# Patient Record
Sex: Female | Born: 1960 | Race: Black or African American | Hispanic: No | Smoking: Former smoker
Health system: Southern US, Community
[De-identification: ages and names within clinical notes are randomized; demographics above are authoritative.]

## PROBLEM LIST (undated history)

## (undated) DIAGNOSIS — M109 Gout, unspecified: Secondary | ICD-10-CM

## (undated) DIAGNOSIS — G4730 Sleep apnea, unspecified: Secondary | ICD-10-CM

## (undated) DIAGNOSIS — K469 Unspecified abdominal hernia without obstruction or gangrene: Secondary | ICD-10-CM

## (undated) DIAGNOSIS — N189 Chronic kidney disease, unspecified: Secondary | ICD-10-CM

## (undated) DIAGNOSIS — E039 Hypothyroidism, unspecified: Secondary | ICD-10-CM

## (undated) DIAGNOSIS — I708 Atherosclerosis of other arteries: Secondary | ICD-10-CM

## (undated) DIAGNOSIS — Z227 Latent tuberculosis: Secondary | ICD-10-CM

## (undated) DIAGNOSIS — I2510 Atherosclerotic heart disease of native coronary artery without angina pectoris: Secondary | ICD-10-CM

## (undated) DIAGNOSIS — R011 Cardiac murmur, unspecified: Secondary | ICD-10-CM

## (undated) DIAGNOSIS — M5136 Other intervertebral disc degeneration, lumbar region: Secondary | ICD-10-CM

## (undated) DIAGNOSIS — E785 Hyperlipidemia, unspecified: Secondary | ICD-10-CM

## (undated) DIAGNOSIS — M1990 Unspecified osteoarthritis, unspecified site: Secondary | ICD-10-CM

## (undated) DIAGNOSIS — I700 Atherosclerosis of aorta: Secondary | ICD-10-CM

## (undated) DIAGNOSIS — I272 Pulmonary hypertension, unspecified: Secondary | ICD-10-CM

## (undated) DIAGNOSIS — I10 Essential (primary) hypertension: Secondary | ICD-10-CM

## (undated) DIAGNOSIS — G459 Transient cerebral ischemic attack, unspecified: Secondary | ICD-10-CM

## (undated) DIAGNOSIS — I639 Cerebral infarction, unspecified: Secondary | ICD-10-CM

## (undated) DIAGNOSIS — K429 Umbilical hernia without obstruction or gangrene: Secondary | ICD-10-CM

## (undated) DIAGNOSIS — E079 Disorder of thyroid, unspecified: Secondary | ICD-10-CM

## (undated) DIAGNOSIS — N186 End stage renal disease: Secondary | ICD-10-CM

## (undated) DIAGNOSIS — D631 Anemia in chronic kidney disease: Secondary | ICD-10-CM

## (undated) DIAGNOSIS — M51369 Other intervertebral disc degeneration, lumbar region without mention of lumbar back pain or lower extremity pain: Secondary | ICD-10-CM

## (undated) DIAGNOSIS — K219 Gastro-esophageal reflux disease without esophagitis: Secondary | ICD-10-CM

## (undated) DIAGNOSIS — Z992 Dependence on renal dialysis: Secondary | ICD-10-CM

## (undated) DIAGNOSIS — R0600 Dyspnea, unspecified: Secondary | ICD-10-CM

## (undated) DIAGNOSIS — D649 Anemia, unspecified: Secondary | ICD-10-CM

## (undated) HISTORY — DX: Hyperlipidemia, unspecified: E78.5

## (undated) HISTORY — DX: Chronic kidney disease, unspecified: N18.9

## (undated) HISTORY — PX: ABDOMINAL HYSTERECTOMY: SHX81

## (undated) HISTORY — DX: Essential (primary) hypertension: I10

## (undated) HISTORY — DX: Disorder of thyroid, unspecified: E07.9

## (undated) HISTORY — PX: HERNIA REPAIR: SHX51

## (undated) HISTORY — DX: Cerebral infarction, unspecified: I63.9

## (undated) HISTORY — DX: End stage renal disease: N18.6

## (undated) HISTORY — DX: Unspecified abdominal hernia without obstruction or gangrene: K46.9

## (undated) HISTORY — PX: PARTIAL HYSTERECTOMY: SHX80

---

## 1985-05-30 DIAGNOSIS — N051 Unspecified nephritic syndrome with focal and segmental glomerular lesions: Secondary | ICD-10-CM

## 1985-05-30 HISTORY — DX: Unspecified nephritic syndrome with focal and segmental glomerular lesions: N05.1

## 2004-09-29 ENCOUNTER — Ambulatory Visit: Payer: Self-pay

## 2004-11-18 ENCOUNTER — Ambulatory Visit: Payer: Self-pay | Admitting: Surgery

## 2007-01-02 ENCOUNTER — Emergency Department: Payer: Self-pay | Admitting: Emergency Medicine

## 2010-10-13 ENCOUNTER — Ambulatory Visit: Payer: Self-pay | Admitting: Internal Medicine

## 2010-11-11 ENCOUNTER — Ambulatory Visit: Payer: Self-pay | Admitting: Internal Medicine

## 2011-09-29 ENCOUNTER — Ambulatory Visit: Payer: Self-pay | Admitting: Family Medicine

## 2012-10-23 ENCOUNTER — Ambulatory Visit: Payer: Self-pay

## 2012-10-30 ENCOUNTER — Ambulatory Visit: Payer: Self-pay

## 2013-01-21 ENCOUNTER — Ambulatory Visit: Payer: Self-pay | Admitting: Internal Medicine

## 2013-04-23 ENCOUNTER — Ambulatory Visit: Payer: Self-pay

## 2013-04-30 ENCOUNTER — Ambulatory Visit: Payer: Self-pay

## 2013-04-30 HISTORY — PX: BREAST BIOPSY: SHX20

## 2013-05-02 ENCOUNTER — Encounter: Payer: Self-pay | Admitting: *Deleted

## 2013-05-16 ENCOUNTER — Other Ambulatory Visit: Payer: PRIVATE HEALTH INSURANCE

## 2013-05-16 ENCOUNTER — Ambulatory Visit (INDEPENDENT_AMBULATORY_CARE_PROVIDER_SITE_OTHER): Payer: PRIVATE HEALTH INSURANCE | Admitting: General Surgery

## 2013-05-16 ENCOUNTER — Encounter: Payer: Self-pay | Admitting: General Surgery

## 2013-05-16 VITALS — BP 140/78 | Ht 61.0 in | Wt 175.0 lb

## 2013-05-16 DIAGNOSIS — N6452 Nipple discharge: Secondary | ICD-10-CM

## 2013-05-16 DIAGNOSIS — D249 Benign neoplasm of unspecified breast: Secondary | ICD-10-CM

## 2013-05-16 DIAGNOSIS — N6459 Other signs and symptoms in breast: Secondary | ICD-10-CM

## 2013-05-16 NOTE — Patient Instructions (Addendum)
Continue self breast exams. Call office for any new breast issues or concerns.  Patient's surgery has been scheduled for 05-27-13 at [facility].

## 2013-05-16 NOTE — Progress Notes (Signed)
Patient ID: Felicia Butler, female   DOB: 10/30/1960, 52 y.o.   MRN: [id number]  Chief Complaint  Patient presents with  . Breast Problem    mammogram    HPI Felicia Butler is a 52 y.o. female.  who presents for a breast evaluation referred by Koren Shiver [facility]. The most recent mammogram was done on 05-01-13.  Patient does perform regular self breast checks and gets regular mammograms done.  She found a lump in her right breast back in October, and bloody to clear nipple discharge. The area was core biopsied to show intraductal papilloma.    HPI  Past Medical History  Diagnosis Date  . Hernia   . Hypertension   . Hyperlipidemia   . Chronic kidney disease   . Thyroid disease     Past Surgical History  Procedure Laterality Date  . Hernia repair  1960's  . Partial hysterectomy    . Breast biopsy Right 04-30-13    intraductal papilloma  . Breast biopsy Right 04-30-13    cyst aspiration    History reviewed. No pertinent family history.  Social History History  Substance Use Topics  . Smoking status: Former Smoker -- 5 years    Quit date: 05/31/1983  . Smokeless tobacco: Not on file  . Alcohol Use: No    No Known Allergies  Current Outpatient Prescriptions  Medication Sig Dispense Refill  . amLODipine-atorvastatin (CADUET) 10-80 MG per tablet Take 1 tablet by mouth daily.      Marland Kitchen levothyroxine (SYNTHROID, LEVOTHROID) 75 MCG tablet Take 75 mcg by mouth daily before breakfast.      . lisinopril (PRINIVIL,ZESTRIL) 10 MG tablet Take 10 mg by mouth 2 (two) times daily.      . metoprolol succinate (TOPROL-XL) 25 MG 24 hr tablet Take 25 mg by mouth daily.       No current facility-administered medications for this visit.    Review of Systems Review of Systems  Constitutional: Negative.   Respiratory: Negative.   Cardiovascular: Negative.     Blood pressure 140/78, height 5\' 1"  (1.549 m), weight 175 lb (79.379 kg).  Physical Exam Physical Exam  Constitutional: She is  oriented to person, place, and time. She appears well-developed and well-nourished.  Eyes: Conjunctivae are normal. No scleral icterus.  Neck: Neck supple.  Cardiovascular: Normal rate, regular rhythm and normal heart sounds.   Pulmonary/Chest: Effort normal and breath sounds normal. Right breast exhibits nipple discharge (clear fluid, it seems to come with pressure at 12 o'clock location). Right breast exhibits no inverted nipple, no mass, no skin change and no tenderness. Left breast exhibits nipple discharge (clear fluid, it seems to come with pressure at 3-4 o'clock location.). Left breast exhibits no inverted nipple, no mass, no skin change and no tenderness.  Lymphadenopathy:    She has no cervical adenopathy.    She has no axillary adenopathy.  Neurological: She is alert and oriented to person, place, and time.  Skin: Skin is warm and dry.    Data Reviewed Mammogram, ultrasound right breast and pathology reviewed. 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     Plan    Bilateral mas excision with US guidance. Discussed fully with pt and she is agreeable.     Patient's surgery has been scheduled for 05-27-13 at [facility].   Lucella Pommier G 05/16/2013, 4:08 PM

## 2013-05-27 ENCOUNTER — Ambulatory Visit: Payer: Self-pay | Admitting: General Surgery

## 2013-05-27 DIAGNOSIS — N6459 Other signs and symptoms in breast: Secondary | ICD-10-CM

## 2013-05-27 DIAGNOSIS — D249 Benign neoplasm of unspecified breast: Secondary | ICD-10-CM

## 2013-05-27 HISTORY — PX: BREAST BIOPSY: SHX20

## 2013-05-28 ENCOUNTER — Telehealth: Payer: Self-pay | Admitting: *Deleted

## 2013-05-28 LAB — PATHOLOGY REPORT

## 2013-05-28 NOTE — Telephone Encounter (Signed)
Out of work note faxed as requested. To [employer]

## 2013-05-28 NOTE — Telephone Encounter (Signed)
Out of work one week.

## 2013-06-03 ENCOUNTER — Encounter: Payer: Self-pay | Admitting: General Surgery

## 2013-06-04 ENCOUNTER — Telehealth: Payer: Self-pay | Admitting: *Deleted

## 2013-06-04 NOTE — Telephone Encounter (Signed)
Message copied by Carson Myrtle on Tue Jun 04, 2013  9:26 AM ------      Message from: Christene Lye      Created: Tue Jun 04, 2013  8:48 AM       Please let pt pt know the pathology was normal. No malignancy. ------

## 2013-06-10 ENCOUNTER — Ambulatory Visit: Payer: PRIVATE HEALTH INSURANCE | Admitting: General Surgery

## 2013-06-13 ENCOUNTER — Telehealth: Payer: Self-pay | Admitting: *Deleted

## 2013-06-13 NOTE — Telephone Encounter (Signed)
Message copied by Carson Myrtle on Thu Jun 13, 2013  1:41 PM ------      Message from: Christene Lye      Created: Tue Jun 04, 2013  8:48 AM       Please let pt pt know the pathology was normal. No malignancy. ------

## 2013-06-20 ENCOUNTER — Emergency Department: Payer: Self-pay | Admitting: Emergency Medicine

## 2013-06-20 NOTE — Telephone Encounter (Signed)
appt is for 06-27-13. I have been unable to contact the patient.

## 2013-06-21 LAB — CBC
HCT: 36.3 % (ref 35.0–47.0)
HGB: 11.7 g/dL — ABNORMAL LOW (ref 12.0–16.0)
MCH: 27 pg (ref 26.0–34.0)
MCHC: 32.3 g/dL (ref 32.0–36.0)
MCV: 83 fL (ref 80–100)
Platelet: 338 10*3/uL (ref 150–440)
RBC: 4.35 10*6/uL (ref 3.80–5.20)
RDW: 14.4 % (ref 11.5–14.5)
WBC: 10 10*3/uL (ref 3.6–11.0)

## 2013-06-21 LAB — COMPREHENSIVE METABOLIC PANEL
ALBUMIN: 3.4 g/dL (ref 3.4–5.0)
ALK PHOS: 142 U/L — AB
ALT: 19 U/L (ref 12–78)
Anion Gap: 8 (ref 7–16)
BUN: 44 mg/dL — ABNORMAL HIGH (ref 7–18)
Bilirubin,Total: 0.2 mg/dL (ref 0.2–1.0)
Calcium, Total: 9.6 mg/dL (ref 8.5–10.1)
Chloride: 108 mmol/L — ABNORMAL HIGH (ref 98–107)
Co2: 22 mmol/L (ref 21–32)
Creatinine: 3.33 mg/dL — ABNORMAL HIGH (ref 0.60–1.30)
EGFR (African American): 17 — ABNORMAL LOW
EGFR (Non-African Amer.): 15 — ABNORMAL LOW
Glucose: 122 mg/dL — ABNORMAL HIGH (ref 65–99)
Osmolality: 288 (ref 275–301)
Potassium: 4.1 mmol/L (ref 3.5–5.1)
SGOT(AST): 20 U/L (ref 15–37)
Sodium: 138 mmol/L (ref 136–145)
TOTAL PROTEIN: 8.2 g/dL (ref 6.4–8.2)

## 2013-06-25 ENCOUNTER — Encounter: Payer: Self-pay | Admitting: General Surgery

## 2013-06-25 ENCOUNTER — Ambulatory Visit: Payer: Self-pay | Admitting: General Surgery

## 2013-06-25 VITALS — BP 140/90 | HR 76 | Temp 96.8°F | Resp 14 | Ht 61.0 in | Wt 174.0 lb

## 2013-06-25 DIAGNOSIS — IMO0002 Reserved for concepts with insufficient information to code with codable children: Secondary | ICD-10-CM

## 2013-06-25 MED ORDER — DOXYCYCLINE HYCLATE 100 MG PO CAPS: 100.0000 mg | ORAL_CAPSULE | Freq: Two times a day (BID) | ORAL | Status: DC

## 2013-06-25 NOTE — Patient Instructions (Signed)
Continue self breast exams. Call office for any new breast issues or concerns. 

## 2013-06-25 NOTE — Progress Notes (Signed)
Patient here today for postoperative visit.  She had bilateral breast subareolar duct excision 05-27-13. States the left breast started draining 06-20-13 and she went to the ED. They placed her on pain medication and antibiotics. The right breast started draining this morning (she bumped up against it accidentally).  There is small amount of fluid pocket under both incisions. With gentle pressure 67ml pus was drained from left breast Culture sent from right breast.  Pathology report showed bilateral intraductal papilloma.  Likely infected seroma both breasts. Appears adequately drained.

## 2013-06-26 LAB — WOUND CULTURE

## 2013-06-27 ENCOUNTER — Ambulatory Visit: Payer: PRIVATE HEALTH INSURANCE | Admitting: General Surgery

## 2013-06-30 LAB — ANAEROBIC AND AEROBIC CULTURE

## 2013-07-01 ENCOUNTER — Encounter: Payer: Self-pay | Admitting: General Surgery

## 2013-07-03 ENCOUNTER — Ambulatory Visit: Payer: PRIVATE HEALTH INSURANCE | Admitting: General Surgery

## 2013-07-09 ENCOUNTER — Ambulatory Visit: Payer: PRIVATE HEALTH INSURANCE | Admitting: General Surgery

## 2013-07-16 ENCOUNTER — Encounter: Payer: Self-pay | Admitting: *Deleted

## 2014-03-31 ENCOUNTER — Encounter: Payer: Self-pay | Admitting: General Surgery

## 2014-09-20 NOTE — Op Note (Signed)
PATIENT NAME:  Felicia Butler, Felicia Butler MR#:  [id number] DATE OF BIRTH:  09/14/1960  DATE OF PROCEDURE:  05/27/2013  PREOPERATIVE DIAGNOSES: Intraductal papilloma right breast. Bloody nipple discharge, left breast.   OPERATION: Excision of subareolar ducts in the right breast. Excision of subareolar ducts, left breast with ultrasound guidance.   SURGEON: Ellwood Sayers, M.D.   ANESTHESIA: General.   COMPLICATIONS: None.   ESTIMATED BLOOD LOSS: Minimal.   DRAINS: None.   DESCRIPTION OF PROCEDURE: The patient was placed in the supine position on the operating table and put to sleep with LMA. Both breasts were prepped and draped out as a sterile field. Timeout procedure was performed. Te right breast was dealt with first. She had a needle core biopsy of a mass adjacent to  a duct showing intraductal papilloma. This was located around 11 to 12 o'clock position in the areolar region. Ultrasound was then performed which showed a dilated duct with the biopsy cavity that was identified. A circumareolar incision was made from 9:00 to 1 o'clock position and carefully the areolar skin was lifted up all the way to the nipple area and the skin and subcutaneous tissue were elevated on the lateral aspect.  The areas of concern were identified and these were circumferentially excised out all the way to the nipple region, and after excision the margins were tagged and sent to pathology fresh. Then, ensuring hemostasis, the deeper tissues were closed with 2-0 Vicryl and the skin closed with subcuticular 4-0 Vicryl. On the left side ultrasound was performed and the drainage appeared to be coming from the 3 to 4 o'clock location where a prominent duct was identified and what looked like a mass adjacent to it. A circumareolar incision was made from the 2 o'clock to the five o'clock position and again the skin was elevated overlying this all the way to the nipple and laterally, and the concerned area containing the duct and the  masslike region was excised out fully. Wound was irrigated and closed with 2-0 Vicryl and deep tissue, and the skin with subcuticular 4-0 Vicryl. Dermabond was applied on both the breast incisions. The patient subsequently was extubated and returned to the recovery room in stable condition.    ____________________________ S.Robinette Haines, MD sgs:sg D: 05/28/2013 08:24:36 ET T: 05/28/2013 08:38:53 ET JOB#: [id number]  cc: Synthia Innocent. Jamal Collin, MD, <Dictator> [facility] Robinette Haines MD ELECTRONICALLY SIGNED 06/05/2013 8:54

## 2014-11-26 IMAGING — CR DG CHEST 1V
1 series · 1 of 1 positions shown · non-contrast
Comparison: none

REASON FOR EXAM: Previous +PPD
COMMENTS:

PROCEDURE:     DXR - DXR CHEST 1 VIEWAP OR PA  - January 21, 2013 [DATE]
RESULT:     Comparison: 09/29/2011

[ap]
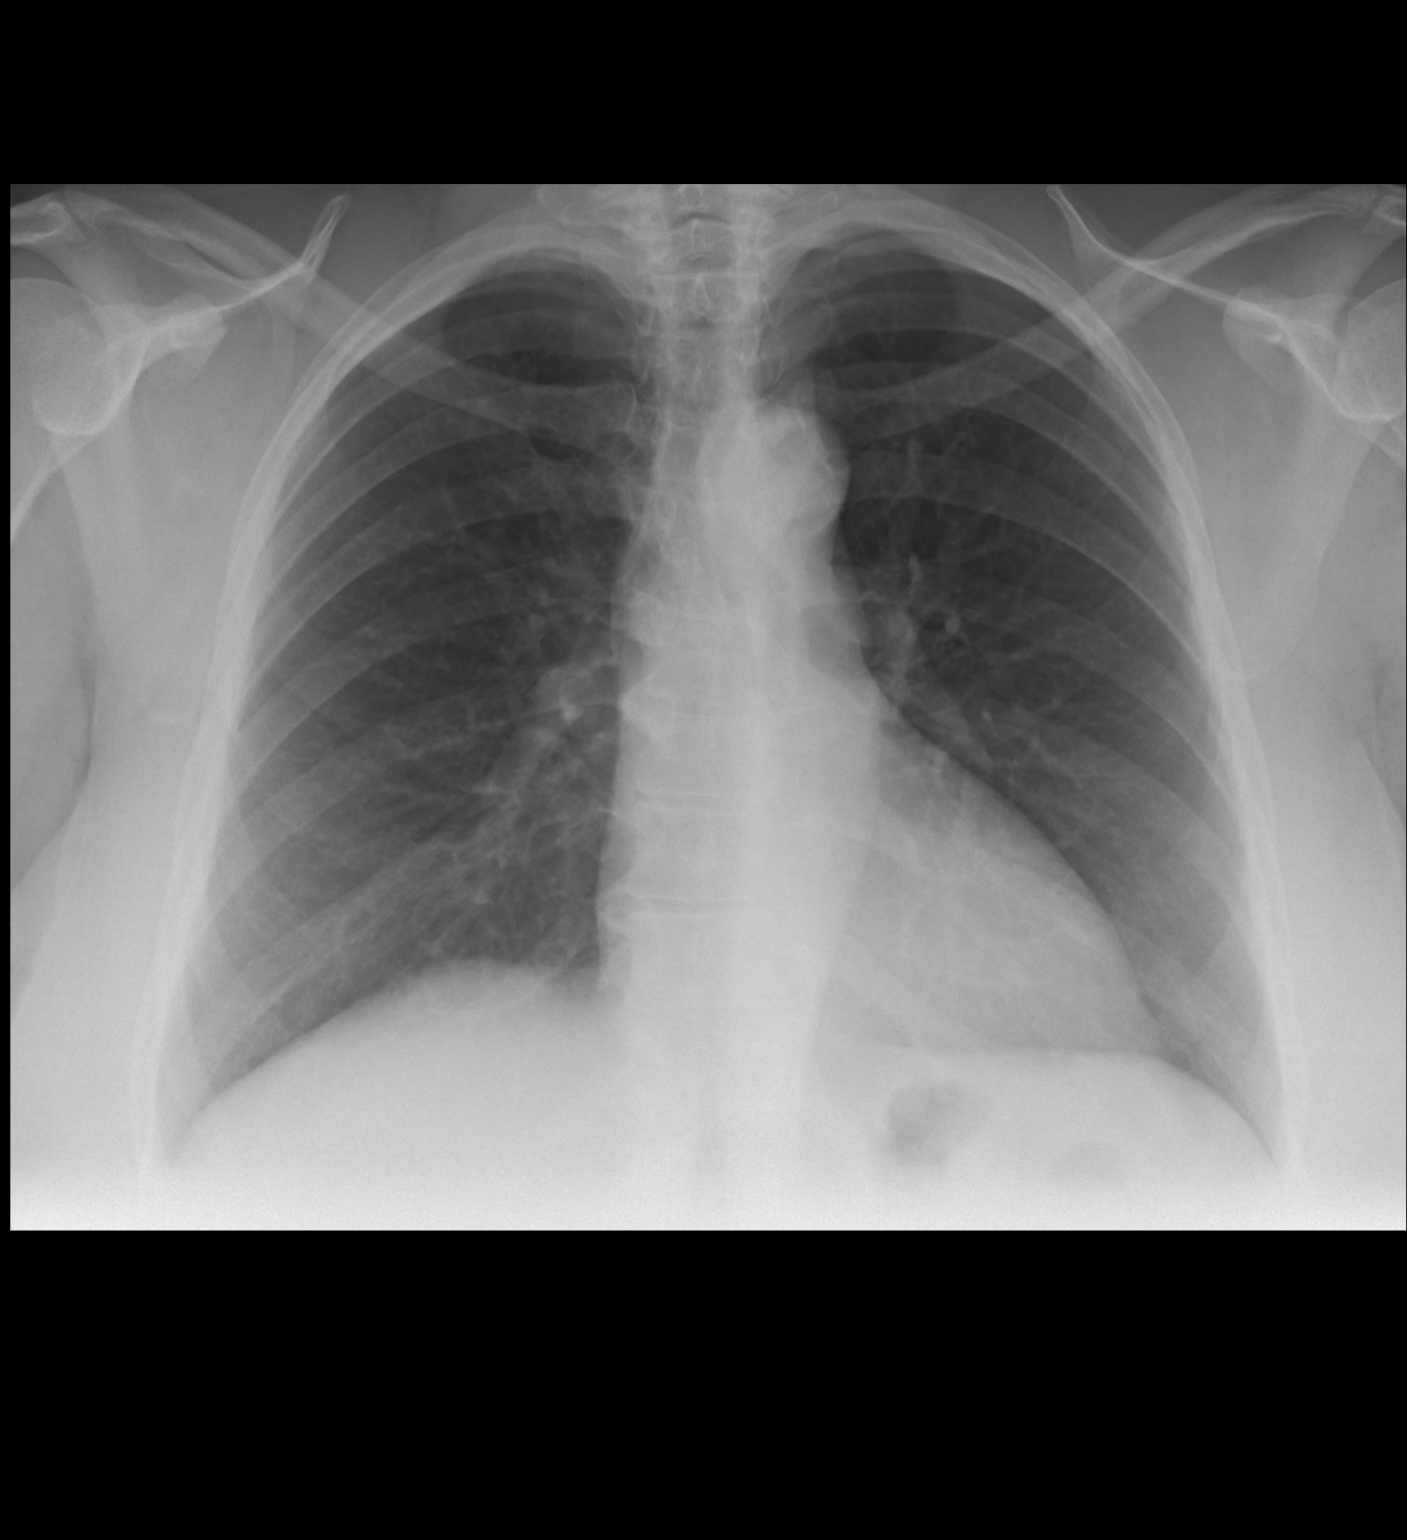

[1 of 1 positions shown; findings below may reference images not displayed]

FINDINGS: Single portable AP chest radiograph is provided.  There is no focal
parenchymal opacity, pleural effusion, or pneumothorax. Normal
cardiomediastinal silhouette. The osseous structures are unremarkable.
IMPRESSION: No acute disease of the che[REDACTED]

## 2015-01-19 ENCOUNTER — Other Ambulatory Visit: Payer: Self-pay

## 2015-01-19 ENCOUNTER — Ambulatory Visit
Admission: RE | Admit: 2015-01-19 | Discharge: 2015-01-19 | Disposition: A | Discharge status: Home / Self Care | Payer: Self-pay | Source: Ambulatory Visit | Attending: Oncology | Admitting: Oncology

## 2015-01-19 ENCOUNTER — Ambulatory Visit: Payer: Self-pay | Attending: Oncology

## 2015-01-19 ENCOUNTER — Encounter (INDEPENDENT_AMBULATORY_CARE_PROVIDER_SITE_OTHER): Payer: Self-pay

## 2015-01-19 VITALS — BP 145/82 | HR 72 | Temp 96.3°F | Resp 18 | Ht 61.0 in | Wt 173.4 lb

## 2015-01-19 DIAGNOSIS — Z0000 Encounter for general adult medical examination without abnormal findings: Secondary | ICD-10-CM | POA: Insufficient documentation

## 2015-01-19 DIAGNOSIS — Z1231 Encounter for screening mammogram for malignant neoplasm of breast: Secondary | ICD-10-CM | POA: Insufficient documentation

## 2015-01-19 DIAGNOSIS — R921 Mammographic calcification found on diagnostic imaging of breast: Secondary | ICD-10-CM | POA: Insufficient documentation

## 2015-01-19 DIAGNOSIS — R920 Mammographic microcalcification found on diagnostic imaging of breast: Secondary | ICD-10-CM

## 2015-01-19 NOTE — Progress Notes (Signed)
Screening mammogram results BIRADS 0, left breast calcifications, order for left breast additional views and left breast ultrasound entered.

## 2015-01-19 NOTE — Progress Notes (Addendum)
Subjective:     Patient ID: Felicia Butler, female   DOB: 02-19-1961, 54 y.o.   MRN: [id number]  HPI   Review of Systems     Objective:   Physical Exam  Pulmonary/Chest: Right breast exhibits no inverted nipple, no mass, no nipple discharge, no skin change and no tenderness. Left breast exhibits no inverted nipple, no mass, no nipple discharge, no skin change and no tenderness. Breasts are symmetrical.    Large pendulous breasts; Bilateral thickening outer quadrants       Assessment:     54 year old patient presents for [facility] clinic visitPatient screened, and meets B[facility]CP eligibility.  Patient does not have insurance, Medicare or Medicaid.  Handout given on Affordable Care Act.CBE unremarkable.   Patient had benign bilateral breast biopsies in 2014 with Dr. Jamal Collin.  Results showed intraductal papillomas.  Bilateral thickening 9 o'clock right breast, and 3 o'clock left breast.  Instructed patient on breast self-exam using teach back method.    Plan:     Sent for bilateral screening mammogram.

## 2015-01-23 ENCOUNTER — Other Ambulatory Visit: Payer: Self-pay

## 2015-01-23 ENCOUNTER — Ambulatory Visit: Payer: Self-pay | Attending: Oncology

## 2015-03-04 ENCOUNTER — Telehealth: Payer: Self-pay | Admitting: *Deleted

## 2015-03-05 IMAGING — US US BREAST CYST ASPIRATION 1ST CYST
1 series · 13 of 13 positions shown · non-contrast
Comparison: Previous exams.

CLINICAL DATA: Complicated right breast cyst.

EXAM:
ULTRASOUND GUIDED right BREAST CYST ASPIRATION

[Series 1: us breast cyst aspiration 1st cyst · 0.12mm/px · 13 of 13 slices shown]
[im 1/13]
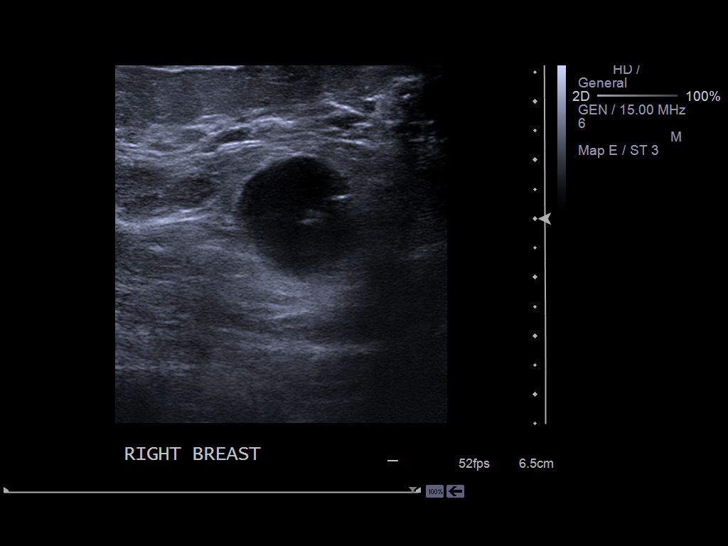
[im 2/13]
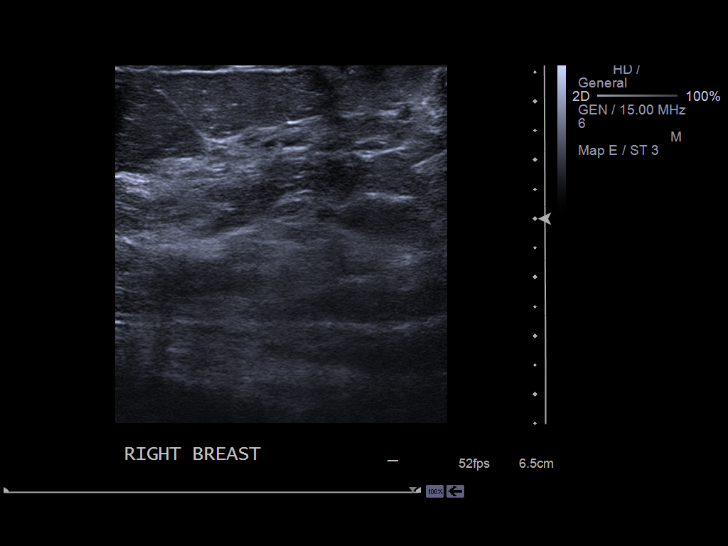
[im 3/13]
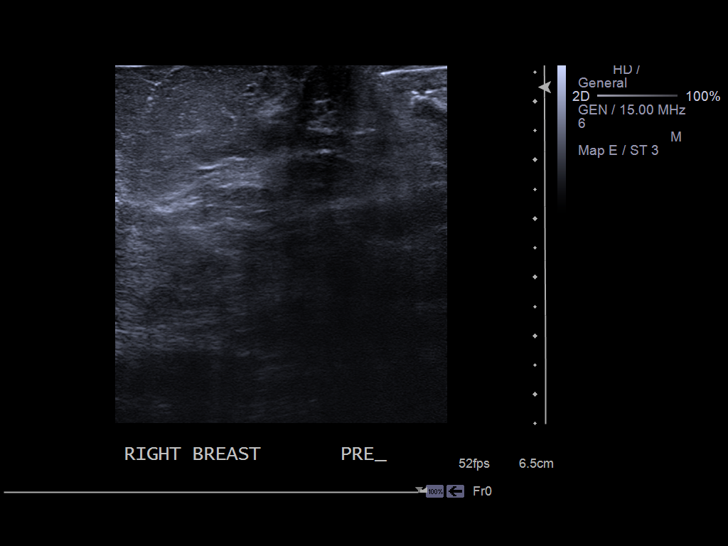
[im 4/13]
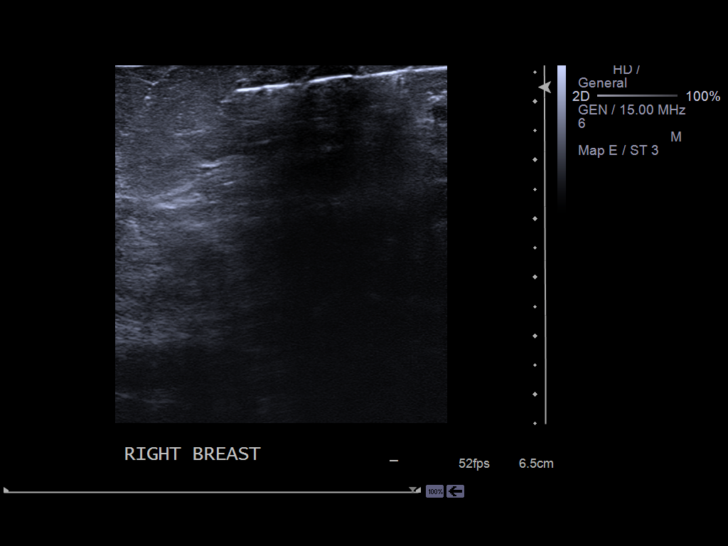
[im 5/13]
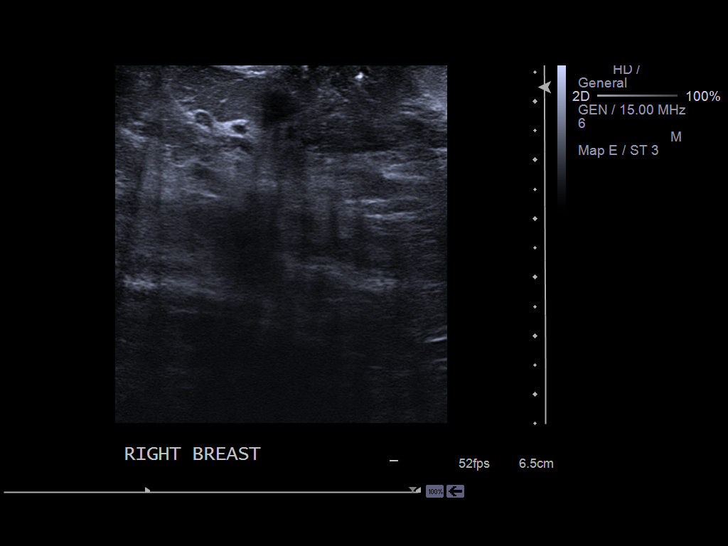
[im 6/13]
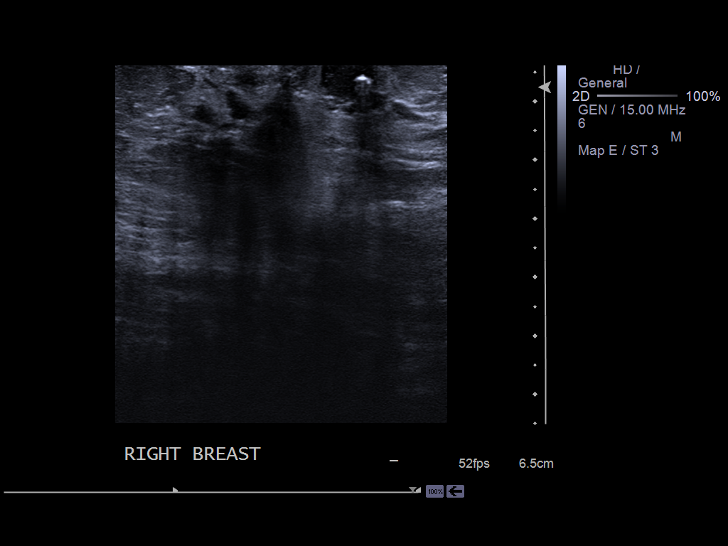
[im 7/13]
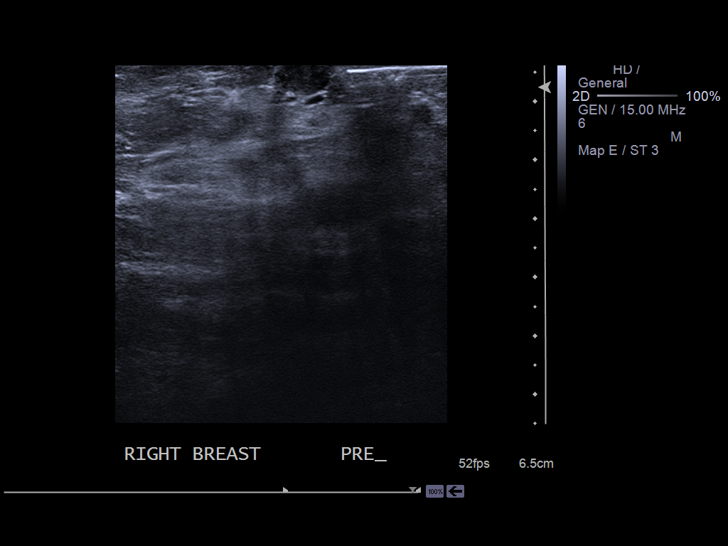
[im 8/13]
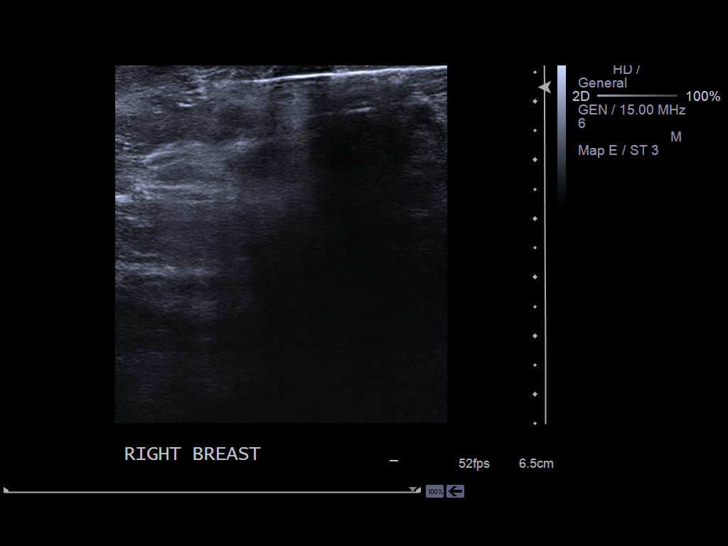
[im 9/13]
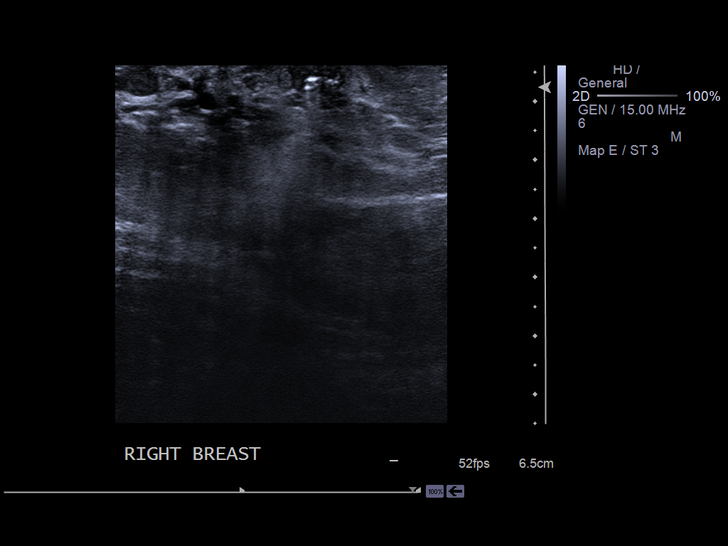
[im 10/13]
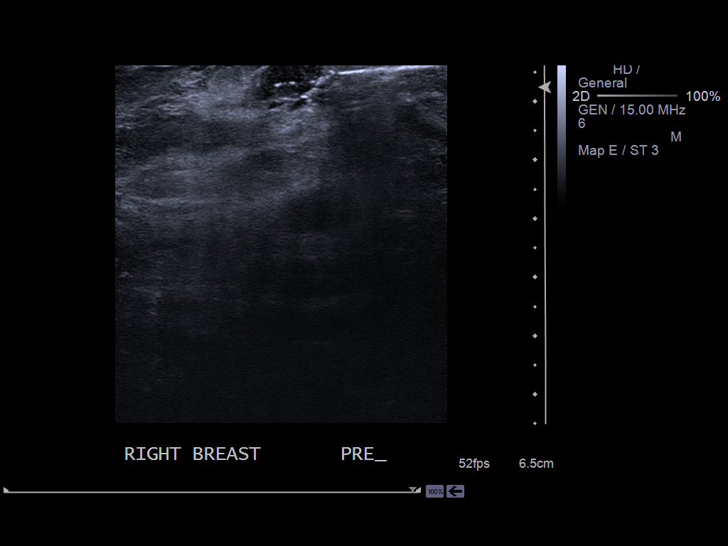
[im 11/13]
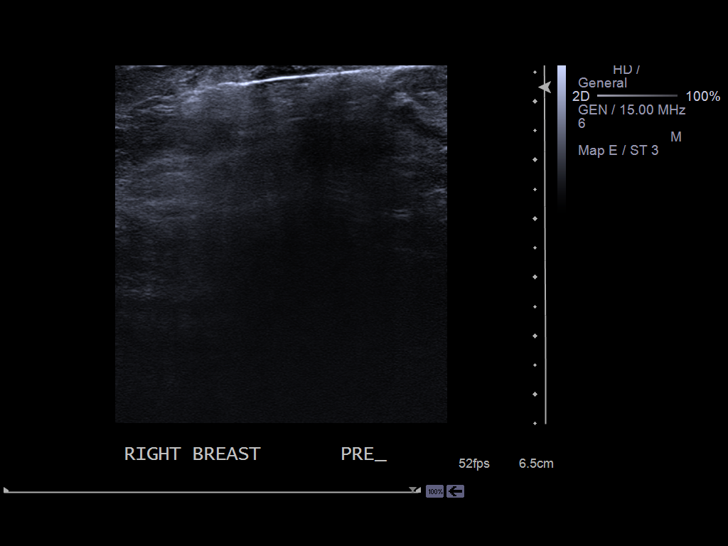
[im 12/13]
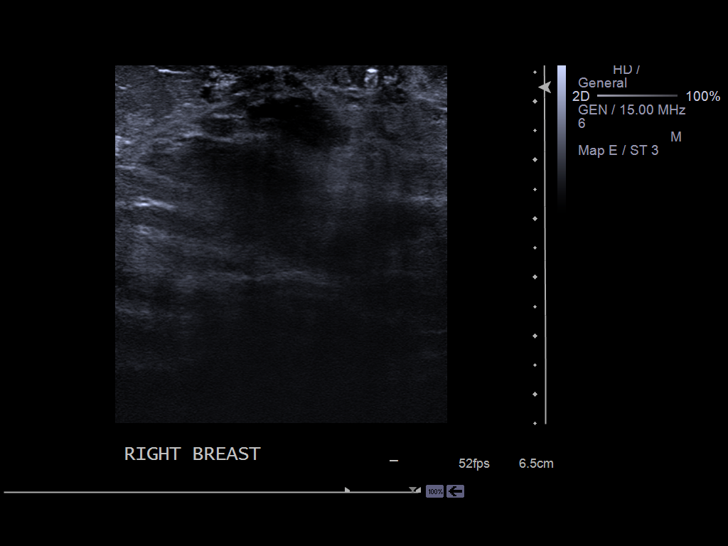
[im 13/13]
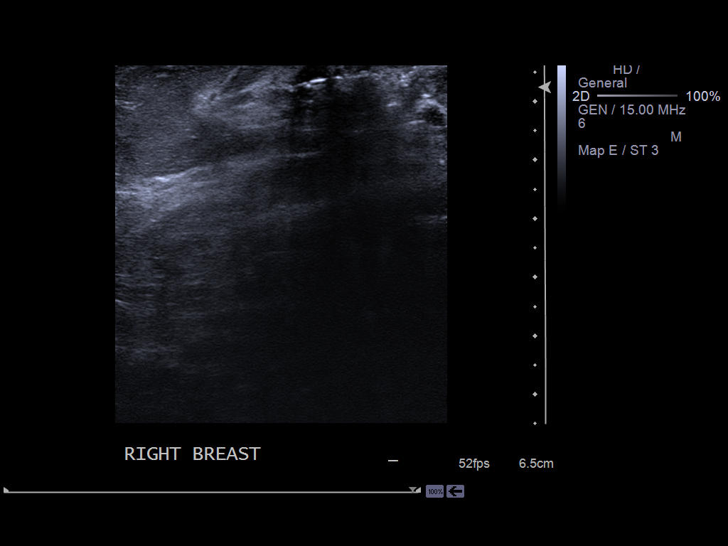

[13 of 13 positions shown; findings below may reference images not displayed]

PROCEDURE:
Using sterile technique, 2% lidocaine, ultrasound guidance, and 18
gauge spinal needle, aspiration was performed of the complicated
cyst located within the right 12 o'clock position 3 cm from the
nipple. 5.5 cc of tan, nonhemorrhagic fluid was aspirated and the
cyst collapsed completely. No fluid was sent for cytology.
IMPRESSION: Ultrasound-guided aspiration of the complicated cyst located within
the right breast as discussed above. No apparent complications.

## 2015-03-05 NOTE — Progress Notes (Signed)
Mailed letter to patient requesting her to call and schedule additional view mammogram.  Multiple phone call attempts have been unsuccessful.

## 2015-04-14 NOTE — Telephone Encounter (Signed)
Encountered closed.

## 2015-04-27 NOTE — Progress Notes (Signed)
Patient did not respond to letter.  Unable to schedule additional views.  Lost to follow-up. Case closed.

## 2015-06-17 ENCOUNTER — Ambulatory Visit: Payer: Self-pay | Attending: Oncology

## 2015-06-17 ENCOUNTER — Other Ambulatory Visit: Payer: Self-pay

## 2015-07-15 ENCOUNTER — Ambulatory Visit: Admission: RE | Admit: 2015-07-15 | Payer: Self-pay | Source: Ambulatory Visit

## 2015-07-15 ENCOUNTER — Other Ambulatory Visit: Payer: Self-pay

## 2016-01-19 ENCOUNTER — Ambulatory Visit: Payer: Self-pay

## 2016-01-19 ENCOUNTER — Ambulatory Visit: Payer: Self-pay | Attending: Oncology

## 2016-08-03 ENCOUNTER — Encounter (INDEPENDENT_AMBULATORY_CARE_PROVIDER_SITE_OTHER): Payer: Self-pay

## 2016-08-03 ENCOUNTER — Ambulatory Visit
Admission: RE | Admit: 2016-08-03 | Discharge: 2016-08-03 | Disposition: A | Discharge status: Home / Self Care | Payer: Self-pay | Source: Ambulatory Visit | Attending: Oncology | Admitting: Oncology

## 2016-08-03 ENCOUNTER — Ambulatory Visit: Payer: Self-pay | Attending: Oncology | Admitting: *Deleted

## 2016-08-03 VITALS — BP 127/72 | HR 87 | Temp 96.6°F | Resp 18 | Ht 61.42 in | Wt 159.7 lb

## 2016-08-03 DIAGNOSIS — R920 Mammographic microcalcification found on diagnostic imaging of breast: Secondary | ICD-10-CM

## 2016-08-03 NOTE — Progress Notes (Signed)
Subjective:     Patient ID: Felicia Butler, female   DOB: 04/08/1961, 56 y.o.   MRN: [id number]  HPI   Review of Systems     Objective:   Physical Exam  Pulmonary/Chest: Right breast exhibits inverted nipple. Right breast exhibits no mass, no nipple discharge, no skin change and no tenderness. Left breast exhibits no inverted nipple, no mass, no nipple discharge, no skin change and no tenderness. Breasts are asymmetrical.    Right nipple inverted.  Patient states normal for her.  Induration noted at the scar site of the left breast.  Left breast 1 cup size larger than the right.  Abdominal:    Genitourinary: No labial fusion. There is no rash, tenderness, lesion or injury on the right labia. There is no rash, tenderness, lesion or injury on the left labia. Right adnexum displays no mass, no tenderness and no fullness. Left adnexum displays no mass, no tenderness and no fullness. No erythema, tenderness or bleeding in the vagina. No foreign body in the vagina. No signs of injury around the vagina. No vaginal discharge found.  Genitourinary Comments: History of hysterectomy for fibroids.  No cervix present.       Assessment:     56 year old Black female returns to [facility] for [facility] screening.  Last mammogram on 01/19/15 was a birads 0.  Patient did not return for additional imaging.  Clinical breast exam with bilateral scars at the areola from previous biopsies.  Pelvic exam without masses or lesions.  No cervix noted which is consistent with a history of a hysterectomy.  Patient has been screened for eligibility.  She does not have any insurance, Medicare or Medicaid.  She also meets financial eligibility.  Hand-out given on the Affordable Care Act.    Plan:     Bilateral diagnostic mammogram and ultrasound ordered for history of calcifications found on 2016 mammogram.  Will follow-up per [facility] protocol.

## 2016-08-04 ENCOUNTER — Other Ambulatory Visit: Payer: Self-pay | Admitting: *Deleted

## 2016-08-04 DIAGNOSIS — R920 Mammographic microcalcification found on diagnostic imaging of breast: Secondary | ICD-10-CM

## 2016-08-04 NOTE — Patient Instructions (Signed)
Gave patient hand-out, Women Staying Healthy, Active and Well from [facility], with education on breast health, pap smears, heart and colon health. 

## 2016-08-11 ENCOUNTER — Ambulatory Visit
Admission: RE | Admit: 2016-08-11 | Discharge: 2016-08-11 | Disposition: A | Discharge status: Home / Self Care | Payer: Self-pay | Source: Ambulatory Visit | Attending: Oncology | Admitting: Oncology

## 2016-08-11 DIAGNOSIS — R920 Mammographic microcalcification found on diagnostic imaging of breast: Secondary | ICD-10-CM

## 2016-08-11 HISTORY — PX: BREAST BIOPSY: SHX20

## 2016-08-12 LAB — SURGICAL PATHOLOGY

## 2016-08-16 ENCOUNTER — Other Ambulatory Visit: Payer: Self-pay | Admitting: *Deleted

## 2016-08-16 DIAGNOSIS — R920 Mammographic microcalcification found on diagnostic imaging of breast: Secondary | ICD-10-CM

## 2016-08-24 ENCOUNTER — Encounter: Payer: Self-pay | Admitting: *Deleted

## 2016-08-24 NOTE — Progress Notes (Signed)
Letter mailed to inform patient of the need for a 6 month follow-up mammogram and her appointment scheduled for 02/13/17 @ 10:20.  HSIS to [place].

## 2016-12-15 ENCOUNTER — Ambulatory Visit (INDEPENDENT_AMBULATORY_CARE_PROVIDER_SITE_OTHER): Payer: PRIVATE HEALTH INSURANCE

## 2016-12-15 ENCOUNTER — Encounter (INDEPENDENT_AMBULATORY_CARE_PROVIDER_SITE_OTHER): Payer: Self-pay | Admitting: Vascular Surgery

## 2016-12-15 ENCOUNTER — Other Ambulatory Visit (INDEPENDENT_AMBULATORY_CARE_PROVIDER_SITE_OTHER): Payer: Self-pay | Admitting: Nephrology

## 2016-12-15 ENCOUNTER — Ambulatory Visit (INDEPENDENT_AMBULATORY_CARE_PROVIDER_SITE_OTHER): Payer: PRIVATE HEALTH INSURANCE | Admitting: Vascular Surgery

## 2016-12-15 DIAGNOSIS — N184 Chronic kidney disease, stage 4 (severe): Secondary | ICD-10-CM | POA: Diagnosis not present

## 2016-12-15 DIAGNOSIS — N189 Chronic kidney disease, unspecified: Secondary | ICD-10-CM

## 2016-12-15 DIAGNOSIS — I10 Essential (primary) hypertension: Secondary | ICD-10-CM

## 2016-12-18 DIAGNOSIS — N189 Chronic kidney disease, unspecified: Secondary | ICD-10-CM | POA: Insufficient documentation

## 2016-12-18 DIAGNOSIS — N289 Disorder of kidney and ureter, unspecified: Secondary | ICD-10-CM | POA: Insufficient documentation

## 2016-12-18 DIAGNOSIS — I10 Essential (primary) hypertension: Secondary | ICD-10-CM | POA: Insufficient documentation

## 2016-12-18 NOTE — Progress Notes (Signed)
MRN : [id number]  Felicia Butler is a 56 y.o. (21-Feb-1961) female who presents with chief complaint of  Chief Complaint  Patient presents with  . New Evaluation    vein mapping  .  History of Present Illness:The patient is seen for evaluation for dialysis access. The patient has chronic renal insufficiency stage IV secondary to hypertension. The patient's most recent creatinine clearance is less than 35. The patient volume status has not yet become an issue. Patient's blood pressures been relatively well controlled. There are mild uremic symptoms which appear to be relatively well tolerated at this time. The patient is right-handed.  The patient has been considering the various methods of dialysis and wishes to proceed with hemodialysis and therefore creation of AV access.  The patient denies amaurosis fugax or recent TIA symptoms. There are no recent neurological changes noted. The patient denies claudication symptoms or rest pain symptoms. The patient denies history of DVT, PE or superficial thrombophlebitis. The patient denies recent episodes of angina or shortness of breath.   Current Meds  Medication Sig  . amLODipine-atorvastatin (CADUET) 10-80 MG per tablet Take 1 tablet by mouth daily.  . cephALEXin (KEFLEX) 500 MG capsule Take 500 mg by mouth 2 (two) times daily.  . hydrALAZINE (APRESOLINE) 50 MG tablet Take 50 mg by mouth 2 (two) times daily.  Marland Kitchen levothyroxine (SYNTHROID, LEVOTHROID) 75 MCG tablet Take 75 mcg by mouth daily before breakfast.  . metoprolol succinate (TOPROL-XL) 25 MG 24 hr tablet Take 25 mg by mouth daily.  . sodium bicarbonate 650 MG tablet Take 1,300 mg by mouth.    Past Medical History:  Diagnosis Date  . Chronic kidney disease   . Hernia   . Hyperlipidemia   . Hypertension   . Thyroid disease     Past Surgical History:  Procedure Laterality Date  . BREAST BIOPSY Right 04-30-13   intraductal papilloma  . BREAST BIOPSY Right 04-30-13   cyst  aspiration  . BREAST BIOPSY Right 05-27-13   subareolar duct excision  . BREAST BIOPSY Left 05-27-13   subareolar duct excision  . BREAST BIOPSY Left 08/11/2016   path pending  . HERNIA REPAIR  1960's  . PARTIAL HYSTERECTOMY      Social History Social History  Substance Use Topics  . Smoking status: Former Smoker    Years: 5.00    Quit date: 05/31/1983  . Smokeless tobacco: Never Used  . Alcohol use No    Family History Family History  Problem Relation Age of Onset  . Cancer Mother   . Diabetes Father   . Stroke Father   No family history of bleeding/clotting disorders, porphyria or autoimmune disease   No Known Allergies   REVIEW OF SYSTEMS (Negative unless checked)  Constitutional: [] Weight loss  [] Fever  [] Chills Cardiac: [] Chest pain   [] Chest pressure   [] Palpitations   [] Shortness of breath when laying flat   [] Shortness of breath with exertion. Vascular:  [] Pain in legs with walking   [] Pain in legs at rest  [] History of DVT   [] Phlebitis   [] Swelling in legs   [] Varicose veins   [] Non-healing ulcers Pulmonary:   [] Uses home oxygen   [] Productive cough   [] Hemoptysis   [] Wheeze  [] COPD   [] Asthma Neurologic:  [] Dizziness   [] Seizures   [] History of stroke   [] History of TIA  [] Aphasia   [] Vissual changes   [] Weakness or numbness in arm   [] Weakness or numbness in leg Musculoskeletal:   []   Joint swelling   [] Joint pain   [] Low back pain Hematologic:  [] Easy bruising  [] Easy bleeding   [] Hypercoagulable state   [] Anemic Gastrointestinal:  [] Diarrhea   [] Vomiting  [] Gastroesophageal reflux/heartburn   [] Difficulty swallowing. Genitourinary:  [x] Chronic kidney disease   [] Difficult urination  [] Frequent urination   [] Blood in urine Skin:  [] Rashes   [] Ulcers  Psychological:  [] History of anxiety   []  History of major depression.  Physical Examination  Vitals:   12/15/16 1127  BP: 140/90  Pulse: 71  Resp: 16  Weight: 161 lb 6.4 oz (73.2 kg)  Height: 5\' 1"  (1.549  m)   Body mass index is 30.5 kg/m. Gen: WD/WN, NAD Head: /AT, No temporalis wasting.  Ear/Nose/Throat: Hearing grossly intact, nares w/o erythema or drainage, poor dentition Eyes: PER, EOMI, sclera nonicteric.  Neck: Supple, no masses.  No bruit or JVD.  Pulmonary:  Good air movement, clear to auscultation bilaterally, no use of accessory muscles.  Cardiac: RRR, normal S1, S2, no Murmurs. Vascular:  Vessel Right Left  Radial Palpable Palpable  Ulnar Palpable Palpable  Brachial Palpable Palpable  Carotid Palpable Palpable  Gastrointestinal: soft, non-distended. No guarding/no peritoneal signs.  Musculoskeletal: M/S 5/5 throughout.  No deformity or atrophy.  Neurologic: CN 2-12 intact. Pain and light touch intact in extremities.  Symmetrical.  Speech is fluent. Motor exam as listed above. Psychiatric: Judgment intact, Mood & affect appropriate for pt's clinical situation. Dermatologic: No rashes or ulcers noted.  No changes consistent with cellulitis. Lymph : No Cervical lymphadenopathy, no lichenification or skin changes of chronic lymphedema.  CBC Lab Results  Component Value Date   WBC 10.0 06/21/2013   HGB 11.7 (L) 06/21/2013   HCT 36.3 06/21/2013   MCV 83 06/21/2013   PLT 338 06/21/2013    BMET    Component Value Date/Time   NA 138 06/21/2013 0048   K 4.1 06/21/2013 0048   CL 108 (H) 06/21/2013 0048   CO2 22 06/21/2013 0048   GLUCOSE 122 (H) 06/21/2013 0048   BUN 44 (H) 06/21/2013 0048   CREATININE 3.33 (H) 06/21/2013 0048   CALCIUM 9.6 06/21/2013 0048   GFRNONAA 15 (L) 06/21/2013 0048   GFRAA 17 (L) 06/21/2013 0048   CrCl cannot be calculated (Patient's most recent lab result is older than the maximum 21 days allowed.).  COAG No results found for: INR, PROTIME  Radiology No results found.  Assessment/Plan 1. Chronic renal impairment, stage 4 (severe) (HCC) Recommend:  At this time the patient does not have extremity access for dialysis  Patient  should have a  Left brachial cephalic fistula created.  The risks, benefits and alternative therapies were reviewed in detail with the patient.  All questions were answered.  The patient agrees to proceed with surgery.   She is do to meet with Dr Juleen China later today and will decide if she will move forward with surgery   A total of 70 minutes was spent with this patient and greater than 50% was spent in counseling and coordination of care with the patient.  Discussion included the treatment options for vascular disease including indications for surgery and intervention.  Also discussed is the appropriate timing of treatment.  In addition medical therapy was discussed.  2. Essential hypertension Continue antihypertensive medications as already ordered, these medications have been reviewed and there are no changes at this time.    [name], MD  12/18/2016 3:27 PM

## 2016-12-30 ENCOUNTER — Telehealth (INDEPENDENT_AMBULATORY_CARE_PROVIDER_SITE_OTHER): Payer: Self-pay

## 2016-12-30 NOTE — Telephone Encounter (Signed)
Patient left a message on 12/29/16 for me to call her, she did not leave a reason. I returned her call today and had to leave a message for her to call back.

## 2017-01-03 ENCOUNTER — Encounter (INDEPENDENT_AMBULATORY_CARE_PROVIDER_SITE_OTHER): Payer: Self-pay

## 2017-01-04 ENCOUNTER — Other Ambulatory Visit (INDEPENDENT_AMBULATORY_CARE_PROVIDER_SITE_OTHER): Payer: Self-pay

## 2017-01-10 ENCOUNTER — Encounter
Admission: RE | Admit: 2017-01-10 | Discharge: 2017-01-10 | Disposition: A | Discharge status: Home / Self Care | Payer: Self-pay | Source: Ambulatory Visit | Attending: Vascular Surgery | Admitting: Vascular Surgery

## 2017-01-10 DIAGNOSIS — N186 End stage renal disease: Secondary | ICD-10-CM | POA: Insufficient documentation

## 2017-01-10 DIAGNOSIS — Z01818 Encounter for other preprocedural examination: Secondary | ICD-10-CM | POA: Insufficient documentation

## 2017-01-10 DIAGNOSIS — R9431 Abnormal electrocardiogram [ECG] [EKG]: Secondary | ICD-10-CM | POA: Insufficient documentation

## 2017-01-10 DIAGNOSIS — I120 Hypertensive chronic kidney disease with stage 5 chronic kidney disease or end stage renal disease: Secondary | ICD-10-CM | POA: Insufficient documentation

## 2017-01-10 HISTORY — DX: Hypothyroidism, unspecified: E03.9

## 2017-01-10 LAB — COMPREHENSIVE METABOLIC PANEL
ALK PHOS: 139 U/L — AB (ref 38–126)
ALT: 12 U/L — AB (ref 14–54)
ANION GAP: 11 (ref 5–15)
AST: 13 U/L — ABNORMAL LOW (ref 15–41)
Albumin: 4.1 g/dL (ref 3.5–5.0)
BILIRUBIN TOTAL: 0.7 mg/dL (ref 0.3–1.2)
BUN: 81 mg/dL — ABNORMAL HIGH (ref 6–20)
CALCIUM: 10 mg/dL (ref 8.9–10.3)
CO2: 16 mmol/L — AB (ref 22–32)
CREATININE: 8.44 mg/dL — AB (ref 0.44–1.00)
Chloride: 113 mmol/L — ABNORMAL HIGH (ref 101–111)
GFR, EST AFRICAN AMERICAN: 5 mL/min — AB (ref >60)
GFR, EST NON AFRICAN AMERICAN: 5 mL/min — AB (ref >60)
Glucose, Bld: 95 mg/dL (ref 65–99)
Potassium: 4.4 mmol/L (ref 3.5–5.1)
Sodium: 140 mmol/L (ref 135–145)
TOTAL PROTEIN: 7.4 g/dL (ref 6.5–8.1)

## 2017-01-10 LAB — PROTIME-INR
INR: 0.95
PROTHROMBIN TIME: 12.7 s (ref 11.4–15.2)

## 2017-01-10 LAB — TYPE AND SCREEN
ABO/RH(D): AB POS
Antibody Screen: NEGATIVE

## 2017-01-10 LAB — SURGICAL PCR SCREEN
MRSA, PCR: NEGATIVE
Staphylococcus aureus: POSITIVE — AB

## 2017-01-10 LAB — APTT: aPTT: 27 seconds (ref 24–36)

## 2017-01-10 NOTE — Patient Instructions (Signed)
  Your procedure is scheduled on:Wednesday January 18, 2017. Report to Same Day Surgery. To find out your arrival time please call [phone] between 1PM - 3PM on Tuesday January 17, 2017.  Remember: Instructions that are not followed completely may result in serious medical risk, up to and including death, or upon the discretion of your surgeon and anesthesiologist your surgery may need to be rescheduled.    _x___ 1. Do not eat food or drink liquids after midnight. No gum chewing or hard candies.     ____ 2. No Alcohol for 24 hours before or after surgery.   ____ 3. Bring all medications with you on the day of surgery if instructed.    __x__ 4. Notify your doctor if there is any change in your medical condition     (cold, fever, infections).    _____ 5. No smoking 24 hours prior to surgery.     Do not wear jewelry, make-up, hairpins, clips or nail polish.  Do not wear lotions, powders, or perfumes.   Do not shave 48 hours prior to surgery. Men may shave face and neck.  Do not bring valuables to the hospital.    [facility] is not responsible for any belongings or valuables.               Contacts, dentures or bridgework may not be worn into surgery.  Leave your suitcase in the car. After surgery it may be brought to your room.  For patients admitted to the hospital, discharge time is determined by your treatment team.   Patients discharged the day of surgery will not be allowed to drive home.    Please read over the following fact sheets that you were given:   [facility] Preparing for Surgery  __x__ Take these medicines the morning of surgery with A SIP OF WATER:    1. hydrALAZINE (APRESOLINE)  2. isosorbide mononitrate (IMDUR)  3. levothyroxine (SYNTHROID, LEVOTHROID  4. metoprolol succinate (TOPROL-XL)    ____ Fleet Enema (as directed)   __x__ Use CHG Soap as directed on instruction sheet  ____ Use inhalers on the day of surgery and bring to hospital day of  surgery  ____ Stop metformin 2 days prior to surgery    ____ Take 1/2 of usual insulin dose the night before surgery and none on the morning of surgery.   ____ Stop Coumadin/Plavix/aspirin on does not apply.  ____ Stop Anti-inflammatories such as Advil, Aleve, Ibuprofen, Motrin, Naproxen,  Naprosyn, Goodies powders or aspirin  products. OK to take Tylenol.   ____ Stop supplements until after surgery.    ____ Bring C-Pap to the hospital.

## 2017-01-10 NOTE — Pre-Procedure Instructions (Signed)
Today's lab results faxed to Dr. Bunnie Domino office for review.  Nasal swab negative for MRSA, positive for Staph. Aureus.  Asked if pt needs treatment for positive Staph. Aureus.

## 2017-01-10 NOTE — Pre-Procedure Instructions (Signed)
EKG REAS BY DR A [facility] AND [place]

## 2017-01-11 NOTE — Pre-Procedure Instructions (Signed)
Received fax from Dr. Bunnie Domino office, no treatment is indicated for positive Staph. Aureus nasal swab.

## 2017-01-17 MED ORDER — CEFAZOLIN SODIUM-DEXTROSE 1-4 GM/50ML-% IV SOLN
1.0000 g | Freq: Once | INTRAVENOUS | Status: AC
  Administered 2017-01-18: 2 g via INTRAVENOUS

## 2017-01-18 ENCOUNTER — Encounter
Admission: RE | Disposition: A | Discharge status: Home / Self Care | Payer: Self-pay | Source: Ambulatory Visit | Attending: Vascular Surgery

## 2017-01-18 ENCOUNTER — Encounter: Payer: Self-pay | Admitting: *Deleted

## 2017-01-18 ENCOUNTER — Ambulatory Visit
Admission: RE | Admit: 2017-01-18 | Discharge: 2017-01-18 | Disposition: A | Discharge status: Home / Self Care | Payer: Self-pay | Source: Ambulatory Visit | Attending: Vascular Surgery | Admitting: Vascular Surgery

## 2017-01-18 ENCOUNTER — Ambulatory Visit: Payer: Self-pay | Admitting: Anesthesiology

## 2017-01-18 DIAGNOSIS — E875 Hyperkalemia: Secondary | ICD-10-CM | POA: Diagnosis not present

## 2017-01-18 DIAGNOSIS — Z87891 Personal history of nicotine dependence: Secondary | ICD-10-CM | POA: Insufficient documentation

## 2017-01-18 DIAGNOSIS — N186 End stage renal disease: Secondary | ICD-10-CM | POA: Diagnosis not present

## 2017-01-18 DIAGNOSIS — E785 Hyperlipidemia, unspecified: Secondary | ICD-10-CM | POA: Insufficient documentation

## 2017-01-18 DIAGNOSIS — K219 Gastro-esophageal reflux disease without esophagitis: Secondary | ICD-10-CM | POA: Insufficient documentation

## 2017-01-18 DIAGNOSIS — E039 Hypothyroidism, unspecified: Secondary | ICD-10-CM | POA: Insufficient documentation

## 2017-01-18 DIAGNOSIS — I10 Essential (primary) hypertension: Secondary | ICD-10-CM | POA: Diagnosis not present

## 2017-01-18 DIAGNOSIS — Z79899 Other long term (current) drug therapy: Secondary | ICD-10-CM | POA: Insufficient documentation

## 2017-01-18 DIAGNOSIS — I120 Hypertensive chronic kidney disease with stage 5 chronic kidney disease or end stage renal disease: Secondary | ICD-10-CM | POA: Insufficient documentation

## 2017-01-18 HISTORY — PX: AV FISTULA PLACEMENT: SHX1204

## 2017-01-18 LAB — ABO/RH: ABO/RH(D): AB POS

## 2017-01-18 SURGERY — ARTERIOVENOUS (AV) FISTULA CREATION
Anesthesia: General | Laterality: Left | Wound class: Clean

## 2017-01-18 MED ORDER — MIDAZOLAM HCL 2 MG/2ML IJ SOLN
INTRAMUSCULAR | Status: DC | PRN
  Administered 2017-01-18: 2 mg via INTRAVENOUS

## 2017-01-18 MED ORDER — PROPOFOL 10 MG/ML IV BOLUS
INTRAVENOUS | Status: AC
  Filled 2017-01-18: qty 20

## 2017-01-18 MED ORDER — FENTANYL CITRATE (PF) 100 MCG/2ML IJ SOLN: 25.0000 ug | INTRAMUSCULAR | Status: DC | PRN

## 2017-01-18 MED ORDER — FENTANYL CITRATE (PF) 100 MCG/2ML IJ SOLN
INTRAMUSCULAR | Status: DC | PRN
  Administered 2017-01-18: 100 ug via INTRAVENOUS

## 2017-01-18 MED ORDER — SODIUM CHLORIDE 0.9 % IV SOLN
INTRAVENOUS | Status: DC | PRN
  Administered 2017-01-18: 50 ug/min via INTRAVENOUS

## 2017-01-18 MED ORDER — DEXAMETHASONE SODIUM PHOSPHATE 10 MG/ML IJ SOLN
INTRAMUSCULAR | Status: AC
  Filled 2017-01-18: qty 1

## 2017-01-18 MED ORDER — ONDANSETRON HCL 4 MG/2ML IJ SOLN: 4.0000 mg | Freq: Once | INTRAMUSCULAR | Status: DC | PRN

## 2017-01-18 MED ORDER — SUGAMMADEX SODIUM 200 MG/2ML IV SOLN
INTRAVENOUS | Status: AC
  Filled 2017-01-18: qty 2

## 2017-01-18 MED ORDER — SUCCINYLCHOLINE CHLORIDE 20 MG/ML IJ SOLN
INTRAMUSCULAR | Status: DC | PRN
  Administered 2017-01-18: 100 mg via INTRAVENOUS

## 2017-01-18 MED ORDER — PHENYLEPHRINE HCL 10 MG/ML IJ SOLN
INTRAMUSCULAR | Status: AC
  Filled 2017-01-18: qty 1

## 2017-01-18 MED ORDER — FAMOTIDINE 20 MG PO TABS
20.0000 mg | ORAL_TABLET | Freq: Once | ORAL | Status: AC
  Administered 2017-01-18: 20 mg via ORAL

## 2017-01-18 MED ORDER — LIDOCAINE HCL (PF) 2 % IJ SOLN
INTRAMUSCULAR | Status: AC
  Filled 2017-01-18: qty 2

## 2017-01-18 MED ORDER — ROCURONIUM BROMIDE 50 MG/5ML IV SOLN
INTRAVENOUS | Status: AC
  Filled 2017-01-18: qty 1

## 2017-01-18 MED ORDER — SODIUM CHLORIDE 0.9 % IV SOLN
INTRAVENOUS | Status: DC
  Administered 2017-01-18: 06:00:00 via INTRAVENOUS

## 2017-01-18 MED ORDER — FENTANYL CITRATE (PF) 100 MCG/2ML IJ SOLN
INTRAMUSCULAR | Status: AC
  Filled 2017-01-18: qty 2

## 2017-01-18 MED ORDER — LIDOCAINE HCL (CARDIAC) 20 MG/ML IV SOLN
INTRAVENOUS | Status: DC | PRN
  Administered 2017-01-18: 100 mg via INTRAVENOUS

## 2017-01-18 MED ORDER — HYDROCODONE-ACETAMINOPHEN 5-325 MG PO TABS: 1.0000 | ORAL_TABLET | Freq: Four times a day (QID) | ORAL | 0 refills | Status: DC | PRN

## 2017-01-18 MED ORDER — FAMOTIDINE 20 MG PO TABS
ORAL_TABLET | ORAL | Status: AC
  Filled 2017-01-18: qty 1

## 2017-01-18 MED ORDER — ONDANSETRON HCL 4 MG/2ML IJ SOLN
INTRAMUSCULAR | Status: DC | PRN
  Administered 2017-01-18: 4 mg via INTRAVENOUS

## 2017-01-18 MED ORDER — PROPOFOL 10 MG/ML IV BOLUS
INTRAVENOUS | Status: DC | PRN
  Administered 2017-01-18: 150 mg via INTRAVENOUS

## 2017-01-18 MED ORDER — ONDANSETRON HCL 4 MG/2ML IJ SOLN
INTRAMUSCULAR | Status: AC
  Filled 2017-01-18: qty 2

## 2017-01-18 MED ORDER — CEFAZOLIN SODIUM-DEXTROSE 1-4 GM/50ML-% IV SOLN
INTRAVENOUS | Status: AC
  Filled 2017-01-18: qty 50

## 2017-01-18 MED ORDER — BUPIVACAINE HCL (PF) 0.5 % IJ SOLN
INTRAMUSCULAR | Status: AC
  Filled 2017-01-18: qty 30

## 2017-01-18 MED ORDER — MIDAZOLAM HCL 2 MG/2ML IJ SOLN
INTRAMUSCULAR | Status: AC
  Filled 2017-01-18: qty 2

## 2017-01-18 MED ORDER — DEXAMETHASONE SODIUM PHOSPHATE 10 MG/ML IJ SOLN
INTRAMUSCULAR | Status: DC | PRN
  Administered 2017-01-18: 10 mg via INTRAVENOUS

## 2017-01-18 MED ORDER — HEPARIN SODIUM (PORCINE) 5000 UNIT/ML IJ SOLN
INTRAMUSCULAR | Status: AC
  Filled 2017-01-18: qty 1

## 2017-01-18 MED ORDER — BUPIVACAINE HCL (PF) 0.5 % IJ SOLN
INTRAMUSCULAR | Status: DC | PRN
  Administered 2017-01-18: 20 mL

## 2017-01-18 MED ORDER — PAPAVERINE HCL 30 MG/ML IJ SOLN
INTRAMUSCULAR | Status: AC
  Filled 2017-01-18: qty 2

## 2017-01-18 MED ORDER — ROCURONIUM BROMIDE 100 MG/10ML IV SOLN
INTRAVENOUS | Status: DC | PRN
  Administered 2017-01-18: 30 mg via INTRAVENOUS
  Administered 2017-01-18: 10 mg via INTRAVENOUS

## 2017-01-18 MED ORDER — SUCCINYLCHOLINE CHLORIDE 20 MG/ML IJ SOLN
INTRAMUSCULAR | Status: AC
  Filled 2017-01-18: qty 1

## 2017-01-18 SURGICAL SUPPLY — 56 items
APPLIER CLIP 11 MED OPEN (CLIP)
APPLIER CLIP 9.375 SM OPEN (CLIP)
BAG DECANTER FOR FLEXI CONT (MISCELLANEOUS) ×3 IMPLANT
BLADE SURG SZ11 CARB STEEL (BLADE) ×3 IMPLANT
BOOT SUTURE AID YELLOW STND (SUTURE) ×3 IMPLANT
BRUSH SCRUB EZ  4% CHG (MISCELLANEOUS) ×2
BRUSH SCRUB EZ 4% CHG (MISCELLANEOUS) ×1 IMPLANT
CANISTER SUCT 1200ML W/VALVE (MISCELLANEOUS) ×3 IMPLANT
CHLORAPREP W/TINT 26ML (MISCELLANEOUS) ×3 IMPLANT
CLIP APPLIE 11 MED OPEN (CLIP) IMPLANT
CLIP APPLIE 9.375 SM OPEN (CLIP) IMPLANT
DERMABOND ADVANCED (GAUZE/BANDAGES/DRESSINGS) ×2
DERMABOND ADVANCED .7 DNX12 (GAUZE/BANDAGES/DRESSINGS) ×1 IMPLANT
DRESSING SURGICEL FIBRLLR 1X2 (HEMOSTASIS) ×1 IMPLANT
DRSG SURGICEL FIBRILLAR 1X2 (HEMOSTASIS) ×3
ELECT CAUTERY BLADE 6.4 (BLADE) ×3 IMPLANT
ELECT REM PT RETURN 9FT ADLT (ELECTROSURGICAL) ×3
ELECTRODE REM PT RTRN 9FT ADLT (ELECTROSURGICAL) ×1 IMPLANT
GEL ULTRASOUND 20GR AQUASONIC (MISCELLANEOUS) IMPLANT
GLOVE BIO SURGEON STRL SZ7 (GLOVE) ×3 IMPLANT
GLOVE INDICATOR 7.5 STRL GRN (GLOVE) ×3 IMPLANT
GLOVE SURG SYN 8.0 (GLOVE) ×3 IMPLANT
GOWN STRL REUS W/ TWL LRG LVL3 (GOWN DISPOSABLE) ×1 IMPLANT
GOWN STRL REUS W/ TWL XL LVL3 (GOWN DISPOSABLE) ×1 IMPLANT
GOWN STRL REUS W/TWL LRG LVL3 (GOWN DISPOSABLE) ×2
GOWN STRL REUS W/TWL XL LVL3 (GOWN DISPOSABLE) ×2
IV NS 500ML (IV SOLUTION) ×2
IV NS 500ML BAXH (IV SOLUTION) ×1 IMPLANT
KIT RM TURNOVER STRD PROC AR (KITS) ×3 IMPLANT
LABEL OR SOLS (LABEL) ×3 IMPLANT
LOOP RED MAXI  1X406MM (MISCELLANEOUS) ×2
LOOP VESSEL MAXI 1X406 RED (MISCELLANEOUS) ×1 IMPLANT
LOOP VESSEL MINI 0.8X406 BLUE (MISCELLANEOUS) ×2 IMPLANT
LOOPS BLUE MINI 0.8X406MM (MISCELLANEOUS) ×4
NEEDLE FILTER BLUNT 18X 1/2SAF (NEEDLE) ×2
NEEDLE FILTER BLUNT 18X1 1/2 (NEEDLE) ×1 IMPLANT
NEEDLE HYPO 30X.5 LL (NEEDLE) IMPLANT
NS IRRIG 500ML POUR BTL (IV SOLUTION) ×3 IMPLANT
PACK EXTREMITY ARMC (MISCELLANEOUS) ×3 IMPLANT
PAD PREP 24X41 OB/GYN DISP (PERSONAL CARE ITEMS) IMPLANT
PUNCH SURGICAL ROTATE 2.7MM (MISCELLANEOUS) IMPLANT
STOCKINETTE STRL 4IN 9604848 (GAUZE/BANDAGES/DRESSINGS) ×3 IMPLANT
SUT MNCRL+ 5-0 UNDYED PC-3 (SUTURE) ×1 IMPLANT
SUT MONOCRYL 5-0 (SUTURE) ×2
SUT PROLENE 6 0 BV (SUTURE) ×12 IMPLANT
SUT SILK 2 0 (SUTURE) ×2
SUT SILK 2-0 18XBRD TIE 12 (SUTURE) ×1 IMPLANT
SUT SILK 3 0 (SUTURE) ×2
SUT SILK 3-0 18XBRD TIE 12 (SUTURE) ×1 IMPLANT
SUT SILK 4 0 (SUTURE) ×2
SUT SILK 4-0 18XBRD TIE 12 (SUTURE) ×1 IMPLANT
SUT VIC AB 3-0 SH 27 (SUTURE) ×2
SUT VIC AB 3-0 SH 27X BRD (SUTURE) ×1 IMPLANT
SYR 20CC LL (SYRINGE) ×3 IMPLANT
SYR 3ML LL SCALE MARK (SYRINGE) ×3 IMPLANT
TOWEL OR 17X26 4PK STRL BLUE (TOWEL DISPOSABLE) IMPLANT

## 2017-01-18 NOTE — Anesthesia Preprocedure Evaluation (Signed)
Anesthesia Evaluation  Patient identified by MRN, date of birth, ID band Patient awake    Reviewed: Allergy & Precautions, NPO status , Patient's Chart, lab work & pertinent test results, reviewed documented beta blocker date and time   Airway Mallampati: II  TM Distance: >3 FB     Dental  (+) Chipped   Pulmonary former smoker,    Pulmonary exam normal        Cardiovascular hypertension, Pt. on medications and Pt. on home beta blockers Normal cardiovascular exam     Neuro/Psych negative neurological ROS  negative psych ROS   GI/Hepatic Neg liver ROS, GERD  Medicated,  Endo/Other  Hypothyroidism   Renal/GU Dialysis and ESRFRenal disease  negative genitourinary   Musculoskeletal negative musculoskeletal ROS (+)   Abdominal Normal abdominal exam  (+)   Peds negative pediatric ROS (+)  Hematology negative hematology ROS (+)   Anesthesia Other Findings   Reproductive/Obstetrics                             Anesthesia Physical Anesthesia Plan  ASA: III  Anesthesia Plan: General   Post-op Pain Management:    Induction: Intravenous  PONV Risk Score and Plan:   Airway Management Planned: Oral ETT  Additional Equipment:   Intra-op Plan:   Post-operative Plan: Extubation in OR  Informed Consent: I have reviewed the patients History and Physical, chart, labs and discussed the procedure including the risks, benefits and alternatives for the proposed anesthesia with the patient or authorized representative who has indicated his/her understanding and acceptance.   Dental advisory given  Plan Discussed with: CRNA and Surgeon  Anesthesia Plan Comments:         Anesthesia Quick Evaluation

## 2017-01-18 NOTE — Anesthesia Procedure Notes (Signed)
Procedure Name: Intubation Date/Time: 01/18/2017 8:12 AM Performed by: Nelda Marseille Pre-anesthesia Checklist: Patient identified, Patient being monitored, Timeout performed, Emergency Drugs available and Suction available Patient Re-evaluated:Patient Re-evaluated prior to induction Oxygen Delivery Method: Circle system utilized Preoxygenation: Pre-oxygenation with 100% oxygen Induction Type: IV induction Ventilation: Mask ventilation without difficulty Laryngoscope Size: Mac, 3 and McGraph Grade View: Grade II Tube type: Oral Tube size: 7.0 mm Number of attempts: 1 Airway Equipment and Method: Stylet and Video-laryngoscopy Placement Confirmation: ETT inserted through vocal cords under direct vision,  positive ETCO2 and breath sounds checked- equal and bilateral Secured at: 21 cm Tube secured with: Tape Dental Injury: Teeth and Oropharynx as per pre-operative assessment

## 2017-01-18 NOTE — H&P (Signed)
Unna boot note recorded in chart

## 2017-01-18 NOTE — Op Note (Signed)
     OPERATIVE NOTE   PROCEDURE: left brachial cephalic arteriovenous fistula placement  PRE-OPERATIVE DIAGNOSIS: End Stage Renal Disease  POST-OPERATIVE DIAGNOSIS: End Stage Renal Disease  SURGEON: [name]  ASSISTANT(S): none  ANESTHESIA: general  ESTIMATED BLOOD LOSS: <50 cc  FINDING(S): 4.0 mm cephalic vein  SPECIMEN(S):  none  INDICATIONS:   Felicia Butler is a 56 y.o. female who presents with end stage renal disease.  The patient is scheduled for left brachiocephalic arteriovenous fistula placement.  The patient is aware the risks include but are not limited to: bleeding, infection, steal syndrome, nerve damage, ischemic monomelic neuropathy, failure to mature, and need for additional procedures.  The patient is aware of the risks of the procedure and elects to proceed forward.  DESCRIPTION: After full informed written consent was obtained from the patient, the patient was brought back to the operating room and placed supine upon the operating table.  Prior to induction, the patient received IV antibiotics.   After obtaining adequate anesthesia, the patient was then prepped and draped in the standard fashion for a left arm access procedure.    A curvilinear incision was then created midway between the radial impulse and the cephalic vein. The cephalic vein was then identified and dissected circumferentially. It was marked with a surgical marker.    Attention was then turned to the brachial artery which was exposed through the same incision and looped proximally and distally. Side branches were controlled with 4-0 silk ties.  The distal segment of the vein was ligated with a  2-0 silk, and the vein was transected.  The proximal segment was interrogated with serial dilators.  The vein accepted up to a 4.00 mm dilator without any difficulty. Heparinized saline was infused into the vein and clamped it with a small bulldog.  At this point, I reset my exposure of the  brachial artery and controlled the artery with vessel loops proximally and distally.  An arteriotomy was then made with a #11 blade, and extended with a Potts scissor.  Heparinized saline was injected proximal and distal into the radial artery.  The vein was then approximated to the artery while the artery was in its native bed and subsequently the vein was beveled using Potts scissors. The vein was then sewn to the artery in an end-to-side configuration with a running stitch of 6-0 Prolene.  Prior to completing this anastomosis Flushing maneuvers were performed and the artery was allowed to forward and back bleed.  There was no evidence of clot from any vessels.  I completed the anastomosis in the usual fashion and then released all vessel loops and clamps.    There was good  thrill in the venous outflow, and there was 1+ palpable radial pulse.  At this point, I irrigated out the surgical wound.  There was no further active bleeding.  The subcutaneous tissue was reapproximated with a running stitch of 3-0 Vicryl.  The skin was then reapproximated with a running subcuticular stitch of 4-0 Vicryl.  The skin was then cleaned, dried, and reinforced with Dermabond.    The patient tolerated this procedure well.   COMPLICATIONS: None  CONDITION: Felicia Butler [facility]  Office: [phone]   01/18/2017, 9:44 AM

## 2017-01-18 NOTE — Discharge Instructions (Signed)

## 2017-01-18 NOTE — Transfer of Care (Signed)
Immediate Anesthesia Transfer of Care Note  Patient: Felicia Butler  Procedure(s) Performed: Procedure(s): ARTERIOVENOUS (AV) FISTULA CREATION ( BRACHIAL CEPHALIC ) (Left)  Patient Location: PACU  Anesthesia Type:General  Level of Consciousness: sedated  Airway & Oxygen Therapy: Patient Spontanous Breathing and Patient connected to face mask oxygen  Post-op Assessment: Report given to RN and Post -op Vital signs reviewed and stable  Post vital signs: Reviewed and stable  Last Vitals:  Vitals:   01/18/17 0603 01/18/17 0940  BP: 137/85 100/65  Pulse: 83 64  Resp: 16 17  Temp: 36.4 C (!) 36.2 C  SpO2: 98% 97%    Last Pain:  Vitals:   01/18/17 0603  TempSrc: Temporal         Complications: No apparent anesthesia complications

## 2017-01-18 NOTE — Anesthesia Post-op Follow-up Note (Signed)
Anesthesia QCDR form completed.        

## 2017-01-18 NOTE — H&P (Signed)
[facility] Admission History & Physical  MRN : [id number]  Felicia Butler is a 56 y.o. (June 01, 1960) female who presents with chief complaint of No chief complaint on file. Marland Kitchen  History of Present Illness:  The patient is seen for evaluation of dialysis access.  The patient has a history of multiple failed accesses.  There have been accesses in both arms and in the thighs.    Current access is via a catheter which is functioning poorly.  There have been several episodes of catheter infection.  The patient denies amaurosis fugax or recent TIA symptoms. There are no recent neurological changes noted. The patient denies claudication symptoms or rest pain symptoms. The patient denies history of DVT, PE or superficial thrombophlebitis. The patient denies recent episodes of angina or shortness of breath.    Current Facility-Administered Medications  Medication Dose Route Frequency Provider Last Rate Last Dose  . 0.9 %  sodium chloride infusion   Intravenous Continuous Rashanna Christiana, Dolores Lory, MD 20 mL/hr at 01/18/17 [phone]    . ceFAZolin (ANCEF) 1-4 GM/50ML-% IVPB           . ceFAZolin (ANCEF) IVPB 1 g/50 mL premix  1 g Intravenous Once Abrahm Mancia, Dolores Lory, MD      . famotidine (PEPCID) 20 MG tablet           . fentaNYL (SUBLIMAZE) injection 25 mcg  25 mcg Intravenous Q5 min PRN Alvin Critchley, MD      . ondansetron [facility]) injection 4 mg  4 mg Intravenous Once PRN Alvin Critchley, MD        Past Medical History:  Diagnosis Date  . Chronic kidney disease   . Hernia   . Hyperlipidemia   . Hypertension   . Hypothyroidism   . Thyroid disease     Past Surgical History:  Procedure Laterality Date  . BREAST BIOPSY Right 04-30-13   intraductal papilloma  . BREAST BIOPSY Right 04-30-13   cyst aspiration  . BREAST BIOPSY Right 05-27-13   subareolar duct excision  . BREAST BIOPSY Left 05-27-13   subareolar duct excision  . BREAST BIOPSY Left 08/11/2016   path pending  .  HERNIA REPAIR  1960's  . PARTIAL HYSTERECTOMY      Social History Social History  Substance Use Topics  . Smoking status: Former Smoker    Years: 5.00    Quit date: 05/31/1983  . Smokeless tobacco: Never Used  . Alcohol use No    Family History Family History  Problem Relation Age of Onset  . Cancer Mother   . Diabetes Father   . Stroke Father     No family history of bleeding or clotting disorders, autoimmune disease or porphyria  No Known Allergies   REVIEW OF SYSTEMS (Negative unless checked)  Constitutional: [] Weight loss  [] Fever  [] Chills Cardiac: [] Chest pain   [] Chest pressure   [] Palpitations   [] Shortness of breath when laying flat   [] Shortness of breath at rest   [x] Shortness of breath with exertion. Vascular:  [] Pain in legs with walking   [] Pain in legs at rest   [] Pain in legs when laying flat   [] Claudication   [] Pain in feet when walking  [] Pain in feet at rest  [] Pain in feet when laying flat   [] History of DVT   [] Phlebitis   [] Swelling in legs   [] Varicose veins   [] Non-healing ulcers Pulmonary:   [] Uses home oxygen   [] Productive cough   [] Hemoptysis   []   Wheeze  [] COPD   [] Asthma Neurologic:  [] Dizziness  [] Blackouts   [] Seizures   [] History of stroke   [] History of TIA  [] Aphasia   [] Temporary blindness   [] Dysphagia   [] Weakness or numbness in arms   [] Weakness or numbness in legs Musculoskeletal:  [] Arthritis   [] Joint swelling   [] Joint pain   [] Low back pain Hematologic:  [] Easy bruising  [] Easy bleeding   [] Hypercoagulable state   [] Anemic  [] Hepatitis Gastrointestinal:  [] Blood in stool   [] Vomiting blood  [] Gastroesophageal reflux/heartburn   [] Difficulty swallowing. Genitourinary:  [x] Chronic kidney disease   [] Difficult urination  [] Frequent urination  [] Burning with urination   [] Blood in urine Skin:  [] Rashes   [] Ulcers   [] Wounds Psychological:  [] History of anxiety   []  History of major depression.  Physical Examination  Vitals:   01/18/17  0603  BP: 137/85  Pulse: 83  Resp: 16  Temp: 97.6 F (36.4 C)  TempSrc: Temporal  SpO2: 98%   There is no height or weight on file to calculate BMI. Gen: WD/WN, NAD Head: [place]/AT, No temporalis wasting. Prominent temp pulse not noted. Ear/Nose/Throat: Hearing grossly intact, nares w/o erythema or drainage, oropharynx w/o Erythema/Exudate,  Eyes: Conjunctiva clear, sclera non-icteric Neck: Trachea midline.  No JVD.  Pulmonary:  Good air movement, respirations not labored, no use of accessory muscles.  Cardiac: RRR, normal S1, S2. Vascular: right IJ tunneled catheter clean dry and intact Vessel Right Left  Radial Palpable Palpable  Ulnar Not Palpable Not Palpable  Brachial Palpable Palpable  Carotid Palpable, without bruit Palpable, without bruit  Gastrointestinal: soft, non-tender/non-distended. No guarding/reflex.  Musculoskeletal: M/S 5/5 throughout.  Extremities without ischemic changes.  No deformity or atrophy.  Neurologic: Sensation grossly intact in extremities.  Symmetrical.  Speech is fluent. Motor exam as listed above. Psychiatric: Judgment intact, Mood & affect appropriate for pt's clinical situation. Dermatologic: No rashes or ulcers noted.  No cellulitis or open wounds. Lymph : No Cervical, Axillary, or Inguinal lymphadenopathy.   CBC Lab Results  Component Value Date   WBC 10.0 06/21/2013   HGB 11.7 (L) 06/21/2013   HCT 36.3 06/21/2013   MCV 83 06/21/2013   PLT 338 06/21/2013    BMET    Component Value Date/Time   NA 140 01/10/2017 0828   NA 138 06/21/2013 0048   K 4.4 01/10/2017 0828   K 4.1 06/21/2013 0048   CL 113 (H) 01/10/2017 0828   CL 108 (H) 06/21/2013 0048   CO2 16 (L) 01/10/2017 0828   CO2 22 06/21/2013 0048   GLUCOSE 95 01/10/2017 0828   GLUCOSE 122 (H) 06/21/2013 0048   BUN 81 (H) 01/10/2017 0828   BUN 44 (H) 06/21/2013 0048   CREATININE 8.44 (H) 01/10/2017 0828   CREATININE 3.33 (H) 06/21/2013 0048   CALCIUM 10.0 01/10/2017 0828    CALCIUM 9.6 06/21/2013 0048   GFRNONAA 5 (L) 01/10/2017 0828   GFRNONAA 15 (L) 06/21/2013 0048   GFRAA 5 (L) 01/10/2017 0828   GFRAA 17 (L) 06/21/2013 0048   Estimated Creatinine Clearance: 6.7 mL/min (A) (by C-G formula based on SCr of 8.44 mg/dL (H)).  COAG Lab Results  Component Value Date   INR 0.95 01/10/2017    Radiology No results found.  Assessment/Plan 1.   End-stage renal disease requiring hemodialysis:  The patient has undergone vein mapping and has adequate vein for a left brachial cephalic fistula. Fistula be created so that we can transition from catheter based access to upper extremity  based access.  Patient will continue dialysis therapy without further interruption if a successful intervention is not achieved then a tunneled catheter will be placed. Dialysis has already been arranged.  The risks and benefits of fistula creation of been reviewed all questions have been answered the patient agrees to proceed with left arm fistula. 2. Hypertension:  Patient will continue medical management; nephrology is following no changes in oral medications. 3.  Hyperlipidemia:  Continue statin as ordered  [name], MD  01/18/2017 7:59 AM

## 2017-01-18 NOTE — Anesthesia Postprocedure Evaluation (Signed)
Anesthesia Post Note  Patient: Felicia Butler  Procedure(s) Performed: Procedure(s) (LRB): ARTERIOVENOUS (AV) FISTULA CREATION ( BRACHIAL CEPHALIC ) (Left)  Patient location during evaluation: PACU Anesthesia Type: General Level of consciousness: awake and alert and oriented Pain management: pain level controlled Vital Signs Assessment: post-procedure vital signs reviewed and stable Respiratory status: spontaneous breathing Cardiovascular status: blood pressure returned to baseline Anesthetic complications: no     Last Vitals:  Vitals:   01/18/17 1044 01/18/17 1112  BP: 111/72 134/84  Pulse: 70 65  Resp: 20 16  Temp: 36.7 C   SpO2: 99% 100%    Last Pain:  Vitals:   01/18/17 1024  TempSrc:   PainSc: 0-No pain                 Raha Tennison

## 2017-02-02 ENCOUNTER — Ambulatory Visit (INDEPENDENT_AMBULATORY_CARE_PROVIDER_SITE_OTHER): Payer: Self-pay | Admitting: Vascular Surgery

## 2017-02-02 ENCOUNTER — Encounter (INDEPENDENT_AMBULATORY_CARE_PROVIDER_SITE_OTHER): Payer: Self-pay | Admitting: Vascular Surgery

## 2017-02-02 VITALS — BP 130/77 | HR 83 | Resp 17 | Wt 162.6 lb

## 2017-02-02 DIAGNOSIS — I10 Essential (primary) hypertension: Secondary | ICD-10-CM

## 2017-02-02 DIAGNOSIS — N184 Chronic kidney disease, stage 4 (severe): Secondary | ICD-10-CM

## 2017-02-02 NOTE — Progress Notes (Signed)
    Patient ID: Felicia Butler, female   DOB: Oct 12, 1960, 56 y.o.   MRN: [id number]  Chief Complaint  Patient presents with  . Follow-up    2week    HPI Felicia Butler is a 56 y.o. female.  The patient is s/p creation of a left brachial cephalic fistula on 5/80.  She denies pain, no fever chills no hand pain   Past Medical History:  Diagnosis Date  . Chronic kidney disease   . Hernia   . Hyperlipidemia   . Hypertension   . Hypothyroidism   . Thyroid disease     Past Surgical History:  Procedure Laterality Date  . AV FISTULA PLACEMENT Left 01/18/2017   Procedure: ARTERIOVENOUS (AV) FISTULA CREATION ( BRACHIAL CEPHALIC );  Surgeon: Katha Cabal, MD;  Location: [facility] ORS;  Service: Vascular;  Laterality: Left;  . BREAST BIOPSY Right 04-30-13   intraductal papilloma  . BREAST BIOPSY Right 04-30-13   cyst aspiration  . BREAST BIOPSY Right 05-27-13   subareolar duct excision  . BREAST BIOPSY Left 05-27-13   subareolar duct excision  . BREAST BIOPSY Left 08/11/2016   path pending  . HERNIA REPAIR  1960's  . PARTIAL HYSTERECTOMY        No Known Allergies  Current Outpatient Prescriptions  Medication Sig Dispense Refill  . amLODipine-atorvastatin (CADUET) 10-80 MG per tablet Take 1 tablet by mouth daily. In evening    . cephALEXin (KEFLEX) 500 MG capsule Take 500 mg by mouth 2 (two) times daily.    . hydrALAZINE (APRESOLINE) 50 MG tablet Take 50 mg by mouth 2 (two) times daily.    Marland Kitchen HYDROcodone-acetaminophen (NORCO) 5-325 MG tablet Take 1-2 tablets by mouth every 6 (six) hours as needed. 50 tablet 0  . isosorbide mononitrate (IMDUR) 30 MG 24 hr tablet Take 30 mg by mouth daily. In am.    . levothyroxine (SYNTHROID, LEVOTHROID) 75 MCG tablet Take 75 mcg by mouth daily before breakfast.    . lisinopril (PRINIVIL,ZESTRIL) 10 MG tablet Take 20 mg by mouth 2 (two) times daily.     . metoprolol succinate (TOPROL-XL) 25 MG 24 hr tablet Take 25 mg by mouth daily. In am.    .  oxyCODONE-acetaminophen (PERCOCET/ROXICET) 5-325 MG per tablet Take 1 tablet by mouth every 4 (four) hours as needed for severe pain.    . sodium bicarbonate 650 MG tablet Take 1,300 mg by mouth daily.      No current facility-administered medications for this visit.         Physical Exam BP 130/77   Pulse 83   Resp 17   Wt 73.8 kg (162 lb 9.6 oz)   LMP 11/18/1996 (Approximate)   BMI 30.72 kg/m  Gen:  WD/WN, NAD Skin: incision C/D/I Left brachial cephalic fistula good thrill good bruit     Assessment/Plan:  1. Chronic renal impairment, stage 4 (severe) (HCC) The patient is s/p successful left arm fistula creation  She will follow up in 4-6 weeks with an HDA  To access when cannulatin can begin  - VAS [place] (AVF,AVG); Future  2. Essential hypertension Continue antihypertensive medications as already ordered, these medications have been reviewed and there are no changes at this time.       Felicia Butler 02/04/2017, 8:45 PM   This note was created with Dragon medical transcription system.  Any errors from dictation are unintentional.

## 2017-02-13 ENCOUNTER — Ambulatory Visit (INDEPENDENT_AMBULATORY_CARE_PROVIDER_SITE_OTHER): Payer: PRIVATE HEALTH INSURANCE | Admitting: Vascular Surgery

## 2017-02-13 ENCOUNTER — Ambulatory Visit: Payer: Self-pay | Attending: Oncology

## 2017-02-13 ENCOUNTER — Ambulatory Visit: Payer: Self-pay

## 2017-02-15 ENCOUNTER — Ambulatory Visit: Payer: Self-pay | Attending: Oncology

## 2017-03-09 ENCOUNTER — Ambulatory Visit (INDEPENDENT_AMBULATORY_CARE_PROVIDER_SITE_OTHER): Payer: Self-pay | Admitting: Vascular Surgery

## 2017-03-09 ENCOUNTER — Encounter (INDEPENDENT_AMBULATORY_CARE_PROVIDER_SITE_OTHER): Payer: Self-pay

## 2017-04-10 DIAGNOSIS — N189 Chronic kidney disease, unspecified: Secondary | ICD-10-CM | POA: Insufficient documentation

## 2017-04-10 DIAGNOSIS — D631 Anemia in chronic kidney disease: Secondary | ICD-10-CM | POA: Insufficient documentation

## 2017-04-10 DIAGNOSIS — L299 Pruritus, unspecified: Secondary | ICD-10-CM | POA: Insufficient documentation

## 2017-04-10 DIAGNOSIS — E1122 Type 2 diabetes mellitus with diabetic chronic kidney disease: Secondary | ICD-10-CM | POA: Insufficient documentation

## 2017-04-10 DIAGNOSIS — R509 Fever, unspecified: Secondary | ICD-10-CM | POA: Insufficient documentation

## 2017-04-10 DIAGNOSIS — Z23 Encounter for immunization: Secondary | ICD-10-CM | POA: Insufficient documentation

## 2017-04-10 DIAGNOSIS — R519 Headache, unspecified: Secondary | ICD-10-CM | POA: Insufficient documentation

## 2017-04-10 DIAGNOSIS — R0602 Shortness of breath: Secondary | ICD-10-CM | POA: Insufficient documentation

## 2017-04-10 DIAGNOSIS — E440 Moderate protein-calorie malnutrition: Secondary | ICD-10-CM | POA: Insufficient documentation

## 2017-04-10 DIAGNOSIS — I129 Hypertensive chronic kidney disease with stage 1 through stage 4 chronic kidney disease, or unspecified chronic kidney disease: Secondary | ICD-10-CM | POA: Insufficient documentation

## 2017-04-10 DIAGNOSIS — D689 Coagulation defect, unspecified: Secondary | ICD-10-CM | POA: Insufficient documentation

## 2017-04-10 DIAGNOSIS — R52 Pain, unspecified: Secondary | ICD-10-CM | POA: Insufficient documentation

## 2017-04-10 DIAGNOSIS — N186 End stage renal disease: Secondary | ICD-10-CM | POA: Insufficient documentation

## 2017-04-10 DIAGNOSIS — R197 Diarrhea, unspecified: Secondary | ICD-10-CM | POA: Insufficient documentation

## 2017-04-10 DIAGNOSIS — D509 Iron deficiency anemia, unspecified: Secondary | ICD-10-CM | POA: Insufficient documentation

## 2017-04-10 DIAGNOSIS — N2581 Secondary hyperparathyroidism of renal origin: Secondary | ICD-10-CM | POA: Insufficient documentation

## 2017-04-17 ENCOUNTER — Encounter (INDEPENDENT_AMBULATORY_CARE_PROVIDER_SITE_OTHER): Payer: Self-pay

## 2017-04-17 ENCOUNTER — Other Ambulatory Visit (INDEPENDENT_AMBULATORY_CARE_PROVIDER_SITE_OTHER): Payer: Self-pay | Admitting: Vascular Surgery

## 2017-04-17 MED ORDER — CEFAZOLIN SODIUM-DEXTROSE 1-4 GM/50ML-% IV SOLN: 1.0000 g | Freq: Once | INTRAVENOUS | Status: DC

## 2017-04-18 ENCOUNTER — Encounter: Payer: Self-pay | Admitting: *Deleted

## 2017-04-18 ENCOUNTER — Ambulatory Visit
Admission: RE | Admit: 2017-04-18 | Discharge: 2017-04-18 | Disposition: A | Discharge status: Home / Self Care | Payer: Medicaid Other | Source: Ambulatory Visit | Attending: Vascular Surgery | Admitting: Vascular Surgery

## 2017-04-18 ENCOUNTER — Encounter
Admission: RE | Disposition: A | Discharge status: Home / Self Care | Payer: Self-pay | Source: Ambulatory Visit | Attending: Vascular Surgery

## 2017-04-18 DIAGNOSIS — N186 End stage renal disease: Secondary | ICD-10-CM

## 2017-04-18 DIAGNOSIS — Z90710 Acquired absence of both cervix and uterus: Secondary | ICD-10-CM | POA: Insufficient documentation

## 2017-04-18 DIAGNOSIS — Z87891 Personal history of nicotine dependence: Secondary | ICD-10-CM | POA: Insufficient documentation

## 2017-04-18 DIAGNOSIS — T82868A Thrombosis of vascular prosthetic devices, implants and grafts, initial encounter: Secondary | ICD-10-CM

## 2017-04-18 DIAGNOSIS — Z809 Family history of malignant neoplasm, unspecified: Secondary | ICD-10-CM | POA: Diagnosis not present

## 2017-04-18 DIAGNOSIS — X58XXXA Exposure to other specified factors, initial encounter: Secondary | ICD-10-CM | POA: Insufficient documentation

## 2017-04-18 DIAGNOSIS — E785 Hyperlipidemia, unspecified: Secondary | ICD-10-CM | POA: Insufficient documentation

## 2017-04-18 DIAGNOSIS — Z823 Family history of stroke: Secondary | ICD-10-CM | POA: Diagnosis not present

## 2017-04-18 DIAGNOSIS — Z992 Dependence on renal dialysis: Secondary | ICD-10-CM | POA: Diagnosis not present

## 2017-04-18 DIAGNOSIS — Z833 Family history of diabetes mellitus: Secondary | ICD-10-CM | POA: Diagnosis not present

## 2017-04-18 DIAGNOSIS — T82858A Stenosis of vascular prosthetic devices, implants and grafts, initial encounter: Secondary | ICD-10-CM | POA: Insufficient documentation

## 2017-04-18 DIAGNOSIS — E039 Hypothyroidism, unspecified: Secondary | ICD-10-CM | POA: Insufficient documentation

## 2017-04-18 DIAGNOSIS — I120 Hypertensive chronic kidney disease with stage 5 chronic kidney disease or end stage renal disease: Secondary | ICD-10-CM | POA: Diagnosis not present

## 2017-04-18 HISTORY — PX: PERIPHERAL VASCULAR THROMBECTOMY: CATH118306

## 2017-04-18 LAB — POTASSIUM (ARMC VASCULAR LAB ONLY): POTASSIUM (ARMC VASCULAR LAB): 4.5 (ref 3.5–5.1)

## 2017-04-18 SURGERY — PERIPHERAL VASCULAR THROMBECTOMY
Anesthesia: Moderate Sedation | Laterality: Left

## 2017-04-18 MED ORDER — FAMOTIDINE 20 MG PO TABS: 40.0000 mg | ORAL_TABLET | ORAL | Status: DC | PRN

## 2017-04-18 MED ORDER — IOPAMIDOL (ISOVUE-300) INJECTION 61%
INTRAVENOUS | Status: DC | PRN
  Administered 2017-04-18: 40 mL via INTRAVENOUS

## 2017-04-18 MED ORDER — HEPARIN SODIUM (PORCINE) 1000 UNIT/ML IJ SOLN
INTRAMUSCULAR | Status: DC | PRN
Start: 2017-04-18 — End: 2017-04-18
  Administered 2017-04-18: 3000 [IU] via INTRAVENOUS

## 2017-04-18 MED ORDER — HYDROMORPHONE HCL 1 MG/ML IJ SOLN: 1.0000 mg | Freq: Once | INTRAMUSCULAR | Status: DC | PRN

## 2017-04-18 MED ORDER — FENTANYL CITRATE (PF) 100 MCG/2ML IJ SOLN
INTRAMUSCULAR | Status: DC | PRN
  Administered 2017-04-18: 50 ug via INTRAVENOUS
  Administered 2017-04-18: 25 ug via INTRAVENOUS

## 2017-04-18 MED ORDER — MIDAZOLAM HCL 5 MG/5ML IJ SOLN
INTRAMUSCULAR | Status: AC
  Filled 2017-04-18: qty 5

## 2017-04-18 MED ORDER — HEPARIN (PORCINE) IN NACL 2-0.9 UNIT/ML-% IJ SOLN
INTRAMUSCULAR | Status: AC
  Filled 2017-04-18: qty 1000

## 2017-04-18 MED ORDER — HEPARIN SODIUM (PORCINE) 1000 UNIT/ML IJ SOLN
INTRAMUSCULAR | Status: AC
  Filled 2017-04-18: qty 1

## 2017-04-18 MED ORDER — LIDOCAINE HCL (PF) 1 % IJ SOLN
INTRAMUSCULAR | Status: DC | PRN
  Administered 2017-04-18: 10 mL via INTRADERMAL

## 2017-04-18 MED ORDER — SODIUM CHLORIDE 0.9 % IV SOLN
INTRAVENOUS | Status: DC
  Administered 2017-04-18: 14:00:00 via INTRAVENOUS

## 2017-04-18 MED ORDER — METHYLPREDNISOLONE SODIUM SUCC 125 MG IJ SOLR: 125.0000 mg | INTRAMUSCULAR | Status: DC | PRN

## 2017-04-18 MED ORDER — DEXTROSE 5 % IV SOLN
1.5000 g | Freq: Once | INTRAVENOUS | Status: AC
  Administered 2017-04-18: 1.5 g via INTRAVENOUS

## 2017-04-18 MED ORDER — MIDAZOLAM HCL 2 MG/2ML IJ SOLN
INTRAMUSCULAR | Status: DC | PRN
  Administered 2017-04-18: 2 mg via INTRAVENOUS
  Administered 2017-04-18: 1 mg via INTRAVENOUS

## 2017-04-18 MED ORDER — FENTANYL CITRATE (PF) 100 MCG/2ML IJ SOLN
INTRAMUSCULAR | Status: AC
  Filled 2017-04-18: qty 2

## 2017-04-18 MED ORDER — ONDANSETRON HCL 4 MG/2ML IJ SOLN: 4.0000 mg | Freq: Four times a day (QID) | INTRAMUSCULAR | Status: DC | PRN

## 2017-04-18 SURGICAL SUPPLY — 18 items
BALLN LUTONIX DCB 7X60X130 (BALLOONS) ×3
BALLOON LUTONIX DCB 7X60X130 (BALLOONS) ×1 IMPLANT
CATH BEACON 5 .035 40 KMP TP (CATHETERS) ×1 IMPLANT
CATH BEACON 5 .038 40 KMP TP (CATHETERS) ×2
COIL EMBL 14X103.2 LOOP (Vascular Products) ×1 IMPLANT
COIL EMBL 14X104.5 LOOP (Vascular Products) IMPLANT
COIL MREYE 5X8 (Vascular Products) ×9 IMPLANT
COIL NESTER 14X10 (Vascular Products) ×2 IMPLANT
DEVICE PRESTO INFLATION (MISCELLANEOUS) ×3 IMPLANT
DRAPE BRACHIAL (DRAPES) ×3 IMPLANT
GUIDEWIRE ANGLED .035 180CM (WIRE) ×3 IMPLANT
NEEDLE ENTRY 21GA 7CM ECHOTIP (NEEDLE) ×3 IMPLANT
PACK ANGIOGRAPHY (CUSTOM PROCEDURE TRAY) ×3 IMPLANT
SET INTRO CAPELLA COAXIAL (SET/KITS/TRAYS/PACK) ×3 IMPLANT
SHEATH BRITE TIP 6FRX5.5 (SHEATH) ×6 IMPLANT
SHIELD RADPAD DADD DRAPE 4X9 (MISCELLANEOUS) ×3 IMPLANT
SUT MNCRL AB 4-0 PS2 18 (SUTURE) ×3 IMPLANT
WIRE MAGIC TORQUE 260C (WIRE) ×3 IMPLANT

## 2017-04-18 NOTE — H&P (Signed)
Felicia Butler Admission History & Physical  MRN : [id number]  Felicia Butler is a 56 y.o. ([birth year]) female who presents with chief complaint of clotted fistula.  History of Present Illness: I am asked to evaluate the patient by the dialysis center. The patient was sent here because they were unable to cannulate the left brachiocephalic fistula last week.  Furthermore the Center states there is no thrill or bruit. The patient states this is the first dialysis run to be missed. This problem is acute in onset and has been present for approximately 7 days. The patient is unaware of any other change.  Patient denies pain or tenderness overlying the access.  There is no pain with dialysis.  The patient denies hand pain or finger pain consistent with steal syndrome.   There have not been any past interventions or declots of this access.  The patient is not chronically hypotensive on dialysis.  Current Facility-Administered Medications  Medication Dose Route Frequency Provider Last Rate Last Dose  . 0.9 %  sodium chloride infusion   Intravenous Continuous Stegmayer, Kimberly A, PA-C      . ceFAZolin (ANCEF) IVPB 1 g/50 mL premix  1 g Intravenous Once Stegmayer, Kimberly A, PA-C      . famotidine (PEPCID) tablet 40 mg  40 mg Oral PRN Stegmayer, Janalyn Harder, PA-C      . HYDROmorphone (DILAUDID) injection 1 mg  1 mg Intravenous Once PRN Stegmayer, Kimberly A, PA-C      . methylPREDNISolone sodium succinate (SOLU-MEDROL) 125 mg/2 mL injection 125 mg  125 mg Intravenous PRN Stegmayer, Kimberly A, PA-C      . ondansetron (ZOFRAN) injection 4 mg  4 mg Intravenous Q6H PRN Stegmayer, Janalyn Harder, PA-C        Past Medical History:  Diagnosis Date  . Chronic kidney disease   . Hernia   . Hyperlipidemia   . Hypertension   . Hypothyroidism   . Thyroid disease     Past Surgical History:  Procedure Laterality Date  . AV FISTULA PLACEMENT Left 01/18/2017   Procedure:  ARTERIOVENOUS (AV) FISTULA CREATION ( BRACHIAL CEPHALIC );  Surgeon: Katha Cabal, MD;  Location: [facility] ORS;  Service: Vascular;  Laterality: Left;  . BREAST BIOPSY Right 04-30-13   intraductal papilloma  . BREAST BIOPSY Right 04-30-13   cyst aspiration  . BREAST BIOPSY Right 05-27-13   subareolar duct excision  . BREAST BIOPSY Left 05-27-13   subareolar duct excision  . BREAST BIOPSY Left 08/11/2016   path pending  . HERNIA REPAIR  1960's  . PARTIAL HYSTERECTOMY      Social History Social History   Tobacco Use  . Smoking status: Former Smoker    Years: 5.00    Last attempt to quit: 05/31/1983    Years since quitting: 33.9  . Smokeless tobacco: Never Used  Substance Use Topics  . Alcohol use: No  . Drug use: No    Family History Family History  Problem Relation Age of Onset  . Cancer Mother   . Diabetes Father   . Stroke Father     No family history of bleeding or clotting disorders, autoimmune disease or porphyria  No Known Allergies   REVIEW OF SYSTEMS (Negative unless checked)  Constitutional: [] Weight loss  [] Fever  [] Chills Cardiac: [] Chest pain   [] Chest pressure   [] Palpitations   [] Shortness of breath when laying flat   [] Shortness of breath at rest   [x] Shortness of breath with exertion.  Vascular:  [] Pain in legs with walking   [] Pain in legs at rest   [] Pain in legs when laying flat   [] Claudication   [] Pain in feet when walking  [] Pain in feet at rest  [] Pain in feet when laying flat   [] History of DVT   [] Phlebitis   [] Swelling in legs   [] Varicose veins   [] Non-healing ulcers Pulmonary:   [] Uses home oxygen   [] Productive cough   [] Hemoptysis   [] Wheeze  [] COPD   [] Asthma Neurologic:  [] Dizziness  [] Blackouts   [] Seizures   [] History of stroke   [] History of TIA  [] Aphasia   [] Temporary blindness   [] Dysphagia   [] Weakness or numbness in arms   [] Weakness or numbness in legs Musculoskeletal:  [] Arthritis   [] Joint swelling   [] Joint pain   [] Low back  pain Hematologic:  [] Easy bruising  [] Easy bleeding   [] Hypercoagulable state   [] Anemic  [] Hepatitis Gastrointestinal:  [] Blood in stool   [] Vomiting blood  [] Gastroesophageal reflux/heartburn   [] Difficulty swallowing. Genitourinary:  [x] Chronic kidney disease   [] Difficult urination  [] Frequent urination  [] Burning with urination   [] Blood in urine Skin:  [] Rashes   [] Ulcers   [] Wounds Psychological:  [] History of anxiety   []  History of major depression.  Physical Examination  There were no vitals filed for this visit. There is no height or weight on file to calculate BMI. Gen: WD/WN, NAD Head: [place]/AT, No temporalis wasting. Prominent temp pulse not noted. Ear/Nose/Throat: Hearing grossly intact, nares w/o erythema or drainage, oropharynx w/o Erythema/Exudate,  Eyes: Conjunctiva clear, sclera non-icteric Neck: Trachea midline.  No JVD.  Pulmonary:  Good air movement, respirations not labored, no use of accessory muscles.  Cardiac: RRR, normal S1, S2. Vascular: Left brachiocephalic fistula no thrill no bruit Vessel Right Left  Radial Palpable Palpable  Ulnar Not Palpable Not Palpable  Brachial Palpable Palpable  Carotid Palpable, without bruit Palpable, without bruit  Gastrointestinal: soft, non-tender/non-distended. No guarding/reflex.  Musculoskeletal: M/S 5/5 throughout.  Extremities without ischemic changes.  No deformity or atrophy.  Neurologic: Sensation grossly intact in extremities.  Symmetrical.  Speech is fluent. Motor exam as listed above. Psychiatric: Judgment intact, Mood & affect appropriate for pt's clinical situation. Dermatologic: No rashes or ulcers noted.  No cellulitis or open wounds. Lymph : No Cervical, Axillary, or Inguinal lymphadenopathy.   CBC Lab Results  Component Value Date   WBC 10.0 06/21/2013   HGB 11.7 (L) 06/21/2013   HCT 36.3 06/21/2013   MCV 83 06/21/2013   PLT 338 06/21/2013    BMET    Component Value Date/Time   NA 140 01/10/2017  0828   NA 138 06/21/2013 0048   K 4.4 01/10/2017 0828   K 4.1 06/21/2013 0048   CL 113 (H) 01/10/2017 0828   CL 108 (H) 06/21/2013 0048   CO2 16 (L) 01/10/2017 0828   CO2 22 06/21/2013 0048   GLUCOSE 95 01/10/2017 0828   GLUCOSE 122 (H) 06/21/2013 0048   BUN 81 (H) 01/10/2017 0828   BUN 44 (H) 06/21/2013 0048   CREATININE 8.44 (H) 01/10/2017 0828   CREATININE 3.33 (H) 06/21/2013 0048   CALCIUM 10.0 01/10/2017 0828   CALCIUM 9.6 06/21/2013 0048   GFRNONAA 5 (L) 01/10/2017 0828   GFRNONAA 15 (L) 06/21/2013 0048   GFRAA 5 (L) 01/10/2017 0828   GFRAA 17 (L) 06/21/2013 0048   CrCl cannot be calculated (Patient's most recent lab result is older than the maximum 21 days allowed.).  COAG Lab Results  Component Value Date   INR 0.95 01/10/2017    Radiology No results found.  Assessment/Plan 1.  Complication dialysis device with thrombosis AV access:  Patient's left brachiocephalic dialysis access is thrombosed. The patient will undergo thrombectomy using interventional techniques.  The risks and benefits were described to the patient.  All questions were answered.  The patient agrees to proceed with angiography and intervention. Potassium will be drawn to ensure that it is an appropriate level prior to performing thrombectomy. 2.  End-stage renal disease requiring hemodialysis:  Patient will continue dialysis therapy without further interruption if a successful thrombectomy is not achieved then catheter will be placed. Dialysis has already been arranged since the patient missed their previous session 3.  Hypertension:  Patient will continue medical management; nephrology is following no changes in oral medications. 4.  Hyperlipidemia:  Continue statin as ordered and reviewed, no changes at this time    [name], MD  04/18/2017 2:00 PM

## 2017-04-18 NOTE — Op Note (Signed)
OPERATIVE NOTE   PROCEDURE: 1. Contrast injection left arm brachiocephalic AV access 2. Percutaneous transluminal angioplasty to 7 mm left arm brachiocephalic AV access 3. Coil embolization large branch of the left brachiocephalic fistula using 2 8 mm coils and one 10 mm coil  PRE-OPERATIVE DIAGNOSIS: Complication of dialysis access                                                       End Stage Renal Disease  POST-OPERATIVE DIAGNOSIS: same as above   SURGEON: Katha Cabal, M.D.  ANESTHESIA: Conscious sedation was administered under my direct supervision by the interventional radiology RN. IV Versed plus fentanyl were utilized. Continuous ECG, pulse oximetry and blood pressure was monitored throughout the entire procedure.  Conscious sedation was for a total of 41.  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 female who  presents with malfunctioning left arm AV access.  The patient is scheduled for angiography with possible intervention of the AV access.  The patient is aware the risks include but are not limited to: bleeding, infection, thrombosis of the cannulated access, and possible anaphylactic reaction to the contrast.  The patient acknowledges if the access can not be salvaged a tunneled catheter will be needed and will be placed during this procedure.  The patient is aware of the risks of the procedure and elects to proceed with the angiogram and intervention.  DESCRIPTION: After full informed written consent was obtained, the patient was brought back to the Special Procedure suite and placed supine position.  Appropriate cardiopulmonary monitors were placed.  The left arm was prepped and draped in the standard fashion.  Appropriate timeout is called.  The brachiocephalic fistula was cannulated with a micropuncture needle.  Cannulation was performed with ultrasound guidance. Ultrasound was placed in a sterile sleeve, the AV access was interrogated and noted to be echolucent and compressible indicating patency. Image was recorded for the permanent record. The puncture is performed under continuous ultrasound visualization.   The microwire was advanced and the needle was exchanged for  a microsheath.  The J-wire was then advanced and a 6 Fr sheath inserted.  Hand injections were completed to image the access from the arterial anastomosis through the entire access.  The central venous structures were also imaged by hand injections.  Based on the images,  3000 units of heparin was given and a wire was negotiated through the stricture within the venous portion of the fistula.  This appears most consistent with a sclerotic valve.  An 7 mm x 60 mm Lutonix drug balloon was used.  Inflation was to 10 atm for 1-1/2 minutes.  Follow-up imaging demonstrates complete resolution of the stricture with rapid flow of contrast through the graft, the central venous anatomy is preserved.  Following the successful angioplasty a Kumpe catheter and Glidewire were then used to select this large branch.  Kumpe was advanced into the branch and hand-injection contrast used to verify the location.  Subsequently 2 8 mm x 5 cm Nester coils were deployed into the branch and then a 10 mm x 14 cm Nester coil  was deployed.  Follow-up imaging demonstrated absence of flow distally.  Injection of contrast through the sheath demonstrated the fistula remained widely patent and free from any impingement by the coils.  A 4-0 Monocryl purse-string suture was sewn around the sheath.  The sheath was removed and light pressure was applied.  A sterile bandage was applied to the puncture site.    COMPLICATIONS: None  CONDITION: Carlynn Purl, M.D [facility] Office:  [phone]  04/18/2017 4:37 PM

## 2017-04-19 ENCOUNTER — Encounter: Payer: Self-pay | Admitting: Vascular Surgery

## 2017-04-30 NOTE — Progress Notes (Deleted)
MRN : [id number]  Felicia Butler is a 56 y.o. (1961-05-01) female who presents with chief complaint of No chief complaint on file. Marland Kitchen  History of Present Illness: The patient returns to the office for followup status post intervention of the left brachiocephalic dialysis access on 04/18/2017. At that time she underwent a PTA  And coil embolization.  Following the intervention the access function has significantly improved, with better flow rates and improved KT/V. The patient has not been experiencing increased bleeding times following decannulation and the patient denies increased recirculation. The patient denies an increase in arm swelling. At the present time the patient denies hand pain.  The patient denies amaurosis fugax or recent TIA symptoms. There are no recent neurological changes noted. The patient denies claudication symptoms or rest pain symptoms. The patient denies history of DVT, PE or superficial thrombophlebitis. The patient denies recent episodes of angina or shortness of breath.        No outpatient medications have been marked as taking for the 05/01/17 encounter (Appointment) with Delana Meyer, Dolores Lory, MD.    Past Medical History:  Diagnosis Date  . Chronic kidney disease   . Hernia   . Hyperlipidemia   . Hypertension   . Hypothyroidism   . Thyroid disease     Past Surgical History:  Procedure Laterality Date  . AV FISTULA PLACEMENT Left 01/18/2017   Procedure: ARTERIOVENOUS (AV) FISTULA CREATION ( BRACHIAL CEPHALIC );  Surgeon: Katha Cabal, MD;  Location: [facility] ORS;  Service: Vascular;  Laterality: Left;  . BREAST BIOPSY Right 04-30-13   intraductal papilloma  . BREAST BIOPSY Right 04-30-13   cyst aspiration  . BREAST BIOPSY Right 05-27-13   subareolar duct excision  . BREAST BIOPSY Left 05-27-13   subareolar duct excision  . BREAST BIOPSY Left 08/11/2016   path pending  . HERNIA REPAIR  1960's  . PARTIAL HYSTERECTOMY    . PERIPHERAL VASCULAR  THROMBECTOMY Left 04/18/2017   Procedure: PERIPHERAL VASCULAR THROMBECTOMY;  Surgeon: Katha Cabal, MD;  Location: [facility];  Service: Cardiovascular;  Laterality: Left;    Social History Social History   Tobacco Use  . Smoking status: Former Smoker    Years: 5.00    Last attempt to quit: 05/31/1983    Years since quitting: 33.9  . Smokeless tobacco: Never Used  Substance Use Topics  . Alcohol use: No  . Drug use: No    Family History Family History  Problem Relation Age of Onset  . Cancer Mother   . Diabetes Father   . Stroke Father     No Known Allergies   REVIEW OF SYSTEMS (Negative unless checked)  Constitutional: [] Weight loss  [] Fever  [] Chills Cardiac: [] Chest pain   [] Chest pressure   [] Palpitations   [] Shortness of breath when laying flat   [] Shortness of breath with exertion. Vascular:  [] Pain in legs with walking   [] Pain in legs at rest  [] History of DVT   [] Phlebitis   [] Swelling in legs   [] Varicose veins   [] Non-healing ulcers Pulmonary:   [] Uses home oxygen   [] Productive cough   [] Hemoptysis   [] Wheeze  [] COPD   [] Asthma Neurologic:  [] Dizziness   [] Seizures   [] History of stroke   [] History of TIA  [] Aphasia   [] Vissual changes   [] Weakness or numbness in arm   [] Weakness or numbness in leg Musculoskeletal:   [] Joint swelling   [] Joint pain   [] Low back pain Hematologic:  [] Easy  bruising  [] Easy bleeding   [] Hypercoagulable state   [] Anemic Gastrointestinal:  [] Diarrhea   [] Vomiting  [] Gastroesophageal reflux/heartburn   [] Difficulty swallowing. Genitourinary:  [] Chronic kidney disease   [] Difficult urination  [] Frequent urination   [] Blood in urine Skin:  [] Rashes   [] Ulcers  Psychological:  [] History of anxiety   []  History of major depression.  Physical Examination  There were no vitals filed for this visit. There is no height or weight on file to calculate BMI. Gen: WD/WN, NAD Head: [place]/AT, No temporalis wasting.  Ear/Nose/Throat:  Hearing grossly intact, nares w/o erythema or drainage Eyes: PER, EOMI, sclera nonicteric.  Neck: Supple, no large masses.   Pulmonary:  Good air movement, no audible wheezing bilaterally, no use of accessory muscles.  Cardiac: RRR, no JVD Vascular: *** Vessel Right Left  Radial Palpable Palpable  Ulnar Palpable Palpable  Brachial Palpable Palpable  Carotid Palpable Palpable  Femoral Palpable Palpable  Popliteal Palpable Palpable  PT Palpable Palpable  DP Palpable Palpable  Gastrointestinal: Non-distended. No guarding/no peritoneal signs.  Musculoskeletal: M/S 5/5 throughout.  No deformity or atrophy.  Neurologic: CN 2-12 intact. Symmetrical.  Speech is fluent. Motor exam as listed above. Psychiatric: Judgment intact, Mood & affect appropriate for pt's clinical situation. Dermatologic: No rashes or ulcers noted.  No changes consistent with cellulitis. Lymph : No lichenification or skin changes of chronic lymphedema.  CBC Lab Results  Component Value Date   WBC 10.0 06/21/2013   HGB 11.7 (L) 06/21/2013   HCT 36.3 06/21/2013   MCV 83 06/21/2013   PLT 338 06/21/2013    BMET    Component Value Date/Time   NA 140 01/10/2017 0828   NA 138 06/21/2013 0048   K 4.4 01/10/2017 0828   K 4.1 06/21/2013 0048   CL 113 (H) 01/10/2017 0828   CL 108 (H) 06/21/2013 0048   CO2 16 (L) 01/10/2017 0828   CO2 22 06/21/2013 0048   GLUCOSE 95 01/10/2017 0828   GLUCOSE 122 (H) 06/21/2013 0048   BUN 81 (H) 01/10/2017 0828   BUN 44 (H) 06/21/2013 0048   CREATININE 8.44 (H) 01/10/2017 0828   CREATININE 3.33 (H) 06/21/2013 0048   CALCIUM 10.0 01/10/2017 0828   CALCIUM 9.6 06/21/2013 0048   GFRNONAA 5 (L) 01/10/2017 0828   GFRNONAA 15 (L) 06/21/2013 0048   GFRAA 5 (L) 01/10/2017 0828   GFRAA 17 (L) 06/21/2013 0048   CrCl cannot be calculated (Patient's most recent lab result is older than the maximum 21 days allowed.).  COAG Lab Results  Component Value Date   INR 0.95 01/10/2017     Radiology No results found.  Outside Studies/Documentation *** pages of outside documents were reviewed.  They showed ***.  Assessment/Plan There are no diagnoses linked to this encounter.   [name], MD  04/30/2017 7:35 PM

## 2017-05-01 ENCOUNTER — Ambulatory Visit (INDEPENDENT_AMBULATORY_CARE_PROVIDER_SITE_OTHER): Payer: Self-pay | Admitting: Vascular Surgery

## 2017-05-04 ENCOUNTER — Ambulatory Visit (INDEPENDENT_AMBULATORY_CARE_PROVIDER_SITE_OTHER): Payer: Self-pay | Admitting: Vascular Surgery

## 2017-05-04 ENCOUNTER — Encounter (INDEPENDENT_AMBULATORY_CARE_PROVIDER_SITE_OTHER): Payer: Self-pay

## 2017-06-05 ENCOUNTER — Encounter (INDEPENDENT_AMBULATORY_CARE_PROVIDER_SITE_OTHER): Payer: Self-pay

## 2017-06-05 ENCOUNTER — Ambulatory Visit (INDEPENDENT_AMBULATORY_CARE_PROVIDER_SITE_OTHER): Payer: Self-pay | Admitting: Vascular Surgery

## 2018-02-08 ENCOUNTER — Other Ambulatory Visit: Payer: Self-pay | Admitting: Internal Medicine

## 2018-02-08 DIAGNOSIS — Z1231 Encounter for screening mammogram for malignant neoplasm of breast: Secondary | ICD-10-CM

## 2018-02-16 ENCOUNTER — Other Ambulatory Visit: Payer: Self-pay

## 2018-03-06 ENCOUNTER — Other Ambulatory Visit: Payer: Self-pay

## 2018-06-08 IMAGING — MG MM DIGITAL DIAGNOSTIC BILAT W/ TOMO W/ CAD
1 series · 2 of 5 positions shown · non-contrast
Comparison: Previous exam(s).

CLINICAL DATA: Callback from screening mammogram in December 2014 for
left breast calcifications. The patient did not return for
evaluation until today.

EXAM:
2D DIGITAL DIAGNOSTIC BILATERAL MAMMOGRAM WITH CAD AND ADJUNCT TOMO

[R CC tomo · 2 of 57 frames shown]
[frame 19/57]
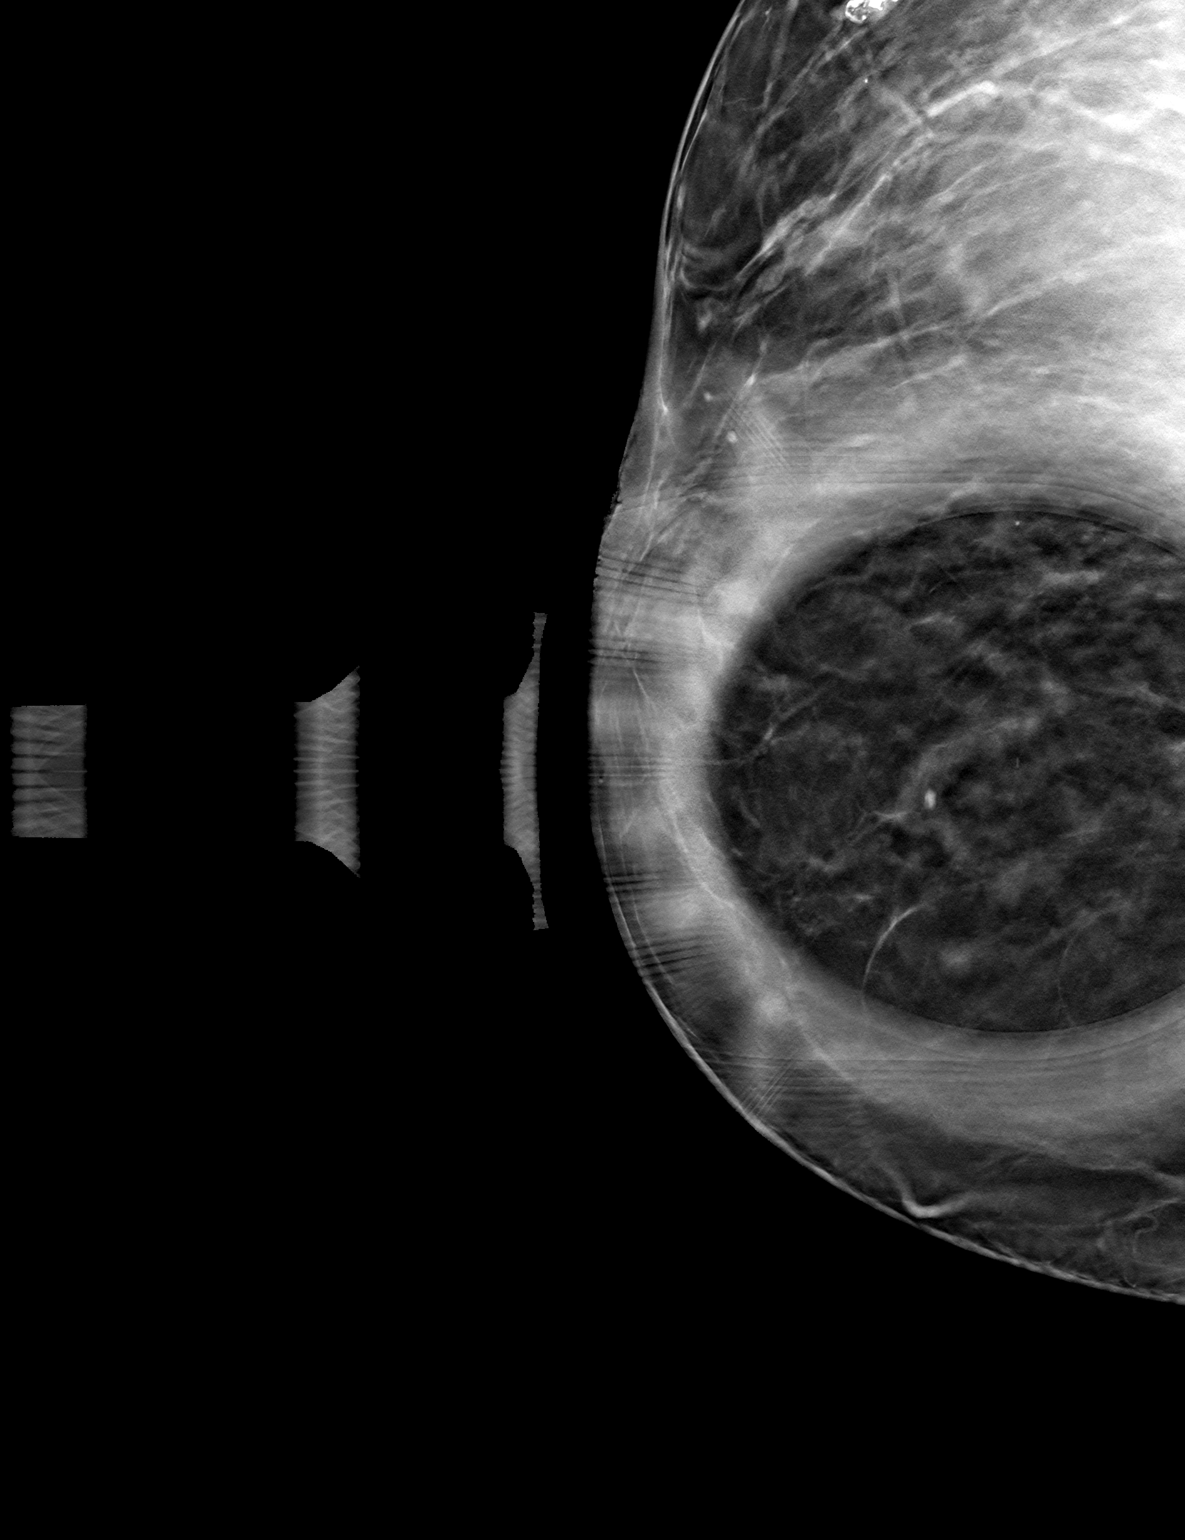
[frame 29/57]
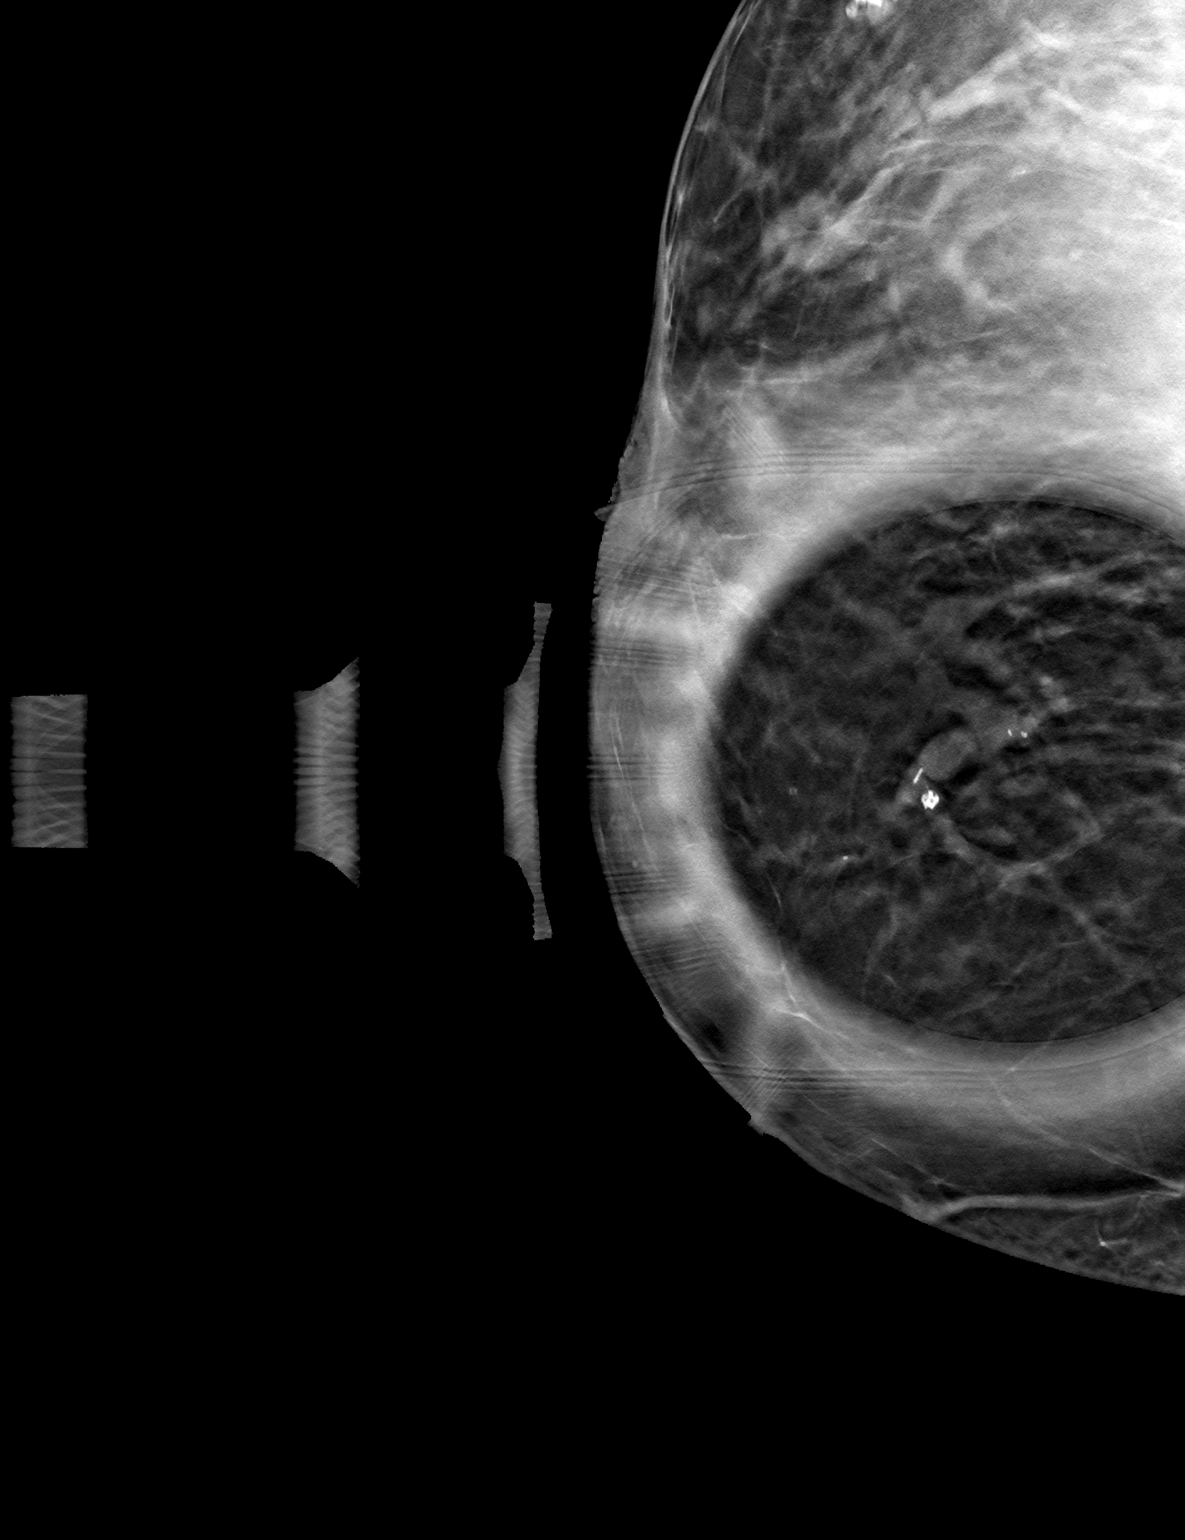

[2 of 5 positions shown; findings below may reference images not displayed]

ACR Breast Density Category c: The breast tissue is heterogeneously
dense, which may obscure small masses.
FINDINGS: No suspicious mass, calcifications, or other abnormality is
identified within the right breast. Within the upper, inner left
breast, there are coarse heterogeneous calcifications in a segmental
distribution spanning approximately 9 x 8.4 x 5.4 cm.

Mammographic images were processed with CAD.
IMPRESSION: Indeterminate left breast calcifications.

RECOMMENDATION:
Stereotactic guided biopsy of the left breast calcifications.

I have discussed the findings and recommendations with the patient.
Results were also provided in writing at the conclusion of the
visit. If applicable, a reminder letter will be sent to the patient
regarding the next appointment.

BI-RADS CATEGORY  4: Suspicious.

## 2018-06-28 HISTORY — PX: COLONOSCOPY: SHX174

## 2018-08-08 DIAGNOSIS — E875 Hyperkalemia: Secondary | ICD-10-CM | POA: Insufficient documentation

## 2018-10-15 DIAGNOSIS — U071 COVID-19: Secondary | ICD-10-CM | POA: Insufficient documentation

## 2019-09-03 ENCOUNTER — Ambulatory Visit (INDEPENDENT_AMBULATORY_CARE_PROVIDER_SITE_OTHER): Payer: Medicare Other | Admitting: Nurse Practitioner

## 2020-02-25 ENCOUNTER — Other Ambulatory Visit (INDEPENDENT_AMBULATORY_CARE_PROVIDER_SITE_OTHER): Payer: Self-pay | Admitting: Vascular Surgery

## 2020-02-26 ENCOUNTER — Other Ambulatory Visit (INDEPENDENT_AMBULATORY_CARE_PROVIDER_SITE_OTHER): Payer: Self-pay | Admitting: Nurse Practitioner

## 2020-02-26 DIAGNOSIS — T829XXS Unspecified complication of cardiac and vascular prosthetic device, implant and graft, sequela: Secondary | ICD-10-CM

## 2020-02-27 ENCOUNTER — Other Ambulatory Visit: Payer: Self-pay

## 2020-02-27 ENCOUNTER — Ambulatory Visit (INDEPENDENT_AMBULATORY_CARE_PROVIDER_SITE_OTHER): Payer: Medicare Other | Admitting: Nurse Practitioner

## 2020-02-27 ENCOUNTER — Encounter (INDEPENDENT_AMBULATORY_CARE_PROVIDER_SITE_OTHER): Payer: Self-pay | Admitting: Nurse Practitioner

## 2020-02-27 ENCOUNTER — Ambulatory Visit (INDEPENDENT_AMBULATORY_CARE_PROVIDER_SITE_OTHER): Payer: Medicare Other

## 2020-02-27 VITALS — BP 168/93 | HR 80 | Resp 16 | Wt 174.6 lb

## 2020-02-27 DIAGNOSIS — Z227 Latent tuberculosis: Secondary | ICD-10-CM | POA: Insufficient documentation

## 2020-02-27 DIAGNOSIS — E785 Hyperlipidemia, unspecified: Secondary | ICD-10-CM | POA: Insufficient documentation

## 2020-02-27 DIAGNOSIS — N186 End stage renal disease: Secondary | ICD-10-CM | POA: Diagnosis not present

## 2020-02-27 DIAGNOSIS — M109 Gout, unspecified: Secondary | ICD-10-CM | POA: Insufficient documentation

## 2020-02-27 DIAGNOSIS — E669 Obesity, unspecified: Secondary | ICD-10-CM | POA: Insufficient documentation

## 2020-02-27 DIAGNOSIS — E039 Hypothyroidism, unspecified: Secondary | ICD-10-CM | POA: Insufficient documentation

## 2020-02-27 DIAGNOSIS — I10 Essential (primary) hypertension: Secondary | ICD-10-CM

## 2020-02-27 DIAGNOSIS — T829XXS Unspecified complication of cardiac and vascular prosthetic device, implant and graft, sequela: Secondary | ICD-10-CM

## 2020-02-27 DIAGNOSIS — N051 Unspecified nephritic syndrome with focal and segmental glomerular lesions: Secondary | ICD-10-CM | POA: Insufficient documentation

## 2020-02-27 NOTE — Progress Notes (Signed)
Subjective:    Patient ID: Felicia Butler, female    DOB: 06-27-1960, 59 y.o.   MRN: [id number] Chief Complaint  Patient presents with  . Follow-up    ref [facility] HDA consult    Patient is an established patient of ours who has not been seen in the office since approximately 2018.  However she was referred by Dr. Darden Dates in regards to a palpable knot on her left upper arm.  The patient denies any issues currently with her brachiocephalic AV fistula.  She denies any extensive episodes of bleeding.  She denies any steal syndrome.  She denies any missed dialysis sessions.  Overall she says it has been functioning well.  Today noninvasive studies show the patient has a flow volume of 4309.  The palpable pulsatile area in the upper arm appears to be part of her cephalic vein that is just very superficial.  There is no pseudoaneurysm seen.  The entire AV fistula is dilated and tortuous.  It is noted that the confluence shows a narrowing of 0.31 cm with a significant velocity increase in that area.   Review of Systems  Hematological: Bruises/bleeds easily.  All other systems reviewed and are negative.      Objective:   Physical Exam Vitals reviewed.  HENT:     Head: Normocephalic.  Cardiovascular:     Rate and Rhythm: Normal rate.     Pulses: Normal pulses.          Radial pulses are 2+ on the left side.     Arteriovenous access: left arteriovenous access is present.    Comments: Good thrill and bruit of left brachiocephalic AV fistula Pulmonary:     Effort: Pulmonary effort is normal.  Neurological:     Mental Status: She is alert and oriented to person, place, and time.  Psychiatric:        Mood and Affect: Mood normal.        Behavior: Behavior normal.        Thought Content: Thought content normal.        Judgment: Judgment normal.     BP (!) 168/93 (BP Location: Right Arm)   Pulse 80   Resp 16   Wt 174 lb 9.6 oz (79.2 kg)   LMP 11/18/1996 (Approximate)   BMI 32.99 kg/m    Past Medical History:  Diagnosis Date  . Chronic kidney disease   . Hernia   . Hyperlipidemia   . Hypertension   . Hypothyroidism   . Thyroid disease     Social History   Socioeconomic History  . Marital status: Divorced    Spouse name: Not on file  . Number of children: Not on file  . Years of education: Not on file  . Highest education level: Not on file  Occupational History  . Not on file  Tobacco Use  . Smoking status: Former Smoker    Years: 5.00    Quit date: 05/31/1983    Years since quitting: 36.7  . Smokeless tobacco: Never Used  Substance and Sexual Activity  . Alcohol use: No  . Drug use: No  . Sexual activity: Not on file  Other Topics Concern  . Not on file  Social History Narrative  . Not on file   Social Determinants of Health   Financial Resource Strain:   . Difficulty of Paying Living Expenses: Not on file  Food Insecurity:   . Worried About Charity fundraiser in the Last Year: Not  on file  . Ran Out of Food in the Last Year: Not on file  Transportation Needs:   . Lack of Transportation (Medical): Not on file  . Lack of Transportation (Non-Medical): Not on file  Physical Activity:   . Days of Exercise per Week: Not on file  . Minutes of Exercise per Session: Not on file  Stress:   . Feeling of Stress : Not on file  Social Connections:   . Frequency of Communication with Friends and Family: Not on file  . Frequency of Social Gatherings with Friends and Family: Not on file  . Attends Religious Services: Not on file  . Active Member of Clubs or Organizations: Not on file  . Attends Archivist Meetings: Not on file  . Marital Status: Not on file  Intimate Partner Violence:   . Fear of Current or Ex-Partner: Not on file  . Emotionally Abused: Not on file  . Physically Abused: Not on file  . Sexually Abused: Not on file    Past Surgical History:  Procedure Laterality Date  . AV FISTULA PLACEMENT Left 01/18/2017   Procedure:  ARTERIOVENOUS (AV) FISTULA CREATION ( BRACHIAL CEPHALIC );  Surgeon: Katha Cabal, MD;  Location: [facility];  Service: Vascular;  Laterality: Left;  . BREAST BIOPSY Right 04-30-13   intraductal papilloma  . BREAST BIOPSY Right 04-30-13   cyst aspiration  . BREAST BIOPSY Right 05-27-13   subareolar duct excision  . BREAST BIOPSY Left 05-27-13   subareolar duct excision  . BREAST BIOPSY Left 08/11/2016   path pending  . HERNIA REPAIR  1960's  . PARTIAL HYSTERECTOMY    . PERIPHERAL VASCULAR THROMBECTOMY Left 04/18/2017   Procedure: PERIPHERAL VASCULAR THROMBECTOMY;  Surgeon: Katha Cabal, MD;  Location: [place] [facility];  Service: Cardiovascular;  Laterality: Left;    Family History  Problem Relation Age of Onset  . Cancer Mother   . Diabetes Father   . Stroke Father     No Known Allergies     Assessment & Plan:   1. End stage renal disease ([place]) Recommend:  The patient is doing well and currently has adequate dialysis access. The patient's dialysis center is not reporting any access issues. Flow pattern is stable when compared to the prior ultrasound.  The patient should have a duplex ultrasound of the dialysis access in6 months. The patient will follow-up with me in the office after each ultrasound   The patient is advised that if she begins to have episodes of bleeding or issues with machine alarms that intervention may be needed at the area of the compounds.  Otherwise we will continue with follow-up as listed above. 2. Essential hypertension Continue antihypertensive medications as already ordered, these medications have been reviewed and there are no changes at this time.   3. Hyperlipidemia, unspecified hyperlipidemia type Continue statin as ordered and reviewed, no changes at this time    Current Outpatient Medications on File Prior to Visit  Medication Sig Dispense Refill  . amLODipine (NORVASC) 5 MG tablet Take 5 mg by mouth daily.    Marland Kitchen  atorvastatin (LIPITOR) 80 MG tablet Take 80 mg by mouth daily.    Lorin Picket 1 GM 210 MG(Fe) tablet Take 630 mg by mouth 3 (three) times daily.    . cinacalcet (SENSIPAR) 30 MG tablet Take 30 mg by mouth daily.    . famotidine (PEPCID) 20 MG tablet Take 20 mg by mouth 2 (two) times daily.    Marland Kitchen  levothyroxine (SYNTHROID, LEVOTHROID) 75 MCG tablet Take 75 mcg by mouth daily before breakfast.    . Methoxy PEG-Epoetin Beta (MIRCERA IJ) Mircera    . metoprolol succinate (TOPROL-XL) 25 MG 24 hr tablet Take 25 mg by mouth daily. In am.    . amLODipine-atorvastatin (CADUET) 10-80 MG per tablet Take 1 tablet by mouth daily. In evening (Patient not taking: Reported on 02/27/2020)    . cephALEXin (KEFLEX) 500 MG capsule Take 500 mg by mouth 2 (two) times daily. (Patient not taking: Reported on 02/27/2020)    . hydrALAZINE (APRESOLINE) 50 MG tablet Take 50 mg by mouth 2 (two) times daily. (Patient not taking: Reported on 02/27/2020)    . HYDROcodone-acetaminophen (NORCO) 5-325 MG tablet Take 1-2 tablets by mouth every 6 (six) hours as needed. (Patient not taking: Reported on 02/27/2020) 50 tablet 0  . isosorbide mononitrate (IMDUR) 30 MG 24 hr tablet Take 30 mg by mouth daily. In am. (Patient not taking: Reported on 02/27/2020)    . lisinopril (PRINIVIL,ZESTRIL) 10 MG tablet Take 20 mg by mouth 2 (two) times daily.  (Patient not taking: Reported on 02/27/2020)    . metoprolol tartrate (LOPRESSOR) 25 MG tablet Take by mouth. (Patient not taking: Reported on 02/27/2020)    . oxyCODONE-acetaminophen (PERCOCET/ROXICET) 5-325 MG per tablet Take 1 tablet by mouth every 4 (four) hours as needed for severe pain. (Patient not taking: Reported on 02/27/2020)     No current facility-administered medications on file prior to visit.    There are no Patient Instructions on file for this visit. No follow-ups on file.   Kris Hartmann, NP

## 2020-03-04 ENCOUNTER — Other Ambulatory Visit: Payer: Self-pay | Admitting: Internal Medicine

## 2020-03-04 DIAGNOSIS — Z1231 Encounter for screening mammogram for malignant neoplasm of breast: Secondary | ICD-10-CM

## 2020-08-13 ENCOUNTER — Other Ambulatory Visit: Payer: Self-pay | Admitting: Internal Medicine

## 2020-08-13 DIAGNOSIS — R928 Other abnormal and inconclusive findings on diagnostic imaging of breast: Secondary | ICD-10-CM

## 2020-08-19 DIAGNOSIS — T829XXA Unspecified complication of cardiac and vascular prosthetic device, implant and graft, initial encounter: Secondary | ICD-10-CM | POA: Insufficient documentation

## 2020-08-19 NOTE — Progress Notes (Deleted)
MRN : [id number]  Felicia Butler is a 60 y.o. (19-Apr-1961) female who presents with chief complaint of No chief complaint on file. Marland Kitchen  History of Present Illness:     No outpatient medications have been marked as taking for the 08/20/20 encounter (Appointment) with Delana Meyer, Dolores Lory, MD.    Past Medical History:  Diagnosis Date  . Chronic kidney disease   . Hernia   . Hyperlipidemia   . Hypertension   . Hypothyroidism   . Thyroid disease     Past Surgical History:  Procedure Laterality Date  . AV FISTULA PLACEMENT Left 01/18/2017   Procedure: ARTERIOVENOUS (AV) FISTULA CREATION ( BRACHIAL CEPHALIC );  Surgeon: Katha Cabal, MD;  Location: [facility];  Service: Vascular;  Laterality: Left;  . BREAST BIOPSY Right 04-30-13   intraductal papilloma  . BREAST BIOPSY Right 04-30-13   cyst aspiration  . BREAST BIOPSY Right 05-27-13   subareolar duct excision  . BREAST BIOPSY Left 05-27-13   subareolar duct excision  . BREAST BIOPSY Left 08/11/2016   path pending  . HERNIA REPAIR  1960's  . PARTIAL HYSTERECTOMY    . PERIPHERAL VASCULAR THROMBECTOMY Left 04/18/2017   Procedure: PERIPHERAL VASCULAR THROMBECTOMY;  Surgeon: Katha Cabal, MD;  Location: [place] [facility];  Service: Cardiovascular;  Laterality: Left;    Social History Social History   Tobacco Use  . Smoking status: Former Smoker    Years: 5.00    Quit date: 05/31/1983    Years since quitting: 37.2  . Smokeless tobacco: Never Used  Substance Use Topics  . Alcohol use: No  . Drug use: No    Family History Family History  Problem Relation Age of Onset  . Cancer Mother   . Diabetes Father   . Stroke Father     No Known Allergies   REVIEW OF SYSTEMS (Negative unless checked)  Constitutional: [] Weight loss  [] Fever  [] Chills Cardiac: [] Chest pain   [] Chest pressure   [] Palpitations   [] Shortness of breath when laying flat   [] Shortness of breath with exertion. Vascular:  [] Pain in legs  with walking   [] Pain in legs at rest  [] History of DVT   [] Phlebitis   [] Swelling in legs   [] Varicose veins   [] Non-healing ulcers Pulmonary:   [] Uses home oxygen   [] Productive cough   [] Hemoptysis   [] Wheeze  [] COPD   [] Asthma Neurologic:  [] Dizziness   [] Seizures   [] History of stroke   [] History of TIA  [] Aphasia   [] Vissual changes   [] Weakness or numbness in arm   [] Weakness or numbness in leg Musculoskeletal:   [] Joint swelling   [] Joint pain   [] Low back pain Hematologic:  [] Easy bruising  [] Easy bleeding   [] Hypercoagulable state   [] Anemic Gastrointestinal:  [] Diarrhea   [] Vomiting  [] Gastroesophageal reflux/heartburn   [] Difficulty swallowing. Genitourinary:  [x] Chronic kidney disease   [] Difficult urination  [] Frequent urination   [] Blood in urine Skin:  [] Rashes   [] Ulcers  Psychological:  [] History of anxiety   []  History of major depression.  Physical Examination  There were no vitals filed for this visit. There is no height or weight on file to calculate BMI. Gen: WD/WN, NAD Head: [place]/AT, No temporalis wasting.  Ear/Nose/Throat: Hearing grossly intact, nares w/o erythema or drainage Eyes: PER, EOMI, sclera nonicteric.  Neck: Supple, no large masses.   Pulmonary:  Good air movement, no audible wheezing bilaterally, no use of accessory muscles.  Cardiac: RRR, no JVD  Vascular:  Vessel Right Left  Radial Palpable Palpable  Brachial Palpable Palpable  Carotid Palpable Palpable  Gastrointestinal: Non-distended. No guarding/no peritoneal signs.  Musculoskeletal: M/S 5/5 throughout.  No deformity or atrophy.  Neurologic: CN 2-12 intact. Symmetrical.  Speech is fluent. Motor exam as listed above. Psychiatric: Judgment intact, Mood & affect appropriate for pt's clinical situation. Dermatologic: No rashes or ulcers noted.  No changes consistent with cellulitis. Lymph : No lichenification or skin changes of chronic lymphedema.  CBC Lab Results  Component Value Date   WBC 10.0  06/21/2013   HGB 11.7 (L) 06/21/2013   HCT 36.3 06/21/2013   MCV 83 06/21/2013   PLT 338 06/21/2013    BMET    Component Value Date/Time   NA 140 01/10/2017 0828   NA 138 06/21/2013 0048   K 4.4 01/10/2017 0828   K 4.1 06/21/2013 0048   CL 113 (H) 01/10/2017 0828   CL 108 (H) 06/21/2013 0048   CO2 16 (L) 01/10/2017 0828   CO2 22 06/21/2013 0048   GLUCOSE 95 01/10/2017 0828   GLUCOSE 122 (H) 06/21/2013 0048   BUN 81 (H) 01/10/2017 0828   BUN 44 (H) 06/21/2013 0048   CREATININE 8.44 (H) 01/10/2017 0828   CREATININE 3.33 (H) 06/21/2013 0048   CALCIUM 10.0 01/10/2017 0828   CALCIUM 9.6 06/21/2013 0048   GFRNONAA 5 (L) 01/10/2017 0828   GFRNONAA 15 (L) 06/21/2013 0048   GFRAA 5 (L) 01/10/2017 0828   GFRAA 17 (L) 06/21/2013 0048   CrCl cannot be calculated (Patient's most recent lab result is older than the maximum 21 days allowed.).  COAG Lab Results  Component Value Date   INR 0.95 01/10/2017    Radiology No results found.  Assessment/Plan There are no diagnoses linked to this encounter.   [name], MD  08/19/2020 1:53 PM

## 2020-08-20 ENCOUNTER — Ambulatory Visit (INDEPENDENT_AMBULATORY_CARE_PROVIDER_SITE_OTHER): Payer: Medicare Other | Admitting: Vascular Surgery

## 2020-08-20 ENCOUNTER — Other Ambulatory Visit (INDEPENDENT_AMBULATORY_CARE_PROVIDER_SITE_OTHER): Payer: Self-pay | Admitting: Vascular Surgery

## 2020-08-20 ENCOUNTER — Encounter (INDEPENDENT_AMBULATORY_CARE_PROVIDER_SITE_OTHER): Payer: Medicare Other

## 2020-08-20 DIAGNOSIS — I10 Essential (primary) hypertension: Secondary | ICD-10-CM

## 2020-08-20 DIAGNOSIS — T829XXS Unspecified complication of cardiac and vascular prosthetic device, implant and graft, sequela: Secondary | ICD-10-CM

## 2020-08-20 DIAGNOSIS — N183 Chronic kidney disease, stage 3 unspecified: Secondary | ICD-10-CM

## 2020-08-20 DIAGNOSIS — E785 Hyperlipidemia, unspecified: Secondary | ICD-10-CM

## 2020-08-20 DIAGNOSIS — N186 End stage renal disease: Secondary | ICD-10-CM

## 2020-09-10 ENCOUNTER — Encounter (INDEPENDENT_AMBULATORY_CARE_PROVIDER_SITE_OTHER): Payer: Medicare Other

## 2020-09-10 ENCOUNTER — Ambulatory Visit (INDEPENDENT_AMBULATORY_CARE_PROVIDER_SITE_OTHER): Payer: Medicare Other | Admitting: Vascular Surgery

## 2020-09-10 DIAGNOSIS — I2510 Atherosclerotic heart disease of native coronary artery without angina pectoris: Secondary | ICD-10-CM | POA: Insufficient documentation

## 2020-09-10 NOTE — Progress Notes (Deleted)
MRN : [id number]  Felicia Butler is a 60 y.o. (May 28, 1961) female who presents with chief complaint of No chief complaint on file. Marland Kitchen  History of Present Illness:   The patient denies any issues currently with her brachiocephalic AV fistula.  She denies any episodes of prolonged bleeding.  She denies any steal syndrome or hand pain.  She denies any missed dialysis sessions.  Overall she says it has been functioning well.  Today noninvasive studies show the patient has a flow volume of 4309.  The palpable pulsatile area in the upper arm appears to be part of her cephalic vein that is just very superficial.  There is no pseudoaneurysm seen.  The entire AV fistula is dilated and tortuous.  It is noted that the confluence shows a narrowing of 0.31 cm with a significant velocity increase in that area.  No outpatient medications have been marked as taking for the 09/10/20 encounter (Appointment) with Delana Meyer, Dolores Lory, MD.    Past Medical History:  Diagnosis Date  . Chronic kidney disease   . Hernia   . Hyperlipidemia   . Hypertension   . Hypothyroidism   . Thyroid disease     Past Surgical History:  Procedure Laterality Date  . AV FISTULA PLACEMENT Left 01/18/2017   Procedure: ARTERIOVENOUS (AV) FISTULA CREATION ( BRACHIAL CEPHALIC );  Surgeon: Katha Cabal, MD;  Location: [facility];  Service: Vascular;  Laterality: Left;  . BREAST BIOPSY Right 04-30-13   intraductal papilloma  . BREAST BIOPSY Right 04-30-13   cyst aspiration  . BREAST BIOPSY Right 05-27-13   subareolar duct excision  . BREAST BIOPSY Left 05-27-13   subareolar duct excision  . BREAST BIOPSY Left 08/11/2016   path pending  . HERNIA REPAIR  1960's  . PARTIAL HYSTERECTOMY    . PERIPHERAL VASCULAR THROMBECTOMY Left 04/18/2017   Procedure: PERIPHERAL VASCULAR THROMBECTOMY;  Surgeon: Katha Cabal, MD;  Location: [place] [facility];  Service: Cardiovascular;  Laterality: Left;    Social History Social  History   Tobacco Use  . Smoking status: Former Smoker    Years: 5.00    Quit date: 05/31/1983    Years since quitting: 37.3  . Smokeless tobacco: Never Used  Substance Use Topics  . Alcohol use: No  . Drug use: No    Family History Family History  Problem Relation Age of Onset  . Cancer Mother   . Diabetes Father   . Stroke Father     No Known Allergies   REVIEW OF SYSTEMS (Negative unless checked)  Constitutional: [] Weight loss  [] Fever  [] Chills Cardiac: [] Chest pain   [] Chest pressure   [] Palpitations   [] Shortness of breath when laying flat   [] Shortness of breath with exertion. Vascular:  [] Pain in legs with walking   [] Pain in legs at rest  [] History of DVT   [] Phlebitis   [] Swelling in legs   [] Varicose veins   [] Non-healing ulcers Pulmonary:   [] Uses home oxygen   [] Productive cough   [] Hemoptysis   [] Wheeze  [] COPD   [] Asthma Neurologic:  [] Dizziness   [] Seizures   [] History of stroke   [] History of TIA  [] Aphasia   [] Vissual changes   [] Weakness or numbness in arm   [] Weakness or numbness in leg Musculoskeletal:   [] Joint swelling   [] Joint pain   [] Low back pain Hematologic:  [] Easy bruising  [] Easy bleeding   [] Hypercoagulable state   [] Anemic Gastrointestinal:  [] Diarrhea   [] Vomiting  [] Gastroesophageal  reflux/heartburn   [] Difficulty swallowing. Genitourinary:  [x] Chronic kidney disease   [] Difficult urination  [] Frequent urination   [] Blood in urine Skin:  [] Rashes   [] Ulcers  Psychological:  [] History of anxiety   []  History of major depression.  Physical Examination  There were no vitals filed for this visit. There is no height or weight on file to calculate BMI. Gen: WD/WN, NAD Head: [place]/AT, No temporalis wasting.  Ear/Nose/Throat: Hearing grossly intact, nares w/o erythema or drainage Eyes: PER, EOMI, sclera nonicteric.  Neck: Supple, no large masses.   Pulmonary:  Good air movement, no audible wheezing bilaterally, no use of accessory muscles.   Cardiac: RRR, no JVD Vascular: left brachial cephalic fistula good thrill good bruit Vessel Right Left  Radial Palpable Palpable  Ulnar Palpable Not Palpable  Brachial Palpable Palpable  Gastrointestinal: Non-distended. No guarding/no peritoneal signs.  Musculoskeletal: M/S 5/5 throughout.  No deformity or atrophy.  Neurologic: CN 2-12 intact. Symmetrical.  Speech is fluent. Motor exam as listed above. Psychiatric: Judgment intact, Mood & affect appropriate for pt's clinical situation. Dermatologic: No rashes or ulcers noted.  No changes consistent with cellulitis.   CBC Lab Results  Component Value Date   WBC 10.0 06/21/2013   HGB 11.7 (L) 06/21/2013   HCT 36.3 06/21/2013   MCV 83 06/21/2013   PLT 338 06/21/2013    BMET    Component Value Date/Time   NA 140 01/10/2017 0828   NA 138 06/21/2013 0048   K 4.4 01/10/2017 0828   K 4.1 06/21/2013 0048   CL 113 (H) 01/10/2017 0828   CL 108 (H) 06/21/2013 0048   CO2 16 (L) 01/10/2017 0828   CO2 22 06/21/2013 0048   GLUCOSE 95 01/10/2017 0828   GLUCOSE 122 (H) 06/21/2013 0048   BUN 81 (H) 01/10/2017 0828   BUN 44 (H) 06/21/2013 0048   CREATININE 8.44 (H) 01/10/2017 0828   CREATININE 3.33 (H) 06/21/2013 0048   CALCIUM 10.0 01/10/2017 0828   CALCIUM 9.6 06/21/2013 0048   GFRNONAA 5 (L) 01/10/2017 0828   GFRNONAA 15 (L) 06/21/2013 0048   GFRAA 5 (L) 01/10/2017 0828   GFRAA 17 (L) 06/21/2013 0048   CrCl cannot be calculated (Patient's most recent lab result is older than the maximum 21 days allowed.).  COAG Lab Results  Component Value Date   INR 0.95 01/10/2017    Radiology No results found.   Assessment/Plan   [name], MD  09/10/2020 10:23 AM

## 2020-09-23 ENCOUNTER — Ambulatory Visit
Admission: RE | Admit: 2020-09-23 | Discharge: 2020-09-23 | Disposition: A | Discharge status: Home / Self Care | Payer: Medicare Other | Source: Ambulatory Visit | Attending: Internal Medicine | Admitting: Internal Medicine

## 2020-09-23 ENCOUNTER — Other Ambulatory Visit: Payer: Self-pay

## 2020-09-23 DIAGNOSIS — R928 Other abnormal and inconclusive findings on diagnostic imaging of breast: Secondary | ICD-10-CM | POA: Diagnosis present

## 2020-11-06 DIAGNOSIS — Z1152 Encounter for screening for COVID-19: Secondary | ICD-10-CM | POA: Insufficient documentation

## 2020-11-06 DIAGNOSIS — Z20822 Contact with and (suspected) exposure to covid-19: Secondary | ICD-10-CM | POA: Insufficient documentation

## 2021-01-06 DIAGNOSIS — E782 Mixed hyperlipidemia: Secondary | ICD-10-CM | POA: Insufficient documentation

## 2021-01-14 ENCOUNTER — Encounter (INDEPENDENT_AMBULATORY_CARE_PROVIDER_SITE_OTHER): Payer: Self-pay | Admitting: Vascular Surgery

## 2021-01-14 ENCOUNTER — Ambulatory Visit (INDEPENDENT_AMBULATORY_CARE_PROVIDER_SITE_OTHER): Payer: Medicare Other | Admitting: Vascular Surgery

## 2021-01-14 ENCOUNTER — Other Ambulatory Visit: Payer: Self-pay

## 2021-01-14 VITALS — BP 170/78 | HR 92 | Resp 16 | Wt 157.6 lb

## 2021-01-14 DIAGNOSIS — I2510 Atherosclerotic heart disease of native coronary artery without angina pectoris: Secondary | ICD-10-CM

## 2021-01-14 DIAGNOSIS — I10 Essential (primary) hypertension: Secondary | ICD-10-CM | POA: Diagnosis not present

## 2021-01-14 DIAGNOSIS — N186 End stage renal disease: Secondary | ICD-10-CM | POA: Diagnosis not present

## 2021-01-14 DIAGNOSIS — E1122 Type 2 diabetes mellitus with diabetic chronic kidney disease: Secondary | ICD-10-CM

## 2021-01-14 DIAGNOSIS — N183 Chronic kidney disease, stage 3 unspecified: Secondary | ICD-10-CM

## 2021-01-17 ENCOUNTER — Encounter (INDEPENDENT_AMBULATORY_CARE_PROVIDER_SITE_OTHER): Payer: Self-pay | Admitting: Vascular Surgery

## 2021-01-17 NOTE — Progress Notes (Signed)
MRN : [id number]  Felicia Butler is a 60 y.o. (June 10, 1960) female who presents with chief complaint of check access.  History of Present Illness: The patient returns to the office for followup of their dialysis access. The function of the access has been stable. The patient denies increased bleeding time or increased recirculation. Patient denies difficulty with cannulation. The patient denies hand pain or other symptoms consistent with steal phenomena.  No significant arm swelling.  The patient denies redness or swelling at the access site. The patient denies fever or chills at home or while on dialysis.  The patient denies amaurosis fugax or recent TIA symptoms. There are no recent neurological changes noted. The patient denies claudication symptoms or rest pain symptoms. The patient denies history of DVT, PE or superficial thrombophlebitis. The patient denies recent episodes of angina or shortness of breath.      Current Meds  Medication Sig   amLODipine (NORVASC) 5 MG tablet Take 5 mg by mouth daily.   atorvastatin (LIPITOR) 80 MG tablet Take 80 mg by mouth daily.   AURYXIA 1 GM 210 MG(Fe) tablet Take 630 mg by mouth 3 (three) times daily.   cinacalcet (SENSIPAR) 30 MG tablet Take 30 mg by mouth daily.   famotidine (PEPCID) 20 MG tablet Take 20 mg by mouth 2 (two) times daily.   levothyroxine (SYNTHROID, LEVOTHROID) 75 MCG tablet Take 75 mcg by mouth daily before breakfast.   Methoxy PEG-Epoetin Beta (MIRCERA IJ) Mircera    Past Medical History:  Diagnosis Date   Chronic kidney disease    Hernia    Hyperlipidemia    Hypertension    Hypothyroidism    Thyroid disease     Past Surgical History:  Procedure Laterality Date   AV FISTULA PLACEMENT Left 01/18/2017   Procedure: ARTERIOVENOUS (AV) FISTULA CREATION ( BRACHIAL CEPHALIC );  Surgeon: Katha Cabal, MD;  Location: [facility];  Service: Vascular;  Laterality: Left;   BREAST BIOPSY Right 04-30-13   intraductal  papilloma   BREAST BIOPSY Right 04-30-13   cyst aspiration   BREAST BIOPSY Right 05-27-13   subareolar duct excision   BREAST BIOPSY Left 05-27-13   subareolar duct excision   BREAST BIOPSY Left 08/11/2016   path pending   HERNIA REPAIR  1960's   PARTIAL HYSTERECTOMY     PERIPHERAL VASCULAR THROMBECTOMY Left 04/18/2017   Procedure: PERIPHERAL VASCULAR THROMBECTOMY;  Surgeon: Katha Cabal, MD;  Location: [facility];  Service: Cardiovascular;  Laterality: Left;    Social History Social History   Tobacco Use   Smoking status: Former    Years: 5.00    Types: Cigarettes    Quit date: 05/31/1983    Years since quitting: 37.6   Smokeless tobacco: Never  Substance Use Topics   Alcohol use: No   Drug use: No    Family History Family History  Problem Relation Age of Onset   Cancer Mother    Diabetes Father    Stroke Father     No Known Allergies   REVIEW OF SYSTEMS (Negative unless checked)  Constitutional: [] Weight loss  [] Fever  [] Chills Cardiac: [] Chest pain   [] Chest pressure   [] Palpitations   [] Shortness of breath when laying flat   [] Shortness of breath with exertion. Vascular:  [] Pain in legs with walking   [] Pain in legs at rest  [] History of DVT   [] Phlebitis   [] Swelling in legs   [] Varicose veins   [] Non-healing ulcers Pulmonary:   []   Uses home oxygen   [] Productive cough   [] Hemoptysis   [] Wheeze  [] COPD   [] Asthma Neurologic:  [] Dizziness   [] Seizures   [] History of stroke   [] History of TIA  [] Aphasia   [] Vissual changes   [] Weakness or numbness in arm   [] Weakness or numbness in leg Musculoskeletal:   [] Joint swelling   [] Joint pain   [] Low back pain Hematologic:  [] Easy bruising  [] Easy bleeding   [] Hypercoagulable state   [] Anemic Gastrointestinal:  [] Diarrhea   [] Vomiting  [] Gastroesophageal reflux/heartburn   [] Difficulty swallowing. Genitourinary:  [x] Chronic kidney disease   [] Difficult urination  [] Frequent urination   [] Blood in  urine Skin:  [] Rashes   [] Ulcers  Psychological:  [] History of anxiety   []  History of major depression.  Physical Examination  Vitals:   01/14/21 1531  BP: (!) 170/78  Pulse: 92  Resp: 16  Weight: 157 lb 9.6 oz (71.5 kg)   Body mass index is 29.78 kg/m. Gen: WD/WN, NAD Head: [place]/AT, No temporalis wasting.  Ear/Nose/Throat: Hearing grossly intact, nares w/o erythema or drainage Eyes: PER, EOMI, sclera nonicteric.  Neck: Supple, no gross masses or lesions.  No JVD.  Pulmonary:  Good air movement, no audible wheezing, no use of accessory muscles.  Cardiac: RRR, precordium non-hyperdynamic. Vascular:    left brachial cephalic fistula moderate aneurysm; good thrill good bruit Vessel Right Left  Radial Palpable Palpable  Brachial Palpable Palpable  Gastrointestinal: soft, non-distended. No guarding/no peritoneal signs.  Musculoskeletal: M/S 5/5 throughout.  No deformity.  Neurologic: CN 2-12 intact. Pain and light touch intact in extremities.  Symmetrical.  Speech is fluent. Motor exam as listed above. Psychiatric: Judgment intact, Mood & affect appropriate for pt's clinical situation. Dermatologic: No rashes or ulcers noted.  No changes consistent with cellulitis.   CBC Lab Results  Component Value Date   WBC 10.0 06/21/2013   HGB 11.7 (L) 06/21/2013   HCT 36.3 06/21/2013   MCV 83 06/21/2013   PLT 338 06/21/2013    BMET    Component Value Date/Time   NA 140 01/10/2017 0828   NA 138 06/21/2013 0048   K 4.4 01/10/2017 0828   K 4.1 06/21/2013 0048   CL 113 (H) 01/10/2017 0828   CL 108 (H) 06/21/2013 0048   CO2 16 (L) 01/10/2017 0828   CO2 22 06/21/2013 0048   GLUCOSE 95 01/10/2017 0828   GLUCOSE 122 (H) 06/21/2013 0048   BUN 81 (H) 01/10/2017 0828   BUN 44 (H) 06/21/2013 0048   CREATININE 8.44 (H) 01/10/2017 0828   CREATININE 3.33 (H) 06/21/2013 0048   CALCIUM 10.0 01/10/2017 0828   CALCIUM 9.6 06/21/2013 0048   GFRNONAA 5 (L) 01/10/2017 0828   GFRNONAA 15 (L)  06/21/2013 0048   GFRAA 5 (L) 01/10/2017 0828   GFRAA 17 (L) 06/21/2013 0048   CrCl cannot be calculated (Patient's most recent lab result is older than the maximum 21 days allowed.).  COAG Lab Results  Component Value Date   INR 0.95 01/10/2017    Radiology No results found.   Assessment/Plan 1. End stage renal disease ([place]) Recommend:  The patient is doing well and currently has adequate dialysis access. The patient's dialysis center is not reporting any access issues. Flow pattern is stable when compared to the prior ultrasound.  The patient should have a duplex ultrasound of the dialysis access in 6 months. The patient will follow-up with me in the office after each ultrasound    - VAS US DUPLEX  DIALYSIS ACCESS (AVF, AVG); Future  2. CAD in native artery Continue cardiac and antihypertensive medications as already ordered and reviewed, no changes at this time.  Continue statin as ordered and reviewed, no changes at this time  Nitrates PRN for chest pain   3. Essential hypertension Continue antihypertensive medications as already ordered, these medications have been reviewed and there are no changes at this time.   4. Type 2 diabetes mellitus with stage 3 chronic kidney disease, unspecified whether long term insulin use, unspecified whether stage 3a or 3b CKD ([place]) Continue hypoglycemic medications as already ordered, these medications have been reviewed and there are no changes at this time.  Hgb A1C to be monitored as already arranged by primary service     [name], MD  01/17/2021 11:40 AM

## 2021-05-24 ENCOUNTER — Other Ambulatory Visit: Payer: Self-pay

## 2021-05-24 ENCOUNTER — Encounter: Payer: Self-pay | Admitting: Emergency Medicine

## 2021-05-24 ENCOUNTER — Emergency Department
Admission: EM | Admit: 2021-05-24 | Discharge: 2021-05-24 | Disposition: A | Discharge status: Home / Self Care | Payer: Medicare Other | Attending: Emergency Medicine | Admitting: Emergency Medicine

## 2021-05-24 DIAGNOSIS — Z79899 Other long term (current) drug therapy: Secondary | ICD-10-CM | POA: Insufficient documentation

## 2021-05-24 DIAGNOSIS — N189 Chronic kidney disease, unspecified: Secondary | ICD-10-CM | POA: Diagnosis not present

## 2021-05-24 DIAGNOSIS — I129 Hypertensive chronic kidney disease with stage 1 through stage 4 chronic kidney disease, or unspecified chronic kidney disease: Secondary | ICD-10-CM | POA: Diagnosis not present

## 2021-05-24 DIAGNOSIS — Y732 Prosthetic and other implants, materials and accessory gastroenterology and urology devices associated with adverse incidents: Secondary | ICD-10-CM | POA: Insufficient documentation

## 2021-05-24 DIAGNOSIS — I2510 Atherosclerotic heart disease of native coronary artery without angina pectoris: Secondary | ICD-10-CM | POA: Insufficient documentation

## 2021-05-24 DIAGNOSIS — Z8616 Personal history of COVID-19: Secondary | ICD-10-CM | POA: Insufficient documentation

## 2021-05-24 DIAGNOSIS — E039 Hypothyroidism, unspecified: Secondary | ICD-10-CM | POA: Insufficient documentation

## 2021-05-24 DIAGNOSIS — Z87891 Personal history of nicotine dependence: Secondary | ICD-10-CM | POA: Insufficient documentation

## 2021-05-24 DIAGNOSIS — T82838A Hemorrhage of vascular prosthetic devices, implants and grafts, initial encounter: Secondary | ICD-10-CM | POA: Diagnosis present

## 2021-05-24 DIAGNOSIS — E1122 Type 2 diabetes mellitus with diabetic chronic kidney disease: Secondary | ICD-10-CM | POA: Insufficient documentation

## 2021-05-24 NOTE — ED Notes (Signed)
Patient discharged to home per MD order. Patient in stable condition, and deemed medically cleared by ED provider for discharge. Discharge instructions reviewed with patient/family using "Teach Back"; verbalized understanding of medication education and administration, and information about follow-up care. Denies further concerns. ° °

## 2021-05-24 NOTE — ED Triage Notes (Signed)
Pt in via EMS from [facility] dialysis due to shunt bleeding. EMS reports pt completed her treatment but then they could not get it to stop bleeding. 172/80, HR 80, 100% RA.

## 2021-05-24 NOTE — ED Provider Notes (Signed)
[facility] Emergency Department Provider Note ____________________________________________   Event Date/Time   First MD Initiated Contact with Patient 05/24/21 1054     (approximate)  I have reviewed the triage vital signs and the nursing notes.   HISTORY  Chief Complaint Vascular Access Problem    HPI Felicia Butler is a 60 y.o. female with PMH as noted below including ESRD on dialysis who presents with bleeding to her left upper arm dialysis shunt site, acute onset this morning about an hour ago when the patient had completed her dialysis.  A pressure dressing and clamp was applied by EMS.  The patient denies any other complaints.  She has no other abnormal bleeding or bruising.  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   Gout 02/27/2020   Hyperlipidemia 02/27/2020   Hypothyroidism 02/27/2020   Obesity 02/27/2020   TB lung, 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   Other disorders of phosphorus metabolism 11/07/2018   COVID-19 10/15/2018   Hyperkalemia 08/08/2018   Anemia in chronic kidney disease 04/10/2017   Coagulation defect, unspecified ([place]) 04/10/2017   Diarrhea, unspecified 04/10/2017   Encounter for immunization 04/10/2017   End stage renal disease ([place]) 04/10/2017   Fever, unspecified 04/10/2017   Headache, unspecified 04/10/2017   Iron deficiency anemia, unspecified 04/10/2017   Moderate protein-calorie malnutrition ([place]) 04/10/2017   Pain, unspecified 04/10/2017   Pruritus, unspecified 04/10/2017   Secondary hyperparathyroidism of renal origin ([place]) 04/10/2017   Shortness of breath 04/10/2017   Type 2 diabetes mellitus with diabetic chronic  kidney disease ([place]) 04/10/2017   Chronic renal insufficiency 12/18/2016   Essential hypertension 12/18/2016   Intraductal papilloma of breast 05/16/2013   Proteinuria 10/07/2012    Past Surgical History:  Procedure Laterality Date   AV FISTULA PLACEMENT Left 01/18/2017   Procedure: ARTERIOVENOUS (AV) FISTULA CREATION ( BRACHIAL CEPHALIC );  Surgeon: Katha Cabal, MD;  Location: [facility];  Service: Vascular;  Laterality: Left;   BREAST BIOPSY Right 04-30-13   intraductal papilloma   BREAST BIOPSY Right 04-30-13   cyst aspiration   BREAST BIOPSY Right 05-27-13   subareolar duct excision   BREAST BIOPSY Left 05-27-13   subareolar duct excision   BREAST BIOPSY Left 08/11/2016   path pending   HERNIA REPAIR  1960's   PARTIAL HYSTERECTOMY     PERIPHERAL VASCULAR THROMBECTOMY Left 04/18/2017   Procedure: PERIPHERAL VASCULAR THROMBECTOMY;  Surgeon: Katha Cabal, MD;  Location: [facility];  Service: Cardiovascular;  Laterality: Left;    Prior to Admission medications   Medication Sig Start Date End Date Taking? Authorizing Provider  amLODipine (NORVASC) 5 MG tablet Take 5 mg by mouth daily. 01/29/20   [provider]  amLODipine-atorvastatin (CADUET) 10-80 MG per tablet Take 1 tablet by mouth daily. In evening Patient not taking: No sig reported    [provider]  atorvastatin (LIPITOR) 80 MG tablet Take 80 mg by mouth daily. 01/29/20   [provider]  AURYXIA 1 GM 210 MG(Fe) tablet Take 630 mg by mouth 3 (three) times daily. 02/10/20   [provider]  cephALEXin (KEFLEX) 500 MG capsule Take 500 mg  by mouth 2 (two) times daily. Patient not taking: No sig reported    [provider]  cinacalcet (SENSIPAR) 30 MG tablet Take 30 mg by mouth daily. 02/10/20   [provider]  famotidine (PEPCID) 20 MG tablet Take 20 mg by mouth 2 (two) times daily. 01/30/20   [provider]  hydrALAZINE (APRESOLINE) 50 MG tablet  Take 50 mg by mouth 2 (two) times daily. Patient not taking: No sig reported    [provider]  HYDROcodone-acetaminophen (NORCO) 5-325 MG tablet Take 1-2 tablets by mouth every 6 (six) hours as needed. Patient not taking: No sig reported 01/18/17   Schnier, Dolores Lory, MD  isosorbide mononitrate (IMDUR) 30 MG 24 hr tablet Take 30 mg by mouth daily. In am. Patient not taking: No sig reported    [provider]  levothyroxine (SYNTHROID, LEVOTHROID) 75 MCG tablet Take 75 mcg by mouth daily before breakfast.    [provider]  lisinopril (PRINIVIL,ZESTRIL) 10 MG tablet Take 20 mg by mouth 2 (two) times daily.  Patient not taking: No sig reported    [provider]  losartan (COZAAR) 25 MG tablet Take 25 mg by mouth daily. 11/25/20   [provider]  metoprolol succinate (TOPROL-XL) 25 MG 24 hr tablet Take 25 mg by mouth daily. In am. Patient not taking: Reported on 01/14/2021    [provider]  metoprolol tartrate (LOPRESSOR) 25 MG tablet Take by mouth. Patient not taking: No sig reported 03/14/18   [provider]  oxyCODONE-acetaminophen (PERCOCET/ROXICET) 5-325 MG per tablet Take 1 tablet by mouth every 4 (four) hours as needed for severe pain. Patient not taking: No sig reported    [provider]    Allergies Patient has no known allergies.  Family History  Problem Relation Age of Onset   Cancer Mother    Diabetes Father    Stroke Father     Social History Social History   Tobacco Use   Smoking status: Former    Years: 5.00    Types: Cigarettes    Quit date: 05/31/1983    Years since quitting: 38.0   Smokeless tobacco: Never  Substance Use Topics   Alcohol use: No   Drug use: No    Review of Systems  Constitutional: No fever/chills Eyes: No visual changes. ENT: No sore throat. Cardiovascular: Denies chest pain. Respiratory: Denies shortness of breath. Gastrointestinal: No vomiting or diarrhea.   Genitourinary: Negative for dysuria.  Musculoskeletal: Negative for back pain. Skin: Negative for rash. Neurological: Negative for headache.   ____________________________________________   PHYSICAL EXAM:  VITAL SIGNS: ED Triage Vitals  Enc Vitals Group     BP 05/24/21 1057 (!) 175/70     Pulse Rate 05/24/21 1057 80     Resp 05/24/21 1057 20     Temp 05/24/21 1057 98.2 F (36.8 C)     Temp Source 05/24/21 1057 Oral     SpO2 05/24/21 1057 100 %     Weight 05/24/21 1056 151 lb (68.5 kg)     Height 05/24/21 1056 5\' 1"  (1.549 m)     Head Circumference --      Peak Flow --      Pain Score 05/24/21 1056 0     Pain Loc --      Pain Edu? --      Excl. in [place]? --     Constitutional: Alert and oriented. Well appearing and in no acute distress. Eyes: Conjunctivae are normal.  Head: Atraumatic. Nose: No congestion/rhinnorhea. Mouth/Throat: Mucous membranes are moist.   Neck: Normal range of motion.  Cardiovascular: Normal rate, regular rhythm.  Good peripheral circulation. Respiratory: Normal respiratory effort.  No retractions. Gastrointestinal: No distention.  Musculoskeletal: Extremities warm and well perfused.  Left upper arm shunt site with no active bleeding. Neurologic:  Normal speech and language. No gross focal neurologic deficits are appreciated.  Skin:  Skin is warm and dry. No rash noted. Psychiatric: Mood and affect are normal. Speech and behavior are normal.  ____________________________________________   LABS (all labs ordered are listed, but only abnormal results are displayed)  Labs Reviewed - No data to display ____________________________________________  EKG   ____________________________________________  RADIOLOGY    ____________________________________________   PROCEDURES  Procedure(s) performed: No  Procedures  Critical Care performed: No ____________________________________________   INITIAL IMPRESSION / ASSESSMENT AND PLAN / ED  COURSE  Pertinent labs & imaging results that were available during my care of the patient were reviewed by me and considered in my medical decision making (see chart for details).   60 year old female with history of ESRD on dialysis presents after her dialysis went to her left upper arm shunt site would not stop bleeding.  The patient denies any other acute complaints.  A clamp and pressure dressing were applied by EMS.  When I removed these, the bleeding had stopped.  There is no active bleeding at this time.  The vital signs are normal (except for hypertension) and the patient has no lightheadedness, weakness, or other symptoms of significant acute blood loss.  We will monitor her in the ED for the next hour and if there is no recurrent bleeding she will be appropriate for discharge home.  There is no indication for lab work-up.  ----------------------------------------- 12:10 PM on 05/24/2021 -----------------------------------------  The patient has been observed for over an hour and there is no recurrent bleeding.  She is stable for discharge home.  I gave her thorough return precautions and she expressed understanding.  ____________________________________________   FINAL CLINICAL IMPRESSION(S) / ED DIAGNOSES  Final diagnoses:  Bleeding from dialysis shunt, initial encounter [facility])      NEW MEDICATIONS STARTED DURING THIS VISIT:  New Prescriptions   No medications on file     Note:  This document was prepared using Dragon voice recognition software and may include unintentional dictation errors.    Arta Silence, MD 05/24/21 1210

## 2021-07-15 ENCOUNTER — Ambulatory Visit (INDEPENDENT_AMBULATORY_CARE_PROVIDER_SITE_OTHER): Payer: Medicare Other | Admitting: Vascular Surgery

## 2021-07-15 ENCOUNTER — Ambulatory Visit (INDEPENDENT_AMBULATORY_CARE_PROVIDER_SITE_OTHER): Payer: Medicare Other

## 2021-07-15 ENCOUNTER — Encounter (INDEPENDENT_AMBULATORY_CARE_PROVIDER_SITE_OTHER): Payer: Self-pay | Admitting: Vascular Surgery

## 2021-07-15 ENCOUNTER — Other Ambulatory Visit: Payer: Self-pay

## 2021-07-15 VITALS — BP 184/83 | HR 92 | Resp 16 | Wt 150.0 lb

## 2021-07-15 DIAGNOSIS — I2510 Atherosclerotic heart disease of native coronary artery without angina pectoris: Secondary | ICD-10-CM | POA: Diagnosis not present

## 2021-07-15 DIAGNOSIS — N186 End stage renal disease: Secondary | ICD-10-CM | POA: Diagnosis not present

## 2021-07-15 DIAGNOSIS — I10 Essential (primary) hypertension: Secondary | ICD-10-CM | POA: Diagnosis not present

## 2021-07-15 DIAGNOSIS — T829XXS Unspecified complication of cardiac and vascular prosthetic device, implant and graft, sequela: Secondary | ICD-10-CM | POA: Diagnosis not present

## 2021-07-15 DIAGNOSIS — E785 Hyperlipidemia, unspecified: Secondary | ICD-10-CM

## 2021-07-15 DIAGNOSIS — E1122 Type 2 diabetes mellitus with diabetic chronic kidney disease: Secondary | ICD-10-CM

## 2021-07-15 DIAGNOSIS — N183 Chronic kidney disease, stage 3 unspecified: Secondary | ICD-10-CM

## 2021-07-17 ENCOUNTER — Encounter (INDEPENDENT_AMBULATORY_CARE_PROVIDER_SITE_OTHER): Payer: Self-pay | Admitting: Vascular Surgery

## 2021-07-17 NOTE — Progress Notes (Signed)
MRN : [id number]  Felicia Butler is a 61 y.o. ([birth year]) female who presents with chief complaint of check access.  History of Present Illness:   The patient returns to the office for followup of their dialysis access. The function of the access has been stable. The patient denies increased bleeding time or increased recirculation. Patient denies difficulty with cannulation. The patient denies hand pain or other symptoms consistent with steal phenomena.  No significant arm swelling.   The patient denies redness or swelling at the access site. The patient denies fever or chills at home or while on dialysis.   The patient denies amaurosis fugax or recent TIA symptoms. There are no recent neurological changes noted. The patient denies claudication symptoms or rest pain symptoms. The patient denies history of DVT, PE or superficial thrombophlebitis. The patient denies recent episodes of angina or shortness of breath.   Duplex ultrasound of the left brachial cephalic fistula shows large aneurysmal areas, no hemodynamically significant stenosis.  Flow Volume 2130 cc/min  Current Meds  Medication Sig   amLODipine (NORVASC) 5 MG tablet Take 5 mg by mouth daily.   atorvastatin (LIPITOR) 80 MG tablet Take 80 mg by mouth daily.   AURYXIA 1 GM 210 MG(Fe) tablet Take 630 mg by mouth 3 (three) times daily.   cinacalcet (SENSIPAR) 30 MG tablet Take 30 mg by mouth daily.   famotidine (PEPCID) 20 MG tablet Take 20 mg by mouth 2 (two) times daily.   levothyroxine (SYNTHROID, LEVOTHROID) 75 MCG tablet Take 75 mcg by mouth daily before breakfast.   losartan (COZAAR) 25 MG tablet Take 25 mg by mouth daily.    Past Medical History:  Diagnosis Date   Chronic kidney disease    Hernia    Hyperlipidemia    Hypertension    Hypothyroidism    Thyroid disease     Past Surgical History:  Procedure Laterality Date   AV FISTULA PLACEMENT Left 01/18/2017   Procedure: ARTERIOVENOUS (AV) FISTULA CREATION (  BRACHIAL CEPHALIC );  Surgeon: Katha Cabal, MD;  Location: [facility];  Service: Vascular;  Laterality: Left;   BREAST BIOPSY Right 04-30-13   intraductal papilloma   BREAST BIOPSY Right 04-30-13   cyst aspiration   BREAST BIOPSY Right 05-27-13   subareolar duct excision   BREAST BIOPSY Left 05-27-13   subareolar duct excision   BREAST BIOPSY Left 08/11/2016   path pending   HERNIA REPAIR  1960's   PARTIAL HYSTERECTOMY     PERIPHERAL VASCULAR THROMBECTOMY Left 04/18/2017   Procedure: PERIPHERAL VASCULAR THROMBECTOMY;  Surgeon: Katha Cabal, MD;  Location: [facility];  Service: Cardiovascular;  Laterality: Left;    Social History Social History   Tobacco Use   Smoking status: Former    Years: 5.00    Types: Cigarettes    Quit date: 05/31/1983    Years since quitting: 38.1   Smokeless tobacco: Never  Substance Use Topics   Alcohol use: No   Drug use: No    Family History Family History  Problem Relation Age of Onset   Cancer Mother    Diabetes Father    Stroke Father     No Known Allergies   REVIEW OF SYSTEMS (Negative unless checked)  Constitutional: [] Weight loss  [] Fever  [] Chills Cardiac: [] Chest pain   [] Chest pressure   [] Palpitations   [] Shortness of breath when laying flat   [] Shortness of breath with exertion. Vascular:  [] Pain in legs with walking   []   Pain in legs at rest  [] History of DVT   [] Phlebitis   [] Swelling in legs   [] Varicose veins   [] Non-healing ulcers Pulmonary:   [] Uses home oxygen   [] Productive cough   [] Hemoptysis   [] Wheeze  [] COPD   [] Asthma Neurologic:  [] Dizziness   [] Seizures   [] History of stroke   [] History of TIA  [] Aphasia   [] Vissual changes   [] Weakness or numbness in arm   [] Weakness or numbness in leg Musculoskeletal:   [] Joint swelling   [] Joint pain   [] Low back pain Hematologic:  [] Easy bruising  [] Easy bleeding   [] Hypercoagulable state   [] Anemic Gastrointestinal:  [] Diarrhea   [] Vomiting   [] Gastroesophageal reflux/heartburn   [] Difficulty swallowing. Genitourinary:  [x] Chronic kidney disease   [] Difficult urination  [] Frequent urination   [] Blood in urine Skin:  [] Rashes   [] Ulcers  Psychological:  [] History of anxiety   []  History of major depression.  Physical Examination  Vitals:   07/15/21 1057  BP: (!) 184/83  Pulse: 92  Resp: 16  Weight: 150 lb (68 kg)   Body mass index is 28.34 kg/m. Gen: WD/WN, NAD Head: [place]/AT, No temporalis wasting.  Ear/Nose/Throat: Hearing grossly intact, nares w/o erythema or drainage Eyes: PER, EOMI, sclera nonicteric.  Neck: Supple, no gross masses or lesions.  No JVD.  Pulmonary:  Good air movement, no audible wheezing, no use of accessory muscles.  Cardiac: RRR, precordium non-hyperdynamic. Vascular:     left arm fistula large aneurysms with skin changes Vessel Right Left  Radial Palpable Palpable  Brachial Palpable Palpable  Gastrointestinal: soft, non-distended. No guarding/no peritoneal signs.  Musculoskeletal: M/S 5/5 throughout.  No deformity.  Neurologic: CN 2-12 intact. Pain and light touch intact in extremities.  Symmetrical.  Speech is fluent. Motor exam as listed above. Psychiatric: Judgment intact, Mood & affect appropriate for pt's clinical situation. Dermatologic: No rashes or ulcers noted.  No changes consistent with cellulitis.   CBC Lab Results  Component Value Date   WBC 10.0 06/21/2013   HGB 11.7 (L) 06/21/2013   HCT 36.3 06/21/2013   MCV 83 06/21/2013   PLT 338 06/21/2013    BMET    Component Value Date/Time   NA 140 01/10/2017 0828   NA 138 06/21/2013 0048   K 4.4 01/10/2017 0828   K 4.1 06/21/2013 0048   CL 113 (H) 01/10/2017 0828   CL 108 (H) 06/21/2013 0048   CO2 16 (L) 01/10/2017 0828   CO2 22 06/21/2013 0048   GLUCOSE 95 01/10/2017 0828   GLUCOSE 122 (H) 06/21/2013 0048   BUN 81 (H) 01/10/2017 0828   BUN 44 (H) 06/21/2013 0048   CREATININE 8.44 (H) 01/10/2017 0828   CREATININE 3.33  (H) 06/21/2013 0048   CALCIUM 10.0 01/10/2017 0828   CALCIUM 9.6 06/21/2013 0048   GFRNONAA 5 (L) 01/10/2017 0828   GFRNONAA 15 (L) 06/21/2013 0048   GFRAA 5 (L) 01/10/2017 0828   GFRAA 17 (L) 06/21/2013 0048   CrCl cannot be calculated (Patient's most recent lab result is older than the maximum 21 days allowed.).  COAG Lab Results  Component Value Date   INR 0.95 01/10/2017    Radiology No results found.   Assessment/Plan 1. End stage renal disease ([place]) Recommend:  The patient is doing well and currently has adequate dialysis access. The patient's dialysis center is not reporting any access issues.  Duplex ultrasound of the left brachial cephalic fistula shows large aneurysmal areas, no hemodynamically significant stenosis.  Flow  Volume 2130 cc/min  Flow pattern is stable when compared to the prior ultrasound.  I did discuss revision given the aneurysms and skin changes but she is happy with the way they are access her fistula for now.  The patient should have a duplex ultrasound of the dialysis access in 6 months.  The patient will follow-up with me in the office after each ultrasound    - VAS [place] (AVF, AVG); Future  2. Complication of vascular access for dialysis, sequela Recommend:  The patient is doing well and currently has adequate dialysis access. The patient's dialysis center is not reporting any access issues.  Duplex ultrasound of the left brachial cephalic fistula shows large aneurysmal areas, no hemodynamically significant stenosis.  Flow Volume 2130 cc/min  Flow pattern is stable when compared to the prior ultrasound.  I did discuss revision given the aneurysms and skin changes but she is happy with the way they are access her fistula for now.  The patient should have a duplex ultrasound of the dialysis access in 6 months.  The patient will follow-up with me in the office after each ultrasound  - VAS [place]  (AVF, AVG); Future  3. CAD in native artery Continue cardiac and antihypertensive medications as already ordered and reviewed, no changes at this time.  Continue statin as ordered and reviewed, no changes at this time  Nitrates PRN for chest pain   4. Essential hypertension Continue antihypertensive medications as already ordered, these medications have been reviewed and there are no changes at this time.   5. Type 2 diabetes mellitus with stage 3 chronic kidney disease, unspecified whether long term insulin use, unspecified whether stage 3a or 3b CKD ([place]) Continue hypoglycemic medications as already ordered, these medications have been reviewed and there are no changes at this time.  Hgb A1C to be monitored as already arranged by primary service Continue hypoglycemic medications as already ordered, these medications have been reviewed and there are no changes at this time.  Hgb A1C to be monitored as already arranged by primary service  6. Hyperlipidemia, unspecified hyperlipidemia type Continue statin as ordered and reviewed, no changes at this time    [name], MD  07/17/2021 4:58 PM

## 2021-10-15 ENCOUNTER — Ambulatory Visit: Payer: Medicare Other | Attending: Neurology

## 2021-10-15 DIAGNOSIS — G4733 Obstructive sleep apnea (adult) (pediatric): Secondary | ICD-10-CM | POA: Insufficient documentation

## 2021-11-02 ENCOUNTER — Other Ambulatory Visit: Payer: Self-pay | Admitting: Family Medicine

## 2021-11-02 DIAGNOSIS — Z1231 Encounter for screening mammogram for malignant neoplasm of breast: Secondary | ICD-10-CM

## 2021-12-02 ENCOUNTER — Ambulatory Visit
Admission: RE | Admit: 2021-12-02 | Discharge: 2021-12-02 | Disposition: A | Discharge status: Home / Self Care | Payer: Medicare Other | Source: Ambulatory Visit | Attending: Family Medicine | Admitting: Family Medicine

## 2021-12-02 DIAGNOSIS — Z1231 Encounter for screening mammogram for malignant neoplasm of breast: Secondary | ICD-10-CM | POA: Diagnosis present

## 2021-12-09 DIAGNOSIS — I5030 Unspecified diastolic (congestive) heart failure: Secondary | ICD-10-CM

## 2021-12-09 DIAGNOSIS — I272 Pulmonary hypertension, unspecified: Secondary | ICD-10-CM

## 2021-12-09 HISTORY — DX: Unspecified diastolic (congestive) heart failure: I50.30

## 2021-12-09 HISTORY — DX: Pulmonary hypertension, unspecified: I27.20

## 2022-01-12 ENCOUNTER — Telehealth (INDEPENDENT_AMBULATORY_CARE_PROVIDER_SITE_OTHER): Payer: Self-pay

## 2022-01-12 ENCOUNTER — Ambulatory Visit (INDEPENDENT_AMBULATORY_CARE_PROVIDER_SITE_OTHER): Payer: Medicare Other

## 2022-01-12 ENCOUNTER — Ambulatory Visit (INDEPENDENT_AMBULATORY_CARE_PROVIDER_SITE_OTHER): Payer: Medicare Other | Admitting: Nurse Practitioner

## 2022-01-12 ENCOUNTER — Encounter (INDEPENDENT_AMBULATORY_CARE_PROVIDER_SITE_OTHER): Payer: Self-pay | Admitting: Nurse Practitioner

## 2022-01-12 VITALS — BP 178/81 | HR 78 | Resp 18 | Ht 61.0 in | Wt 144.4 lb

## 2022-01-12 DIAGNOSIS — N183 Chronic kidney disease, stage 3 unspecified: Secondary | ICD-10-CM

## 2022-01-12 DIAGNOSIS — T829XXS Unspecified complication of cardiac and vascular prosthetic device, implant and graft, sequela: Secondary | ICD-10-CM

## 2022-01-12 DIAGNOSIS — E1122 Type 2 diabetes mellitus with diabetic chronic kidney disease: Secondary | ICD-10-CM | POA: Diagnosis not present

## 2022-01-12 DIAGNOSIS — N186 End stage renal disease: Secondary | ICD-10-CM

## 2022-01-12 DIAGNOSIS — I10 Essential (primary) hypertension: Secondary | ICD-10-CM | POA: Diagnosis not present

## 2022-01-12 NOTE — Telephone Encounter (Signed)
Spoke with the patient and she is scheduled with Dr. Delana Meyer on 01/18/22 for a left arm fistulagram with permcath insertion and 2:15 pm arrival time to the [facility]. Pre-procedure instructions were discussed and will be mailed.

## 2022-01-17 ENCOUNTER — Encounter (INDEPENDENT_AMBULATORY_CARE_PROVIDER_SITE_OTHER): Payer: Self-pay | Admitting: Nurse Practitioner

## 2022-01-17 NOTE — H&P (View-Only) (Signed)
Subjective:    Patient ID: Felicia Butler, female    DOB: 22-Dec-1960, 61 y.o.   MRN: [id number] No chief complaint on file.   Felicia Butler is a 61 year old female who presents today for follow-up evaluation for her left upper extremity brachiocephalic AV fistula.  She notes that she has been having some issues with bleeding recently.  She also has a large aneurysmal access and they are beginning to show some preulcerative signs with worsening skin threatening.  She denies any frank bleeding currently.  Today the patient has a flow volume of 3684 up from previous flow volume of 2130 in February.    Review of Systems  Hematological:  Bruises/bleeds easily.  All other systems reviewed and are negative.      Objective:   Physical Exam Vitals reviewed.  HENT:     Head: Normocephalic.  Cardiovascular:     Rate and Rhythm: Normal rate.     Pulses:          Radial pulses are 2+ on the left side.     Arteriovenous access: Left arteriovenous access is present.    Comments: Large aneurysmal fistula Pulmonary:     Effort: Pulmonary effort is normal.  Skin:    General: Skin is warm and dry.  Neurological:     Mental Status: She is alert and oriented to person, place, and time.  Psychiatric:        Mood and Affect: Mood normal.        Behavior: Behavior normal.        Thought Content: Thought content normal.        Judgment: Judgment normal.     BP (!) 178/81 (BP Location: Right Arm)   Pulse 78   Resp 18   Ht '5\' 1"'$  (1.549 m)   Wt 144 lb 6.4 oz (65.5 kg)   LMP 11/18/1996 (Approximate)   BMI 27.28 kg/m   Past Medical History:  Diagnosis Date   Chronic kidney disease    Hernia    Hyperlipidemia    Hypertension    Hypothyroidism    Thyroid disease     Social History   Socioeconomic History   Marital status: Divorced    Spouse name: Not on file   Number of children: Not on file   Years of education: Not on file   Highest education level: Not on file  Occupational  History   Not on file  Tobacco Use   Smoking status: Former    Years: 5.00    Types: Cigarettes    Quit date: 05/31/1983    Years since quitting: 38.6   Smokeless tobacco: Never  Substance and Sexual Activity   Alcohol use: No   Drug use: No   Sexual activity: Not on file  Other Topics Concern   Not on file  Social History Narrative   Not on file   Social Determinants of Health   Financial Resource Strain: Not on file  Food Insecurity: Not on file  Transportation Needs: Not on file  Physical Activity: Not on file  Stress: Not on file  Social Connections: Not on file  Intimate Partner Violence: Not on file    Past Surgical History:  Procedure Laterality Date   AV FISTULA PLACEMENT Left 01/18/2017   Procedure: ARTERIOVENOUS (AV) FISTULA CREATION ( BRACHIAL CEPHALIC );  Surgeon: Katha Cabal, MD;  Location: [facility];  Service: Vascular;  Laterality: Left;   BREAST BIOPSY Right 04-30-13   intraductal papilloma  BREAST BIOPSY Right 04-30-13   cyst aspiration   BREAST BIOPSY Right 05-27-13   subareolar duct excision   BREAST BIOPSY Left 05-27-13   subareolar duct excision   BREAST BIOPSY Left 08/11/2016   path pending   HERNIA REPAIR  1960's   PARTIAL HYSTERECTOMY     PERIPHERAL VASCULAR THROMBECTOMY Left 04/18/2017   Procedure: PERIPHERAL VASCULAR THROMBECTOMY;  Surgeon: Katha Cabal, MD;  Location: [facility];  Service: Cardiovascular;  Laterality: Left;    Family History  Problem Relation Age of Onset   Cancer Mother    Diabetes Father    Stroke Father     No Known Allergies     Latest Ref Rng & Units 06/21/2013   12:48 AM  CBC  WBC 3.6 - 11.0 x10 3/mm 3 10.0   Hemoglobin 12.0 - 16.0 g/dL 11.7   Hematocrit 35.0 - 47.0 % 36.3   Platelets 150 - 440 x10 3/mm 3 338       CMP     Component Value Date/Time   NA 140 01/10/2017 0828   NA 138 06/21/2013 0048   K 4.4 01/10/2017 0828   K 4.1 06/21/2013 0048   CL 113 (H) 01/10/2017 0828    CL 108 (H) 06/21/2013 0048   CO2 16 (L) 01/10/2017 0828   CO2 22 06/21/2013 0048   GLUCOSE 95 01/10/2017 0828   GLUCOSE 122 (H) 06/21/2013 0048   BUN 81 (H) 01/10/2017 0828   BUN 44 (H) 06/21/2013 0048   CREATININE 8.44 (H) 01/10/2017 0828   CREATININE 3.33 (H) 06/21/2013 0048   CALCIUM 10.0 01/10/2017 0828   CALCIUM 9.6 06/21/2013 0048   PROT 7.4 01/10/2017 0828   PROT 8.2 06/21/2013 0048   ALBUMIN 4.1 01/10/2017 0828   ALBUMIN 3.4 06/21/2013 0048   AST 13 (L) 01/10/2017 0828   AST 20 06/21/2013 0048   ALT 12 (L) 01/10/2017 0828   ALT 19 06/21/2013 0048   ALKPHOS 139 (H) 01/10/2017 0828   ALKPHOS 142 (H) 06/21/2013 0048   BILITOT 0.7 01/10/2017 0828   BILITOT 0.2 06/21/2013 0048   GFRNONAA 5 (L) 01/10/2017 0828   GFRNONAA 15 (L) 06/21/2013 0048   GFRAA 5 (L) 01/10/2017 0828   GFRAA 17 (L) 06/21/2013 0048     No results found.     Assessment & Plan:   1. End stage renal disease ([place]) Based on the skin threatening and worsening preulcerative areas on her fistula is currently time to revise this access.  The first I will have the patient undergo a fistulogram to correct any possible central stenosis as well as place a PermCath.  The next step would be resection of her aneurysmal areas with a jump graft placement.  I have discussed the risk benefits fits and alternatives and the patient is agreeable to proceed.  2. Essential hypertension Continue antihypertensive medications as already ordered, these medications have been reviewed and there are no changes at this time.   3. Type 2 diabetes mellitus with stage 3 chronic kidney disease, unspecified whether long term insulin use, unspecified whether stage 3a or 3b CKD ([place]) Continue hypoglycemic medications as already ordered, these medications have been reviewed and there are no changes at this time.  Hgb A1C to be monitored as already arranged by primary service    Current Outpatient Medications on File Prior to Visit   Medication Sig Dispense Refill   amLODipine (NORVASC) 5 MG tablet Take 5 mg by mouth daily.  atorvastatin (LIPITOR) 80 MG tablet Take 80 mg by mouth daily.     AURYXIA 1 GM 210 MG(Fe) tablet Take 630 mg by mouth 3 (three) times daily.     cinacalcet (SENSIPAR) 30 MG tablet Take 30 mg by mouth daily.     famotidine (PEPCID) 20 MG tablet Take 20 mg by mouth 2 (two) times daily.     hydrALAZINE (APRESOLINE) 50 MG tablet Take 50 mg by mouth 2 (two) times daily.     levothyroxine (SYNTHROID, LEVOTHROID) 75 MCG tablet Take 75 mcg by mouth daily before breakfast.     losartan (COZAAR) 25 MG tablet Take 25 mg by mouth daily.     No current facility-administered medications on file prior to visit.    There are no Patient Instructions on file for this visit. No follow-ups on file.   Kris Hartmann, NP

## 2022-01-17 NOTE — Progress Notes (Signed)
Subjective:    Patient ID: Felicia Butler, female    DOB: 1961/05/28, 61 y.o.   MRN: [id number] No chief complaint on file.   Felicia Butler is a 61 year old female who presents today for follow-up evaluation for her left upper extremity brachiocephalic AV fistula.  She notes that she has been having some issues with bleeding recently.  She also has a large aneurysmal access and they are beginning to show some preulcerative signs with worsening skin threatening.  She denies any frank bleeding currently.  Today the patient has a flow volume of 3684 up from previous flow volume of 2130 in February.    Review of Systems  Hematological:  Bruises/bleeds easily.  All other systems reviewed and are negative.      Objective:   Physical Exam Vitals reviewed.  HENT:     Head: Normocephalic.  Cardiovascular:     Rate and Rhythm: Normal rate.     Pulses:          Radial pulses are 2+ on the left side.     Arteriovenous access: Left arteriovenous access is present.    Comments: Large aneurysmal fistula Pulmonary:     Effort: Pulmonary effort is normal.  Skin:    General: Skin is warm and dry.  Neurological:     Mental Status: She is alert and oriented to person, place, and time.  Psychiatric:        Mood and Affect: Mood normal.        Behavior: Behavior normal.        Thought Content: Thought content normal.        Judgment: Judgment normal.     BP (!) 178/81 (BP Location: Right Arm)   Pulse 78   Resp 18   Ht '5\' 1"'$  (1.549 m)   Wt 144 lb 6.4 oz (65.5 kg)   LMP 11/18/1996 (Approximate)   BMI 27.28 kg/m   Past Medical History:  Diagnosis Date   Chronic kidney disease    Hernia    Hyperlipidemia    Hypertension    Hypothyroidism    Thyroid disease     Social History   Socioeconomic History   Marital status: Divorced    Spouse name: Not on file   Number of children: Not on file   Years of education: Not on file   Highest education level: Not on file  Occupational  History   Not on file  Tobacco Use   Smoking status: Former    Years: 5.00    Types: Cigarettes    Quit date: 05/31/1983    Years since quitting: 38.6   Smokeless tobacco: Never  Substance and Sexual Activity   Alcohol use: No   Drug use: No   Sexual activity: Not on file  Other Topics Concern   Not on file  Social History Narrative   Not on file   Social Determinants of Health   Financial Resource Strain: Not on file  Food Insecurity: Not on file  Transportation Needs: Not on file  Physical Activity: Not on file  Stress: Not on file  Social Connections: Not on file  Intimate Partner Violence: Not on file    Past Surgical History:  Procedure Laterality Date   AV FISTULA PLACEMENT Left 01/18/2017   Procedure: ARTERIOVENOUS (AV) FISTULA CREATION ( BRACHIAL CEPHALIC );  Surgeon: Katha Cabal, MD;  Location: [facility];  Service: Vascular;  Laterality: Left;   BREAST BIOPSY Right 04-30-13   intraductal papilloma  BREAST BIOPSY Right 04-30-13   cyst aspiration   BREAST BIOPSY Right 05-27-13   subareolar duct excision   BREAST BIOPSY Left 05-27-13   subareolar duct excision   BREAST BIOPSY Left 08/11/2016   path pending   HERNIA REPAIR  1960's   PARTIAL HYSTERECTOMY     PERIPHERAL VASCULAR THROMBECTOMY Left 04/18/2017   Procedure: PERIPHERAL VASCULAR THROMBECTOMY;  Surgeon: Katha Cabal, MD;  Location: [facility];  Service: Cardiovascular;  Laterality: Left;    Family History  Problem Relation Age of Onset   Cancer Mother    Diabetes Father    Stroke Father     No Known Allergies     Latest Ref Rng & Units 06/21/2013   12:48 AM  CBC  WBC 3.6 - 11.0 x10 3/mm 3 10.0   Hemoglobin 12.0 - 16.0 g/dL 11.7   Hematocrit 35.0 - 47.0 % 36.3   Platelets 150 - 440 x10 3/mm 3 338       CMP     Component Value Date/Time   NA 140 01/10/2017 0828   NA 138 06/21/2013 0048   K 4.4 01/10/2017 0828   K 4.1 06/21/2013 0048   CL 113 (H) 01/10/2017 0828    CL 108 (H) 06/21/2013 0048   CO2 16 (L) 01/10/2017 0828   CO2 22 06/21/2013 0048   GLUCOSE 95 01/10/2017 0828   GLUCOSE 122 (H) 06/21/2013 0048   BUN 81 (H) 01/10/2017 0828   BUN 44 (H) 06/21/2013 0048   CREATININE 8.44 (H) 01/10/2017 0828   CREATININE 3.33 (H) 06/21/2013 0048   CALCIUM 10.0 01/10/2017 0828   CALCIUM 9.6 06/21/2013 0048   PROT 7.4 01/10/2017 0828   PROT 8.2 06/21/2013 0048   ALBUMIN 4.1 01/10/2017 0828   ALBUMIN 3.4 06/21/2013 0048   AST 13 (L) 01/10/2017 0828   AST 20 06/21/2013 0048   ALT 12 (L) 01/10/2017 0828   ALT 19 06/21/2013 0048   ALKPHOS 139 (H) 01/10/2017 0828   ALKPHOS 142 (H) 06/21/2013 0048   BILITOT 0.7 01/10/2017 0828   BILITOT 0.2 06/21/2013 0048   GFRNONAA 5 (L) 01/10/2017 0828   GFRNONAA 15 (L) 06/21/2013 0048   GFRAA 5 (L) 01/10/2017 0828   GFRAA 17 (L) 06/21/2013 0048     No results found.     Assessment & Plan:   1. End stage renal disease ([place]) Based on the skin threatening and worsening preulcerative areas on her fistula is currently time to revise this access.  The first I will have the patient undergo a fistulogram to correct any possible central stenosis as well as place a PermCath.  The next step would be resection of her aneurysmal areas with a jump graft placement.  I have discussed the risk benefits fits and alternatives and the patient is agreeable to proceed.  2. Essential hypertension Continue antihypertensive medications as already ordered, these medications have been reviewed and there are no changes at this time.   3. Type 2 diabetes mellitus with stage 3 chronic kidney disease, unspecified whether long term insulin use, unspecified whether stage 3a or 3b CKD ([place]) Continue hypoglycemic medications as already ordered, these medications have been reviewed and there are no changes at this time.  Hgb A1C to be monitored as already arranged by primary service    Current Outpatient Medications on File Prior to Visit   Medication Sig Dispense Refill   amLODipine (NORVASC) 5 MG tablet Take 5 mg by mouth daily.  atorvastatin (LIPITOR) 80 MG tablet Take 80 mg by mouth daily.     AURYXIA 1 GM 210 MG(Fe) tablet Take 630 mg by mouth 3 (three) times daily.     cinacalcet (SENSIPAR) 30 MG tablet Take 30 mg by mouth daily.     famotidine (PEPCID) 20 MG tablet Take 20 mg by mouth 2 (two) times daily.     hydrALAZINE (APRESOLINE) 50 MG tablet Take 50 mg by mouth 2 (two) times daily.     levothyroxine (SYNTHROID, LEVOTHROID) 75 MCG tablet Take 75 mcg by mouth daily before breakfast.     losartan (COZAAR) 25 MG tablet Take 25 mg by mouth daily.     No current facility-administered medications on file prior to visit.    There are no Patient Instructions on file for this visit. No follow-ups on file.   Kris Hartmann, NP

## 2022-01-18 ENCOUNTER — Ambulatory Visit
Admission: RE | Admit: 2022-01-18 | Discharge: 2022-01-18 | Disposition: A | Discharge status: Home / Self Care | Payer: Medicare Other | Attending: Vascular Surgery | Admitting: Vascular Surgery

## 2022-01-18 DIAGNOSIS — N186 End stage renal disease: Secondary | ICD-10-CM

## 2022-01-18 MED ORDER — HYDROMORPHONE HCL 1 MG/ML IJ SOLN: 1.0000 mg | Freq: Once | INTRAMUSCULAR | Status: DC | PRN

## 2022-01-18 MED ORDER — MIDAZOLAM HCL 2 MG/ML PO SYRP: 8.0000 mg | ORAL_SOLUTION | Freq: Once | ORAL | Status: DC | PRN

## 2022-01-18 MED ORDER — FAMOTIDINE 20 MG PO TABS: 40.0000 mg | ORAL_TABLET | Freq: Once | ORAL | Status: DC | PRN

## 2022-01-18 MED ORDER — CEFAZOLIN SODIUM-DEXTROSE 1-4 GM/50ML-% IV SOLN
INTRAVENOUS | Status: AC
  Filled 2022-01-18: qty 50

## 2022-01-18 MED ORDER — ONDANSETRON HCL 4 MG/2ML IJ SOLN: 4.0000 mg | Freq: Four times a day (QID) | INTRAMUSCULAR | Status: DC | PRN

## 2022-01-18 MED ORDER — DIPHENHYDRAMINE HCL 50 MG/ML IJ SOLN: 50.0000 mg | Freq: Once | INTRAMUSCULAR | Status: DC | PRN

## 2022-01-18 MED ORDER — SODIUM CHLORIDE 0.9 % IV SOLN: INTRAVENOUS | Status: DC

## 2022-01-18 MED ORDER — CEFAZOLIN SODIUM-DEXTROSE 1-4 GM/50ML-% IV SOLN: 1.0000 g | INTRAVENOUS | Status: DC

## 2022-01-18 MED ORDER — METHYLPREDNISOLONE SODIUM SUCC 125 MG IJ SOLR: 125.0000 mg | Freq: Once | INTRAMUSCULAR | Status: DC | PRN

## 2022-01-20 ENCOUNTER — Telehealth (INDEPENDENT_AMBULATORY_CARE_PROVIDER_SITE_OTHER): Payer: Self-pay

## 2022-01-20 NOTE — Progress Notes (Unsigned)
Pt did not show for scheduled appointment.  

## 2022-01-20 NOTE — Telephone Encounter (Signed)
Spoke with the patient and she has been rescheduled to 02/01/22 with a 8:00 am arrival time to the [facility] for her left arm fistulagram and permcath insertion with Dr. Delana Meyer. Pre-procedure instructions were discussed and will be mailed.

## 2022-01-24 ENCOUNTER — Ambulatory Visit: Payer: Medicare Other | Admitting: Internal Medicine

## 2022-01-25 DIAGNOSIS — N186 End stage renal disease: Secondary | ICD-10-CM

## 2022-01-30 DIAGNOSIS — T782XXA Anaphylactic shock, unspecified, initial encounter: Secondary | ICD-10-CM | POA: Insufficient documentation

## 2022-01-30 DIAGNOSIS — T7840XA Allergy, unspecified, initial encounter: Secondary | ICD-10-CM | POA: Insufficient documentation

## 2022-02-01 ENCOUNTER — Other Ambulatory Visit: Payer: Self-pay

## 2022-02-01 ENCOUNTER — Ambulatory Visit
Admission: RE | Admit: 2022-02-01 | Discharge: 2022-02-01 | Disposition: A | Discharge status: Home / Self Care | Payer: Medicare Other | Attending: Vascular Surgery | Admitting: Vascular Surgery

## 2022-02-01 ENCOUNTER — Encounter: Payer: Self-pay | Admitting: Vascular Surgery

## 2022-02-01 ENCOUNTER — Encounter
Admission: RE | Disposition: A | Discharge status: Home / Self Care | Payer: Self-pay | Source: Home / Self Care | Attending: Vascular Surgery

## 2022-02-01 DIAGNOSIS — Z87891 Personal history of nicotine dependence: Secondary | ICD-10-CM | POA: Insufficient documentation

## 2022-02-01 DIAGNOSIS — I120 Hypertensive chronic kidney disease with stage 5 chronic kidney disease or end stage renal disease: Secondary | ICD-10-CM | POA: Diagnosis not present

## 2022-02-01 DIAGNOSIS — E1122 Type 2 diabetes mellitus with diabetic chronic kidney disease: Secondary | ICD-10-CM | POA: Insufficient documentation

## 2022-02-01 DIAGNOSIS — N186 End stage renal disease: Secondary | ICD-10-CM | POA: Insufficient documentation

## 2022-02-01 DIAGNOSIS — I871 Compression of vein: Secondary | ICD-10-CM

## 2022-02-01 DIAGNOSIS — Y841 Kidney dialysis as the cause of abnormal reaction of the patient, or of later complication, without mention of misadventure at the time of the procedure: Secondary | ICD-10-CM | POA: Insufficient documentation

## 2022-02-01 DIAGNOSIS — T82898A Other specified complication of vascular prosthetic devices, implants and grafts, initial encounter: Secondary | ICD-10-CM | POA: Diagnosis not present

## 2022-02-01 DIAGNOSIS — T82858A Stenosis of vascular prosthetic devices, implants and grafts, initial encounter: Secondary | ICD-10-CM | POA: Diagnosis not present

## 2022-02-01 DIAGNOSIS — Z992 Dependence on renal dialysis: Secondary | ICD-10-CM | POA: Diagnosis not present

## 2022-02-01 HISTORY — PX: A/V FISTULAGRAM: CATH118298

## 2022-02-01 HISTORY — PX: DIALYSIS/PERMA CATHETER INSERTION: CATH118288

## 2022-02-01 LAB — POTASSIUM (ARMC VASCULAR LAB ONLY): Potassium (ARMC vascular lab): 3.6 mmol/L (ref 3.5–5.1)

## 2022-02-01 SURGERY — A/V FISTULAGRAM
Anesthesia: Moderate Sedation

## 2022-02-01 MED ORDER — CEFAZOLIN SODIUM-DEXTROSE 2-4 GM/100ML-% IV SOLN: 2.0000 g | INTRAVENOUS | Status: DC

## 2022-02-01 MED ORDER — ONDANSETRON HCL 4 MG/2ML IJ SOLN: 4.0000 mg | Freq: Four times a day (QID) | INTRAMUSCULAR | Status: DC | PRN

## 2022-02-01 MED ORDER — IODIXANOL 320 MG/ML IV SOLN
INTRAVENOUS | Status: DC | PRN
  Administered 2022-02-01: 30 mL via INTRA_ARTERIAL

## 2022-02-01 MED ORDER — HEPARIN SODIUM (PORCINE) 1000 UNIT/ML IJ SOLN
INTRAMUSCULAR | Status: DC | PRN
  Administered 2022-02-01: 4000 [IU] via INTRAVENOUS

## 2022-02-01 MED ORDER — METHYLPREDNISOLONE SODIUM SUCC 125 MG IJ SOLR: 125.0000 mg | Freq: Once | INTRAMUSCULAR | Status: DC | PRN

## 2022-02-01 MED ORDER — MIDAZOLAM HCL 2 MG/2ML IJ SOLN
INTRAMUSCULAR | Status: AC
  Filled 2022-02-01: qty 4

## 2022-02-01 MED ORDER — MIDAZOLAM HCL 2 MG/2ML IJ SOLN
INTRAMUSCULAR | Status: DC | PRN
  Administered 2022-02-01: 1 mg via INTRAVENOUS
  Administered 2022-02-01: 2 mg via INTRAVENOUS

## 2022-02-01 MED ORDER — MIDAZOLAM HCL 2 MG/ML PO SYRP
8.0000 mg | ORAL_SOLUTION | Freq: Once | ORAL | Status: DC | PRN
Start: 2022-02-01 — End: 2022-02-01

## 2022-02-01 MED ORDER — HEPARIN SODIUM (PORCINE) 1000 UNIT/ML IJ SOLN
INTRAMUSCULAR | Status: AC
  Filled 2022-02-01: qty 10

## 2022-02-01 MED ORDER — DIPHENHYDRAMINE HCL 50 MG/ML IJ SOLN: 50.0000 mg | Freq: Once | INTRAMUSCULAR | Status: DC | PRN

## 2022-02-01 MED ORDER — SODIUM CHLORIDE 0.9 % IV SOLN: INTRAVENOUS | Status: DC

## 2022-02-01 MED ORDER — FENTANYL CITRATE (PF) 100 MCG/2ML IJ SOLN
INTRAMUSCULAR | Status: DC | PRN
  Administered 2022-02-01: 50 ug via INTRAVENOUS
  Administered 2022-02-01: 25 ug via INTRAVENOUS

## 2022-02-01 MED ORDER — FENTANYL CITRATE PF 50 MCG/ML IJ SOSY
PREFILLED_SYRINGE | INTRAMUSCULAR | Status: AC
  Filled 2022-02-01: qty 2

## 2022-02-01 MED ORDER — FAMOTIDINE 20 MG PO TABS: 40.0000 mg | ORAL_TABLET | Freq: Once | ORAL | Status: DC | PRN

## 2022-02-01 MED ORDER — HYDROMORPHONE HCL 1 MG/ML IJ SOLN: 1.0000 mg | Freq: Once | INTRAMUSCULAR | Status: DC | PRN

## 2022-02-01 MED ORDER — CEFAZOLIN SODIUM-DEXTROSE 1-4 GM/50ML-% IV SOLN: 1.0000 g | INTRAVENOUS | Status: AC

## 2022-02-01 MED ORDER — CEFAZOLIN SODIUM-DEXTROSE 1-4 GM/50ML-% IV SOLN
INTRAVENOUS | Status: AC
  Administered 2022-02-01: 1 g via INTRAVENOUS
  Filled 2022-02-01: qty 50

## 2022-02-01 SURGICAL SUPPLY — 27 items
BALLN DORADO 8X40X80 (BALLOONS) ×1
BALLN ULTRVRSE 9X40X75C (BALLOONS) ×1
BALLOON DORADO 8X40X80 (BALLOONS) IMPLANT
BALLOON ULTRVRSE 9X40X75C (BALLOONS) IMPLANT
CATH BEACON 5 .035 40 KMP TP (CATHETERS) IMPLANT
CATH BEACON 5 .038 40 KMP TP (CATHETERS) ×1
CATH CANNON HEMO 15FR 19 (HEMODIALYSIS SUPPLIES) IMPLANT
DERMABOND ADVANCED (GAUZE/BANDAGES/DRESSINGS) ×1
DERMABOND ADVANCED .7 DNX12 (GAUZE/BANDAGES/DRESSINGS) IMPLANT
DEVICE TORQUE (MISCELLANEOUS) IMPLANT
DRAPE BRACHIAL (DRAPES) IMPLANT
DRAPE INCISE IOBAN 66X45 STRL (DRAPES) IMPLANT
GLIDEWIRE STIFF .35X180X3 HYDR (WIRE) IMPLANT
GUIDEWIRE ANGLED .035 180CM (WIRE) IMPLANT
INTRODUCER 7FR 23CM (INTRODUCER) IMPLANT
KIT ENCORE 26 ADVANTAGE (KITS) IMPLANT
NDL ENTRY 21GA 7CM ECHOTIP (NEEDLE) IMPLANT
NEEDLE ENTRY 21GA 7CM ECHOTIP (NEEDLE) ×1 IMPLANT
PACK ANGIOGRAPHY (CUSTOM PROCEDURE TRAY) ×1 IMPLANT
SET INTRO CAPELLA COAXIAL (SET/KITS/TRAYS/PACK) IMPLANT
SHEATH BRITE TIP 6FRX5.5 (SHEATH) IMPLANT
SHEATH DESTINATION 8F 45CM (SHEATH) IMPLANT
SHEATH PROBE COVER 6X72 (BAG) IMPLANT
STENT VIABAHN 9X5X120 (Permanent Stent) IMPLANT
SUT MNCRL AB 4-0 PS2 18 (SUTURE) IMPLANT
SUT SILK 0 FSL (SUTURE) IMPLANT
WIRE G V18X300CM (WIRE) IMPLANT

## 2022-02-01 NOTE — Op Note (Signed)
OPERATIVE NOTE   PROCEDURE: Contrast injection left brachiocephalic AV access Percutaneous transluminal angioplasty and stent placement proximal cephalic vein left brachiocephalic fistula. Insertion right external jugular vein tunneled catheter  PRE-OPERATIVE DIAGNOSIS: Complication of dialysis access                                                       End Stage Renal Disease  POST-OPERATIVE DIAGNOSIS: same as above   SURGEON: Katha Cabal, M.D.  ANESTHESIA: Conscious sedation was administered under my direct supervision by the interventional radiology RN. IV Versed plus fentanyl were utilized. Continuous ECG, pulse oximetry and blood pressure was monitored throughout the entire procedure.  Conscious sedation was for a total of 65 minutes.  ESTIMATED BLOOD LOSS: minimal  FINDING(S): There is a greater than 80% stenosis of the cephalic vein at the cephalic subclavian confluence.  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  Also of note the right internal jugular vein is occluded there are extensive collaterals noted in the right neck  SPECIMEN(S):  None  CONTRAST: 30 cc  FLUOROSCOPY TIME: 6.9 minutes  INDICATIONS: Felicia Butler is a 61 y.o. female who  presents with malfunctioning left AV access.  The patient is scheduled for angiography with possible intervention of the AV access.  The patient is aware the risks include but are not limited to: bleeding, infection, thrombosis of the cannulated access, and possible anaphylactic reaction to the contrast.  The patient acknowledges if the access can not be salvaged a tunneled catheter will be needed and will be placed during this procedure.  The patient is aware of the risks of the procedure and elects to proceed with the angiogram and intervention.  DESCRIPTION: After full informed written consent was obtained, the patient was brought back to the Special Procedure suite and placed supine position.  Appropriate cardiopulmonary  monitors were placed.  The left arm was prepped and draped in the standard fashion.  Appropriate timeout is called. The left brachiocephalic fistula was cannulated with a micropuncture needle.  Cannulation was performed with ultrasound guidance. Ultrasound was placed in a sterile sleeve, the AV access was interrogated and noted to be echolucent and compressible indicating patency. Image was recorded for the permanent record. The puncture is performed under continuous ultrasound visualization.   The microwire was advanced and the needle was exchanged for  a microsheath.  The J-wire was then advanced and a 6 Fr sheath inserted.  Hand injections were completed to image the access from the arterial anastomosis through the entire access.  The central venous structures were also imaged by hand injections.  Based on the images,  4000 units of heparin was given and a wire was negotiated through the strictures within the venous portion of the graft.  An 9 mm x 50 mm Viabahn was deployed across the stenoses and postdilated with an 8 mm Dorado balloon followed by dilatation using a 9 mm x 40 mm Ultraverse inflated to 16 atm for 1 minute.  Follow-up imaging demonstrates complete resolution of the stricture with less than 10% residual stenosis with rapid flow of contrast through the fistula, the central venous anatomy is preserved.  A 4-0 Monocryl purse-string suture was sewn around the sheath.  The sheath was removed and light pressure was applied.  A sterile bandage was applied to the puncture site.  Having  successfully treated the venous outflow in preparation for revision of her aneurysmal access I then turned to placing a tunneled catheter.  The right neck and chest wall were prepped and draped in sterile fashion.  Ultrasound placed in a sterile sleeve.  Ultrasound was used to evaluate the jugular vein.  Jugular vein was noted to be sclerotic and filled with echolucent material was noncompressible.  Numerous  collaterals were noted.  I was able to trace the external jugular vein down in and visualized the confluence of the external jugular with the subclavian.  1% lidocaine was infiltrated into the soft tissues and under direct ultrasound visualization was able to angle the microneedle so that it entered the external jugular vein in a direction that would allow passage into the subclavian in an antegrade direction.  Microwire was then advanced without difficulty under fluoroscopic guidance and was noted to be in the right ventricle.  I further infiltrated the neck and chest wall with lidocaine.  A counterincision was then made at the wire insertion site and a pocket created with a hemostat.  The micropuncture sheath was then advanced followed by a J-wire which was advanced under fluoroscopic guidance and positioned in the distal inferior vena cava.  Serial dilatation was performed over the J-wire and then the peel-away sheath was inserted.  19 cm tip to cuff catheter was advanced through the peel-away sheath and the peel-away sheath was removed.  Catheter was approximated to the chest wall under fluoroscopic guidance and an exit site selected.  Small incision was made.  Tunneler was then passed from the exit site to the neck counterincision dilators passed over the tunnel and the tunneler was connected to the catheter and pulled subcutaneously.  The hub assembly was then connected without difficulty.  Both lumens aspirated and flushed well and under fluoroscopy the catheter smooth contour with its tips at the atrial caval junction.  The neck counterincision was then closed with a 4-0 Monocryl subcuticular a pursestring suture was placed around the exit site with a 4-0 Monocryl and then the catheter was secured to the chest wall with 0 silk suture.  Biopatch and sterile dressing were applied to the exit site.  Dermabond applied to the neck incision.  Patient tolerated procedure well.  Plan: Patient now has  adequate dialysis access via the tunnel catheter.  The skin changes and massive size of the aneurysms will necessitate that they are resected however proximally we now have widely patent proximal cephalic with normal central venous anatomy.  Distally we have a widely patent arterial anastomosis that should allow for sufficient jump graft to be placed.  This plan was discussed with the patient in the office and we will move forward with it.  We will check the function of the catheter and approximately 2 weeks and move forward with surgical revision at that time.    COMPLICATIONS: None  CONDITION: Felicia Butler, M.D [facility] Office: [phone]  02/01/2022 10:39 AM

## 2022-02-01 NOTE — Interval H&P Note (Signed)
History and Physical Interval Note:  02/01/2022 8:07 AM  Felicia Butler  has presented today for surgery, with the diagnosis of L Arm Fistulagram and Perma Cath Insertion  ESR.  The various methods of treatment have been discussed with the patient and family. After consideration of risks, benefits and other options for treatment, the patient has consented to  Procedure(s): A/V Fistulagram (N/A) DIALYSIS/PERMA CATHETER INSERTION (N/A) as a surgical intervention.  The patient's history has been reviewed, patient examined, no change in status, stable for surgery.  I have reviewed the patient's chart and labs.  Questions were answered to the patient's satisfaction.     [name]

## 2022-02-24 ENCOUNTER — Ambulatory Visit (INDEPENDENT_AMBULATORY_CARE_PROVIDER_SITE_OTHER): Payer: Medicare Other | Admitting: Vascular Surgery

## 2022-03-07 ENCOUNTER — Ambulatory Visit (INDEPENDENT_AMBULATORY_CARE_PROVIDER_SITE_OTHER): Payer: Medicare Other | Admitting: Vascular Surgery

## 2022-03-07 ENCOUNTER — Encounter (INDEPENDENT_AMBULATORY_CARE_PROVIDER_SITE_OTHER): Payer: Self-pay | Admitting: Vascular Surgery

## 2022-03-07 VITALS — BP 157/76 | HR 78 | Resp 16 | Wt 145.0 lb

## 2022-03-07 DIAGNOSIS — E1122 Type 2 diabetes mellitus with diabetic chronic kidney disease: Secondary | ICD-10-CM

## 2022-03-07 DIAGNOSIS — I10 Essential (primary) hypertension: Secondary | ICD-10-CM | POA: Diagnosis not present

## 2022-03-07 DIAGNOSIS — N186 End stage renal disease: Secondary | ICD-10-CM | POA: Diagnosis not present

## 2022-03-07 DIAGNOSIS — I2510 Atherosclerotic heart disease of native coronary artery without angina pectoris: Secondary | ICD-10-CM

## 2022-03-07 DIAGNOSIS — N183 Chronic kidney disease, stage 3 unspecified: Secondary | ICD-10-CM

## 2022-03-07 DIAGNOSIS — E785 Hyperlipidemia, unspecified: Secondary | ICD-10-CM

## 2022-03-07 NOTE — Progress Notes (Signed)
MRN : [id number]  Felicia Butler is a 61 y.o. (03-22-1961) female who presents with chief complaint of check access.  History of Present Illness:    The patient is seen for evaluation of dialysis access.    The patient returns to the office for followup status post intervention of their dialysis access 02/01/2022.   Procedure: Having successfully treated the venous outflow in preparation for revision of her aneurysmal access I then turned to placing a tunneled catheter.   Current access is via a catheter which is functioning poorly the flow rates have been less than ideal.  There have not been multiple episodes of catheter infection.  The patient denies fever and chills while on dialysis.  No tenderness or drainage at the exit site.  No recent shortening of the patient's walking distance or new symptoms consistent with claudication.  No history of rest pain symptoms. No new ulcers or wounds of the lower extremities have occurred.  The patient denies amaurosis fugax or recent TIA symptoms. There are no recent neurological changes noted. There is no history of DVT, PE or superficial thrombophlebitis. No recent episodes of angina or shortness of breath documented.       Current Meds  Medication Sig   amLODipine (NORVASC) 5 MG tablet Take 5 mg by mouth daily.   atorvastatin (LIPITOR) 80 MG tablet Take 80 mg by mouth daily.   AURYXIA 1 GM 210 MG(Fe) tablet Take 630 mg by mouth 3 (three) times daily.   cinacalcet (SENSIPAR) 30 MG tablet Take 30 mg by mouth daily.   famotidine (PEPCID) 20 MG tablet Take 20 mg by mouth 2 (two) times daily.   hydrALAZINE (APRESOLINE) 50 MG tablet Take 50 mg by mouth 2 (two) times daily.   levothyroxine (SYNTHROID, LEVOTHROID) 75 MCG tablet Take 75 mcg by mouth daily before breakfast.   losartan (COZAAR) 25 MG tablet Take 25 mg by mouth daily.    Past Medical History:  Diagnosis Date   Chronic kidney disease    Hernia     Hyperlipidemia    Hypertension    Hypothyroidism    Thyroid disease     Past Surgical History:  Procedure Laterality Date   A/V FISTULAGRAM N/A 02/01/2022   Procedure: A/V Fistulagram;  Surgeon: Katha Cabal, MD;  Location: [facility];  Service: Cardiovascular;  Laterality: N/A;   AV FISTULA PLACEMENT Left 01/18/2017   Procedure: ARTERIOVENOUS (AV) FISTULA CREATION ( BRACHIAL CEPHALIC );  Surgeon: Katha Cabal, MD;  Location: [facility];  Service: Vascular;  Laterality: Left;   BREAST BIOPSY Right 04-30-13   intraductal papilloma   BREAST BIOPSY Right 04-30-13   cyst aspiration   BREAST BIOPSY Right 05-27-13   subareolar duct excision   BREAST BIOPSY Left 05-27-13   subareolar duct excision   BREAST BIOPSY Left 08/11/2016   path pending   DIALYSIS/PERMA CATHETER INSERTION N/A 02/01/2022   Procedure: DIALYSIS/PERMA CATHETER INSERTION;  Surgeon: Katha Cabal, MD;  Location: [facility];  Service: Cardiovascular;  Laterality: N/A;   HERNIA REPAIR  1960's   PARTIAL HYSTERECTOMY     PERIPHERAL VASCULAR THROMBECTOMY Left 04/18/2017   Procedure: PERIPHERAL VASCULAR THROMBECTOMY;  Surgeon: Katha Cabal, MD;  Location: [facility];  Service: Cardiovascular;  Laterality: Left;    Social History Social History   Tobacco Use  Smoking status: Former    Years: 5.00    Types: Cigarettes    Quit date: 05/31/1983    Years since quitting: 38.7   Smokeless tobacco: Never  Substance Use Topics   Alcohol use: No   Drug use: No    Family History Family History  Problem Relation Age of Onset   Cancer Mother    Diabetes Father    Stroke Father     No Known Allergies   REVIEW OF SYSTEMS (Negative unless checked)  Constitutional: '[]'$ Weight loss  '[]'$ Fever  '[]'$ Chills Cardiac: '[]'$ Chest pain   '[]'$ Chest pressure   '[]'$ Palpitations   '[]'$ Shortness of breath when laying flat   '[]'$ Shortness of breath with exertion. Vascular:  '[]'$ Pain in legs with walking    '[]'$ Pain in legs at rest  '[]'$ History of DVT   '[]'$ Phlebitis   '[]'$ Swelling in legs   '[]'$ Varicose veins   '[]'$ Non-healing ulcers Pulmonary:   '[]'$ Uses home oxygen   '[]'$ Productive cough   '[]'$ Hemoptysis   '[]'$ Wheeze  '[]'$ COPD   '[]'$ Asthma Neurologic:  '[]'$ Dizziness   '[]'$ Seizures   '[]'$ History of stroke   '[]'$ History of TIA  '[]'$ Aphasia   '[]'$ Vissual changes   '[]'$ Weakness or numbness in arm   '[]'$ Weakness or numbness in leg Musculoskeletal:   '[]'$ Joint swelling   '[]'$ Joint pain   '[]'$ Low back pain Hematologic:  '[]'$ Easy bruising  '[]'$ Easy bleeding   '[]'$ Hypercoagulable state   '[]'$ Anemic Gastrointestinal:  '[]'$ Diarrhea   '[]'$ Vomiting  '[]'$ Gastroesophageal reflux/heartburn   '[]'$ Difficulty swallowing. Genitourinary:  '[x]'$ Chronic kidney disease   '[]'$ Difficult urination  '[]'$ Frequent urination   '[]'$ Blood in urine Skin:  '[]'$ Rashes   '[]'$ Ulcers  Psychological:  '[]'$ History of anxiety   '[]'$  History of major depression.  Physical Examination  There were no vitals filed for this visit. There is no height or weight on file to calculate BMI. Gen: WD/WN, NAD Head: [place]/AT, No temporalis wasting.  Ear/Nose/Throat: Hearing grossly intact, nares w/o erythema or drainage Eyes: PER, EOMI, sclera nonicteric.  Neck: Supple, no gross masses or lesions.  No JVD.  Pulmonary:  Good air movement, no audible wheezing, no use of accessory muscles.  Cardiac: RRR, precordium non-hyperdynamic. Vascular:   AV access very aneurysmal with skin changes Vessel Right Left  Radial Palpable Palpable  Brachial Palpable Palpable  Gastrointestinal: soft, non-distended. No guarding/no peritoneal signs.  Musculoskeletal: M/S 5/5 throughout.  No deformity.  Neurologic: CN 2-12 intact. Pain and light touch intact in extremities.  Symmetrical.  Speech is fluent. Motor exam as listed above. Psychiatric: Judgment intact, Mood & affect appropriate for pt's clinical situation. Dermatologic: No rashes or ulcers noted.  No changes consistent with cellulitis.   CBC Lab Results  Component Value Date    WBC 10.0 06/21/2013   HGB 11.7 (L) 06/21/2013   HCT 36.3 06/21/2013   MCV 83 06/21/2013   PLT 338 06/21/2013    BMET    Component Value Date/Time   NA 140 01/10/2017 0828   NA 138 06/21/2013 0048   K 4.4 01/10/2017 0828   K 4.1 06/21/2013 0048   CL 113 (H) 01/10/2017 0828   CL 108 (H) 06/21/2013 0048   CO2 16 (L) 01/10/2017 0828   CO2 22 06/21/2013 0048   GLUCOSE 95 01/10/2017 0828   GLUCOSE 122 (H) 06/21/2013 0048   BUN 81 (H) 01/10/2017 0828   BUN 44 (H) 06/21/2013 0048   CREATININE 8.44 (H) 01/10/2017 0828   CREATININE 3.33 (H) 06/21/2013 0048   CALCIUM 10.0 01/10/2017 0828   CALCIUM 9.6 06/21/2013 0048  GFRNONAA 5 (L) 01/10/2017 0828   GFRNONAA 15 (L) 06/21/2013 0048   GFRAA 5 (L) 01/10/2017 0828   GFRAA 17 (L) 06/21/2013 0048   CrCl cannot be calculated (Patient's most recent lab result is older than the maximum 21 days allowed.).  COAG Lab Results  Component Value Date   INR 0.95 01/10/2017    Radiology No results found.   Assessment/Plan 1. End stage renal disease ([place]) Recommend:  At this time the patient does not have appropriate extremity access for dialysis  Patient should have a revision of her current access.  The risks, benefits and alternative therapies were reviewed in detail with the patient.  All questions were answered.  The patient agrees to proceed with surgery.   The patient will follow up with me in the office after the surgery.   2. Essential hypertension Continue antihypertensive medications as already ordered, these medications have been reviewed and there are no changes at this time.   3. CAD in native artery Continue cardiac and antihypertensive medications as already ordered and reviewed, no changes at this time.  Continue statin as ordered and reviewed, no changes at this time  Nitrates PRN for chest pain   4. Type 2 diabetes mellitus with stage 3 chronic kidney disease, unspecified whether long term insulin use,  unspecified whether stage 3a or 3b CKD ([place]) Continue hypoglycemic medications as already ordered, these medications have been reviewed and there are no changes at this time.  Hgb A1C to be monitored as already arranged by primary service   5. Hyperlipidemia, unspecified hyperlipidemia type Continue statin as ordered and reviewed, no changes at this time     [name], MD  03/07/2022 1:12 PM

## 2022-03-12 ENCOUNTER — Encounter (INDEPENDENT_AMBULATORY_CARE_PROVIDER_SITE_OTHER): Payer: Self-pay | Admitting: Vascular Surgery

## 2022-03-28 ENCOUNTER — Encounter (INDEPENDENT_AMBULATORY_CARE_PROVIDER_SITE_OTHER): Payer: Self-pay

## 2022-04-14 ENCOUNTER — Telehealth (INDEPENDENT_AMBULATORY_CARE_PROVIDER_SITE_OTHER): Payer: Self-pay

## 2022-04-14 NOTE — Telephone Encounter (Signed)
Spoke with the patient and she is scheduled with Dr. Delana Meyer on 05/06/22 for a revision of left brachial cephalic fistula at the [facility]. Pre-op phone call is on 04/28/22 between 8-1 pm and pre-surgical instructions were discussed and will be mailed.

## 2022-04-27 ENCOUNTER — Other Ambulatory Visit (INDEPENDENT_AMBULATORY_CARE_PROVIDER_SITE_OTHER): Payer: Self-pay | Admitting: Nurse Practitioner

## 2022-04-27 DIAGNOSIS — N186 End stage renal disease: Secondary | ICD-10-CM

## 2022-04-28 ENCOUNTER — Encounter
Admission: RE | Admit: 2022-04-28 | Discharge: 2022-04-28 | Disposition: A | Discharge status: Home / Self Care | Payer: Medicare Other | Source: Ambulatory Visit | Attending: Vascular Surgery | Admitting: Vascular Surgery

## 2022-04-28 VITALS — Ht 61.0 in | Wt 145.0 lb

## 2022-04-28 DIAGNOSIS — Z01812 Encounter for preprocedural laboratory examination: Secondary | ICD-10-CM

## 2022-04-28 DIAGNOSIS — N186 End stage renal disease: Secondary | ICD-10-CM

## 2022-04-28 HISTORY — DX: Anemia, unspecified: D64.9

## 2022-04-28 HISTORY — DX: Dependence on renal dialysis: Z99.2

## 2022-04-28 HISTORY — DX: Latent tuberculosis: Z22.7

## 2022-04-28 HISTORY — DX: Pulmonary hypertension, unspecified: I27.20

## 2022-04-28 HISTORY — DX: Gout, unspecified: M10.9

## 2022-04-28 HISTORY — DX: Gastro-esophageal reflux disease without esophagitis: K21.9

## 2022-04-28 NOTE — Patient Instructions (Addendum)
Your procedure is scheduled on: Friday, December 8 Report to the Registration Desk on the 1st floor of the [facility]. To find out your arrival time, please call [phone] between 1PM - 3PM on: Thursday, December 7 If your arrival time is 6:00 am, do not arrive prior to that time as the [place] entrance doors do not open until 6:00 am.  REMEMBER: Instructions that are not followed completely may result in serious medical risk, up to and including death; or upon the discretion of your surgeon and anesthesiologist your surgery may need to be rescheduled.  Do not eat or drink after midnight the night before surgery.  No gum chewing, lozengers or hard candies.  TAKE THESE MEDICATIONS THE MORNING OF SURGERY WITH A SIP OF WATER:  Amlodipine Famotidine (Pepcid) Hydralazine Levothyroxine  One week prior to surgery: starting December 1 Stop Anti-inflammatories (NSAIDS) such as Advil, Aleve, Ibuprofen, Motrin, Naproxen, Naprosyn and Aspirin based products such as Excedrin, Goodys Powder, BC Powder. Stop ANY OVER THE COUNTER supplements until after surgery. You may however, continue to take Tylenol if needed for pain up until the day of surgery.  No Alcohol for 24 hours before or after surgery.  No Smoking including e-cigarettes for 24 hours prior to surgery.  No chewable tobacco products for at least 6 hours prior to surgery.  No nicotine patches on the day of surgery.  Do not use any "recreational" drugs for at least a week prior to your surgery.  Please be advised that the combination of cocaine and anesthesia may have negative outcomes, up to and including death. If you test positive for cocaine, your surgery will be cancelled.  On the morning of surgery brush your teeth with toothpaste and water, you may rinse your mouth with mouthwash if you wish. Do not swallow any toothpaste or mouthwash.  Use CHG wipes as directed on instruction sheet.  Do not wear jewelry, make-up,  hairpins, clips or nail polish.  Do not wear lotions, powders, or perfumes.   Do not shave body from the neck down 48 hours prior to surgery just in case you cut yourself which could leave a site for infection.  Also, freshly shaved skin may become irritated if using the CHG soap.  Contact lenses, hearing aids and dentures may not be worn into surgery.  Do not bring valuables to the hospital. [facility] is not responsible for any missing/lost belongings or valuables.   Notify your doctor if there is any change in your medical condition (cold, fever, infection).  Wear comfortable clothing (specific to your surgery type) to the hospital.  After surgery, you can help prevent lung complications by doing breathing exercises.  Take deep breaths and cough every 1-2 hours. Your doctor may order a device called an Incentive Spirometer to help you take deep breaths.  If you are being discharged the day of surgery, you will not be allowed to drive home. You will need a responsible adult (18 years or older) to drive you home and stay with you that night.   If you are taking public transportation, you will need to have a responsible adult (18 years or older) with you. Please confirm with your physician that it is acceptable to use public transportation.   Please call the [place] Dept. at [phone] if you have any questions about these instructions.  Surgery Visitation Policy:  Patients undergoing a surgery or procedure may have two family members or support persons with them as long  as the person is not COVID-19 positive or experiencing its symptoms.   Preparing the Skin Before Surgery     To help prevent the risk of infection at your surgical site, we are now providing you with rinse-free Sage 2% Chlorhexidine Gluconate (CHG) disposable wipes.  Chlorhexidine Gluconate (CHG) Soap  o An antiseptic cleaner that kills germs and bonds with the skin to continue killing germs  even after washing  o Used for showering the night before surgery and morning of surgery  The night before surgery: Shower or bathe with warm water. Do not apply perfume, lotions, powders. Wait one hour after shower. Skin should be dry and cool. Open Sage wipe package - 6 disposable cloths are inside. Wipe body using one cloth for the right arm, one cloth for the left arm, one cloth for the right leg, one cloth for the left leg, one cloth for the chest/abdomen area (do not use on breasts if breast feeding), and one cloth for the back. 5. Do not rinse, allow to dry. 6. Skin may fee "tacky" for several minutes. 7. Dress in clean clothes. 8. Place clean sheets on your bed and do not sleep with pets.  REPEAT ABOVE ON THE MORNING OF SURGERY PRIOR TO ARRIVING TO [place].

## 2022-05-03 ENCOUNTER — Encounter
Admission: RE | Admit: 2022-05-03 | Discharge: 2022-05-03 | Disposition: A | Discharge status: Home / Self Care | Payer: Medicare Other | Source: Ambulatory Visit | Attending: Vascular Surgery | Admitting: Vascular Surgery

## 2022-05-03 ENCOUNTER — Encounter: Payer: Self-pay | Admitting: Urgent Care

## 2022-05-03 DIAGNOSIS — Z01818 Encounter for other preprocedural examination: Secondary | ICD-10-CM | POA: Diagnosis present

## 2022-05-03 DIAGNOSIS — Z01810 Encounter for preprocedural cardiovascular examination: Secondary | ICD-10-CM | POA: Diagnosis not present

## 2022-05-03 DIAGNOSIS — N186 End stage renal disease: Secondary | ICD-10-CM | POA: Insufficient documentation

## 2022-05-03 DIAGNOSIS — R9431 Abnormal electrocardiogram [ECG] [EKG]: Secondary | ICD-10-CM

## 2022-05-03 HISTORY — DX: Abnormal electrocardiogram (ECG) (EKG): R94.31

## 2022-05-03 LAB — BASIC METABOLIC PANEL
Anion gap: 12 (ref 5–15)
BUN: 33 mg/dL — ABNORMAL HIGH (ref 8–23)
CO2: 32 mmol/L (ref 22–32)
Calcium: 9 mg/dL (ref 8.9–10.3)
Chloride: 96 mmol/L — ABNORMAL LOW (ref 98–111)
Creatinine, Ser: 6.2 mg/dL — ABNORMAL HIGH (ref 0.44–1.00)
GFR, Estimated: 7 mL/min — ABNORMAL LOW (ref >60)
Glucose, Bld: 86 mg/dL (ref 70–99)
Potassium: 3.6 mmol/L (ref 3.5–5.1)
Sodium: 140 mmol/L (ref 135–145)

## 2022-05-03 LAB — TYPE AND SCREEN
ABO/RH(D): AB POS
Antibody Screen: NEGATIVE

## 2022-05-03 LAB — CBC WITH DIFFERENTIAL/PLATELET
Abs Immature Granulocytes: 0.02 10*3/uL (ref 0.00–0.07)
Basophils Absolute: 0 10*3/uL (ref 0.0–0.1)
Basophils Relative: 1 %
Eosinophils Absolute: 0.1 10*3/uL (ref 0.0–0.5)
Eosinophils Relative: 2 %
HCT: 34 % — ABNORMAL LOW (ref 36.0–46.0)
Hemoglobin: 11.1 g/dL — ABNORMAL LOW (ref 12.0–15.0)
Immature Granulocytes: 0 %
Lymphocytes Relative: 28 %
Lymphs Abs: 1.3 10*3/uL (ref 0.7–4.0)
MCH: 27.6 pg (ref 26.0–34.0)
MCHC: 32.6 g/dL (ref 30.0–36.0)
MCV: 84.6 fL (ref 80.0–100.0)
Monocytes Absolute: 0.6 10*3/uL (ref 0.1–1.0)
Monocytes Relative: 12 %
Neutro Abs: 2.7 10*3/uL (ref 1.7–7.7)
Neutrophils Relative %: 57 %
Platelets: 102 10*3/uL — ABNORMAL LOW (ref 150–400)
RBC: 4.02 MIL/uL (ref 3.87–5.11)
RDW: 18.5 % — ABNORMAL HIGH (ref 11.5–15.5)
WBC: 4.7 10*3/uL (ref 4.0–10.5)
nRBC: 0 % (ref 0.0–0.2)

## 2022-05-05 MED ORDER — CHLORHEXIDINE GLUCONATE CLOTH 2 % EX PADS
6.0000 | MEDICATED_PAD | Freq: Once | CUTANEOUS | Status: AC
  Administered 2022-05-06: 6 via TOPICAL

## 2022-05-05 MED ORDER — ORAL CARE MOUTH RINSE: 15.0000 mL | Freq: Once | OROMUCOSAL | Status: AC

## 2022-05-05 MED ORDER — CEFAZOLIN SODIUM-DEXTROSE 2-4 GM/100ML-% IV SOLN
2.0000 g | INTRAVENOUS | Status: AC
  Administered 2022-05-06: 2 g via INTRAVENOUS

## 2022-05-05 MED ORDER — SODIUM CHLORIDE 0.9 % IV SOLN: INTRAVENOUS | Status: DC

## 2022-05-05 MED ORDER — CHLORHEXIDINE GLUCONATE 0.12 % MT SOLN: 15.0000 mL | Freq: Once | OROMUCOSAL | Status: AC

## 2022-05-06 ENCOUNTER — Ambulatory Visit: Payer: Medicare Other | Admitting: Anesthesiology

## 2022-05-06 ENCOUNTER — Ambulatory Visit: Payer: Medicare Other

## 2022-05-06 ENCOUNTER — Other Ambulatory Visit: Payer: Self-pay

## 2022-05-06 ENCOUNTER — Encounter
Admission: RE | Disposition: A | Discharge status: Home / Self Care | Payer: Self-pay | Source: Home / Self Care | Attending: Vascular Surgery

## 2022-05-06 ENCOUNTER — Encounter: Payer: Self-pay | Admitting: Vascular Surgery

## 2022-05-06 ENCOUNTER — Ambulatory Visit
Admission: RE | Admit: 2022-05-06 | Discharge: 2022-05-06 | Disposition: A | Discharge status: Home / Self Care | Payer: Medicare Other | Attending: Vascular Surgery | Admitting: Vascular Surgery

## 2022-05-06 DIAGNOSIS — Y832 Surgical operation with anastomosis, bypass or graft as the cause of abnormal reaction of the patient, or of later complication, without mention of misadventure at the time of the procedure: Secondary | ICD-10-CM | POA: Diagnosis not present

## 2022-05-06 DIAGNOSIS — I2510 Atherosclerotic heart disease of native coronary artery without angina pectoris: Secondary | ICD-10-CM | POA: Insufficient documentation

## 2022-05-06 DIAGNOSIS — E1122 Type 2 diabetes mellitus with diabetic chronic kidney disease: Secondary | ICD-10-CM | POA: Diagnosis not present

## 2022-05-06 DIAGNOSIS — Z992 Dependence on renal dialysis: Secondary | ICD-10-CM | POA: Diagnosis not present

## 2022-05-06 DIAGNOSIS — E785 Hyperlipidemia, unspecified: Secondary | ICD-10-CM | POA: Insufficient documentation

## 2022-05-06 DIAGNOSIS — T82511A Breakdown (mechanical) of surgically created arteriovenous shunt, initial encounter: Secondary | ICD-10-CM | POA: Diagnosis not present

## 2022-05-06 DIAGNOSIS — T82898A Other specified complication of vascular prosthetic devices, implants and grafts, initial encounter: Secondary | ICD-10-CM

## 2022-05-06 DIAGNOSIS — I120 Hypertensive chronic kidney disease with stage 5 chronic kidney disease or end stage renal disease: Secondary | ICD-10-CM | POA: Diagnosis not present

## 2022-05-06 DIAGNOSIS — T82510A Breakdown (mechanical) of surgically created arteriovenous fistula, initial encounter: Secondary | ICD-10-CM | POA: Diagnosis present

## 2022-05-06 DIAGNOSIS — N186 End stage renal disease: Secondary | ICD-10-CM | POA: Insufficient documentation

## 2022-05-06 DIAGNOSIS — Z01812 Encounter for preprocedural laboratory examination: Secondary | ICD-10-CM

## 2022-05-06 HISTORY — PX: REVISON OF ARTERIOVENOUS FISTULA: SHX6074

## 2022-05-06 LAB — POCT I-STAT, CHEM 8
BUN: 41 mg/dL — ABNORMAL HIGH (ref 8–23)
Calcium, Ion: 0.97 mmol/L — ABNORMAL LOW (ref 1.15–1.40)
Chloride: 101 mmol/L (ref 98–111)
Creatinine, Ser: 10.1 mg/dL — ABNORMAL HIGH (ref 0.44–1.00)
Glucose, Bld: 94 mg/dL (ref 70–99)
HCT: 37 % (ref 36.0–46.0)
Hemoglobin: 12.6 g/dL (ref 12.0–15.0)
Potassium: 3.1 mmol/L — ABNORMAL LOW (ref 3.5–5.1)
Sodium: 138 mmol/L (ref 135–145)
TCO2: 25 mmol/L (ref 22–32)

## 2022-05-06 SURGERY — REVISON OF ARTERIOVENOUS FISTULA
Anesthesia: Monitor Anesthesia Care | Laterality: Left

## 2022-05-06 MED ORDER — KETAMINE HCL 10 MG/ML IJ SOLN
INTRAMUSCULAR | Status: DC | PRN
  Administered 2022-05-06: 10 mg via INTRAVENOUS
  Administered 2022-05-06: 20 mg via INTRAVENOUS
  Administered 2022-05-06: 10 mg via INTRAVENOUS

## 2022-05-06 MED ORDER — OXYCODONE HCL 5 MG/5ML PO SOLN: 5.0000 mg | Freq: Once | ORAL | Status: DC | PRN

## 2022-05-06 MED ORDER — FENTANYL CITRATE (PF) 100 MCG/2ML IJ SOLN
INTRAMUSCULAR | Status: DC | PRN
  Administered 2022-05-06: 25 ug via INTRAVENOUS
  Administered 2022-05-06: 50 ug via INTRAVENOUS
  Administered 2022-05-06: 25 ug via INTRAVENOUS
  Administered 2022-05-06 (×2): 50 ug via INTRAVENOUS

## 2022-05-06 MED ORDER — MIDAZOLAM HCL 2 MG/2ML IJ SOLN
INTRAMUSCULAR | Status: AC
  Administered 2022-05-06: 2 mg via INTRAVENOUS
  Filled 2022-05-06: qty 2

## 2022-05-06 MED ORDER — HEPARIN 5000 UNITS IN NS 1000 ML (FLUSH)
INTRAMUSCULAR | Status: DC | PRN
  Administered 2022-05-06: 10000 mL via INTRAMUSCULAR

## 2022-05-06 MED ORDER — PROPOFOL 500 MG/50ML IV EMUL
INTRAVENOUS | Status: DC | PRN
  Administered 2022-05-06: 100 ug/kg/min via INTRAVENOUS

## 2022-05-06 MED ORDER — FENTANYL CITRATE (PF) 100 MCG/2ML IJ SOLN
INTRAMUSCULAR | Status: AC
  Filled 2022-05-06: qty 2

## 2022-05-06 MED ORDER — DROPERIDOL 2.5 MG/ML IJ SOLN: 0.6250 mg | Freq: Once | INTRAMUSCULAR | Status: DC | PRN

## 2022-05-06 MED ORDER — BUPIVACAINE LIPOSOME 1.3 % IJ SUSP
INTRAMUSCULAR | Status: AC
  Filled 2022-05-06: qty 20

## 2022-05-06 MED ORDER — BUPIVACAINE HCL (PF) 0.5 % IJ SOLN
INTRAMUSCULAR | Status: DC | PRN
  Administered 2022-05-06: 10 mL

## 2022-05-06 MED ORDER — HEPARIN SODIUM (PORCINE) 5000 UNIT/ML IJ SOLN
INTRAMUSCULAR | Status: AC
  Filled 2022-05-06: qty 1

## 2022-05-06 MED ORDER — SODIUM CHLORIDE FLUSH 0.9 % IV SOLN
INTRAVENOUS | Status: AC
  Filled 2022-05-06: qty 40

## 2022-05-06 MED ORDER — PROPOFOL 10 MG/ML IV BOLUS
INTRAVENOUS | Status: DC | PRN
  Administered 2022-05-06: 10 mg via INTRAVENOUS
  Administered 2022-05-06: 30 mg via INTRAVENOUS
  Administered 2022-05-06: 80 mg via INTRAVENOUS

## 2022-05-06 MED ORDER — PHENYLEPHRINE 80 MCG/ML (10ML) SYRINGE FOR IV PUSH (FOR BLOOD PRESSURE SUPPORT)
PREFILLED_SYRINGE | INTRAVENOUS | Status: AC
  Filled 2022-05-06: qty 10

## 2022-05-06 MED ORDER — PROMETHAZINE HCL 25 MG/ML IJ SOLN: 6.2500 mg | INTRAMUSCULAR | Status: DC | PRN

## 2022-05-06 MED ORDER — CEFAZOLIN SODIUM-DEXTROSE 2-4 GM/100ML-% IV SOLN
INTRAVENOUS | Status: AC
  Filled 2022-05-06: qty 100

## 2022-05-06 MED ORDER — ALBUMIN HUMAN 5 % IV SOLN
INTRAVENOUS | Status: AC
  Filled 2022-05-06: qty 250

## 2022-05-06 MED ORDER — CHLORHEXIDINE GLUCONATE 0.12 % MT SOLN
OROMUCOSAL | Status: AC
  Administered 2022-05-06: 15 mL via OROMUCOSAL
  Filled 2022-05-06: qty 15

## 2022-05-06 MED ORDER — MICROFIBRILLAR COLL HEMOSTAT EX PADS
MEDICATED_PAD | CUTANEOUS | Status: DC | PRN
  Administered 2022-05-06: 1 via TOPICAL

## 2022-05-06 MED ORDER — KETAMINE HCL 50 MG/5ML IJ SOSY
PREFILLED_SYRINGE | INTRAMUSCULAR | Status: AC
  Filled 2022-05-06: qty 5

## 2022-05-06 MED ORDER — OXYCODONE HCL 5 MG PO TABS: 5.0000 mg | ORAL_TABLET | Freq: Once | ORAL | Status: DC | PRN

## 2022-05-06 MED ORDER — LIDOCAINE HCL (PF) 1 % IJ SOLN
INTRAMUSCULAR | Status: AC
  Filled 2022-05-06: qty 5

## 2022-05-06 MED ORDER — ACETAMINOPHEN 10 MG/ML IV SOLN
INTRAVENOUS | Status: DC | PRN
  Administered 2022-05-06: 1000 mg via INTRAVENOUS

## 2022-05-06 MED ORDER — ACETAMINOPHEN 10 MG/ML IV SOLN
INTRAVENOUS | Status: AC
  Filled 2022-05-06: qty 100

## 2022-05-06 MED ORDER — ONDANSETRON HCL 4 MG/2ML IJ SOLN
INTRAMUSCULAR | Status: DC | PRN
  Administered 2022-05-06: 4 mg via INTRAVENOUS

## 2022-05-06 MED ORDER — BUPIVACAINE HCL (PF) 0.5 % IJ SOLN
INTRAMUSCULAR | Status: DC | PRN
  Administered 2022-05-06: 15 mL via PERINEURAL

## 2022-05-06 MED ORDER — MIDAZOLAM HCL 2 MG/2ML IJ SOLN: 2.0000 mg | Freq: Once | INTRAMUSCULAR | Status: AC

## 2022-05-06 MED ORDER — ACETAMINOPHEN 10 MG/ML IV SOLN: 1000.0000 mg | Freq: Once | INTRAVENOUS | Status: DC | PRN

## 2022-05-06 MED ORDER — SODIUM CHLORIDE 0.9 % IV SOLN
INTRAVENOUS | Status: DC | PRN
  Administered 2022-05-06: 50 mL

## 2022-05-06 MED ORDER — OXYCODONE-ACETAMINOPHEN 5-325 MG PO TABS: 1.0000 | ORAL_TABLET | ORAL | 0 refills | Status: DC | PRN

## 2022-05-06 MED ORDER — BUPIVACAINE HCL (PF) 0.5 % IJ SOLN
INTRAMUSCULAR | Status: AC
  Filled 2022-05-06: qty 10

## 2022-05-06 MED ORDER — PHENYLEPHRINE 80 MCG/ML (10ML) SYRINGE FOR IV PUSH (FOR BLOOD PRESSURE SUPPORT)
PREFILLED_SYRINGE | INTRAVENOUS | Status: DC | PRN
  Administered 2022-05-06: 40 ug via INTRAVENOUS
  Administered 2022-05-06: 160 ug via INTRAVENOUS
  Administered 2022-05-06: 80 ug via INTRAVENOUS

## 2022-05-06 MED ORDER — BUPIVACAINE HCL (PF) 0.5 % IJ SOLN
INTRAMUSCULAR | Status: AC
  Filled 2022-05-06: qty 20

## 2022-05-06 MED ORDER — BUPIVACAINE HCL (PF) 0.25 % IJ SOLN
INTRAMUSCULAR | Status: AC
  Filled 2022-05-06: qty 30

## 2022-05-06 MED ORDER — DEXAMETHASONE SODIUM PHOSPHATE 10 MG/ML IJ SOLN
INTRAMUSCULAR | Status: DC | PRN
  Administered 2022-05-06: 10 mg via INTRAVENOUS

## 2022-05-06 MED ORDER — LIDOCAINE HCL (PF) 1 % IJ SOLN
INTRAMUSCULAR | Status: DC | PRN
  Administered 2022-05-06: 1 mL via SUBCUTANEOUS
  Administered 2022-05-06: 50 mL via SUBCUTANEOUS

## 2022-05-06 MED ORDER — ALBUMIN HUMAN 5 % IV SOLN: INTRAVENOUS | Status: DC | PRN

## 2022-05-06 MED ORDER — PAPAVERINE HCL 30 MG/ML IJ SOLN
INTRAMUSCULAR | Status: AC
  Filled 2022-05-06: qty 2

## 2022-05-06 MED ORDER — PHENYLEPHRINE HCL (PRESSORS) 10 MG/ML IV SOLN
INTRAVENOUS | Status: AC
  Filled 2022-05-06: qty 1

## 2022-05-06 MED ORDER — EPHEDRINE SULFATE (PRESSORS) 50 MG/ML IJ SOLN
INTRAMUSCULAR | Status: DC | PRN
  Administered 2022-05-06: 10 mg via INTRAVENOUS
  Administered 2022-05-06: 5 mg via INTRAVENOUS
  Administered 2022-05-06 (×4): 10 mg via INTRAVENOUS

## 2022-05-06 MED ORDER — EPHEDRINE 5 MG/ML INJ
INTRAVENOUS | Status: AC
  Filled 2022-05-06: qty 5

## 2022-05-06 MED ORDER — FENTANYL CITRATE (PF) 100 MCG/2ML IJ SOLN: 25.0000 ug | INTRAMUSCULAR | Status: DC | PRN

## 2022-05-06 SURGICAL SUPPLY — 74 items
ADH SKN CLS APL DERMABOND .7 (GAUZE/BANDAGES/DRESSINGS) ×2
APL PRP STRL LF DISP 70% ISPRP (MISCELLANEOUS) ×1
APPLIER CLIP 11 MED OPEN (CLIP)
APPLIER CLIP 9.375 SM OPEN (CLIP)
APR CLP MED 11 20 MLT OPN (CLIP)
APR CLP SM 9.3 20 MLT OPN (CLIP)
BAG DECANTER FOR FLEXI CONT (MISCELLANEOUS) ×1 IMPLANT
BLADE SURG 15 STRL LF DISP TIS (BLADE) ×1 IMPLANT
BLADE SURG 15 STRL SS (BLADE) ×1
BLADE SURG SZ11 CARB STEEL (BLADE) ×1 IMPLANT
BOOT SUTURE AID YELLOW STND (SUTURE) ×1 IMPLANT
BRUSH SCRUB EZ  4% CHG (MISCELLANEOUS) ×1
BRUSH SCRUB EZ 4% CHG (MISCELLANEOUS) ×1 IMPLANT
CHLORAPREP W/TINT 26 (MISCELLANEOUS) ×1 IMPLANT
CLIP APPLIE 11 MED OPEN (CLIP) IMPLANT
CLIP APPLIE 9.375 SM OPEN (CLIP) IMPLANT
DERMABOND ADVANCED .7 DNX12 (GAUZE/BANDAGES/DRESSINGS) ×1 IMPLANT
DRESSING SURGICEL FIBRLLR 1X2 (HEMOSTASIS) ×1 IMPLANT
DRSG OPSITE POSTOP 4X14 (GAUZE/BANDAGES/DRESSINGS) IMPLANT
DRSG OPSITE POSTOP 4X6 (GAUZE/BANDAGES/DRESSINGS) IMPLANT
DRSG SURGICEL FIBRILLAR 1X2 (HEMOSTASIS) ×1
ELECT CAUTERY BLADE 6.4 (BLADE) ×1 IMPLANT
ELECT REM PT RETURN 9FT ADLT (ELECTROSURGICAL) ×1
ELECTRODE REM PT RTRN 9FT ADLT (ELECTROSURGICAL) ×1 IMPLANT
GEL ULTRASOUND 20GR AQUASONIC (MISCELLANEOUS) ×1 IMPLANT
GLOVE BIO SURGEON STRL SZ7 (GLOVE) ×1 IMPLANT
GLOVE SURG SYN 7.0 (GLOVE) ×1 IMPLANT
GLOVE SURG SYN 7.0 PF PI (GLOVE) ×1 IMPLANT
GLOVE SURG SYN 8.0 (GLOVE) ×1 IMPLANT
GLOVE SURG SYN 8.0 PF PI (GLOVE) ×1 IMPLANT
GOWN STRL REUS W/ TWL LRG LVL3 (GOWN DISPOSABLE) ×3 IMPLANT
GOWN STRL REUS W/ TWL XL LVL3 (GOWN DISPOSABLE) ×1 IMPLANT
GOWN STRL REUS W/TWL LRG LVL3 (GOWN DISPOSABLE) ×3
GOWN STRL REUS W/TWL XL LVL3 (GOWN DISPOSABLE) ×1
GRAFT COLLAGEN VASCULAR 8X45 (Miscellaneous) IMPLANT
IV NS 100ML SINGLE PACK (IV SOLUTION) IMPLANT
IV NS 500ML (IV SOLUTION) ×2
IV NS 500ML BAXH (IV SOLUTION) ×1 IMPLANT
KIT TURNOVER KIT A (KITS) ×1 IMPLANT
LABEL OR SOLS (LABEL) ×1 IMPLANT
LOOP RED MAXI  1X406MM (MISCELLANEOUS) ×1
LOOP VESSEL MAXI 1X406 RED (MISCELLANEOUS) ×1 IMPLANT
LOOP VESSEL MINI 0.8X406 BLUE (MISCELLANEOUS) ×2 IMPLANT
LOOPS BLUE MINI 0.8X406MM (MISCELLANEOUS) ×2
MANIFOLD NEPTUNE II (INSTRUMENTS) ×1 IMPLANT
NDL FILTER BLUNT 18X1 1/2 (NEEDLE) ×1 IMPLANT
NEEDLE FILTER BLUNT 18X1 1/2 (NEEDLE) ×1 IMPLANT
NS IRRIG 500ML POUR BTL (IV SOLUTION) ×1 IMPLANT
PACK EXTREMITY ARMC (MISCELLANEOUS) ×1 IMPLANT
PAD PREP 24X41 OB/GYN DISP (PERSONAL CARE ITEMS) ×1 IMPLANT
SPIKE FLUID TRANSFER (MISCELLANEOUS) ×1 IMPLANT
STOCKINETTE 48X4 2 PLY STRL (GAUZE/BANDAGES/DRESSINGS) ×1 IMPLANT
STOCKINETTE STRL 4IN 9604848 (GAUZE/BANDAGES/DRESSINGS) ×1 IMPLANT
SUT ETHIBOND 0 (SUTURE) IMPLANT
SUT GORETEX 6.0 TT9 (SUTURE) IMPLANT
SUT MNCRL+ 5-0 UNDYED PC-3 (SUTURE) ×1 IMPLANT
SUT MONOCRYL 5-0 (SUTURE) ×2
SUT PROLENE 5 0 RB 1 DA (SUTURE) IMPLANT
SUT PROLENE 6 0 BV (SUTURE) ×4 IMPLANT
SUT SILK 2 0 (SUTURE) ×1
SUT SILK 2-0 18XBRD TIE 12 (SUTURE) ×1 IMPLANT
SUT SILK 3 0 (SUTURE) ×1
SUT SILK 3-0 18XBRD TIE 12 (SUTURE) ×1 IMPLANT
SUT SILK 4 0 (SUTURE) ×1
SUT SILK 4-0 18XBRD TIE 12 (SUTURE) ×1 IMPLANT
SUT VIC AB 3-0 SH 27 (SUTURE) ×11
SUT VIC AB 3-0 SH 27X BRD (SUTURE) ×2 IMPLANT
SYR 20ML LL LF (SYRINGE) ×1 IMPLANT
SYR 30ML LL (SYRINGE) IMPLANT
SYR 3ML LL SCALE MARK (SYRINGE) ×1 IMPLANT
SYR TOOMEY IRRIG 70ML (MISCELLANEOUS) ×1
SYRINGE TOOMEY IRRIG 70ML (MISCELLANEOUS) IMPLANT
TRAP FLUID SMOKE EVACUATOR (MISCELLANEOUS) ×1 IMPLANT
WATER STERILE IRR 500ML POUR (IV SOLUTION) ×1 IMPLANT

## 2022-05-06 NOTE — Op Note (Signed)
OPERATIVE NOTE    PROCEDURE: 1.  Revision left brachial cephalic fistula to a brachial axillary Artegraft 2.  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  The right internal jugular vein appears to be occluded at the confluence with the subclavian and innominate veins.  Left internal jugular vein and central veins on the left appear widely patent  SPECIMEN(S): The resected aneurysmal fistula en bloc to pathology  INDICATIONS:   Keani Gotcher is a 61 y.o. female who presents with end stage renal disease.  The patient is scheduled for left fistula resection with creation of a AV graft for continued access.  The patient is aware the risks include but are not limited to: bleeding, infection, steal syndrome, nerve damage, ischemic monomelic neuropathy, failure to mature, and need for additional procedures.  The patient is aware of the risks of the procedure and elects to proceed forward.    DESCRIPTION: After full informed written consent was obtained from the patient, the patient was brought back to the operating room and placed supine upon the operating table.  Prior to induction, the patient received IV antibiotics.   After obtaining adequate anesthesia, the patient was then prepped and draped in the standard fashion for a left arm access procedure.   A first assistant was required to provide a safe and appropriate environment for executing the surgery.  The assistant was integral in providing retraction, exposure, running suture providing suction and in the closing process.    A linear incision was then created along the medial  border of the aneurysmal portion of the brachiocephalic fistula.  The dissection was carried down through the skin and the soft fatty tissues to expose the aneurysmal vein itself.  Dissection was carried from the level of the brachial artery up to the proximal cephalic vein at the level of the mid deltoid muscle.  Once the medial side of the aneurysmal vein is dissected circumferentially and hemostasis achieved with Bovie cautery a complementary incision was made along the lateral aspect creating an elliptical segment of skin associated with the aneurysm.  Again a lateral incision was carried down to expose the aneurysm itself through the skin and soft tissues bleeding was controlled with Bovie cautery.  Several venous tributaries were encountered and these were ligated with 201 3-0 silk ties and divided.  The aneurysmal segment was then dissected circumferentially in its entirety.   The arterial anastomosis was then dissected out circumferentially so that a profunda clamp could be passed across the anastomotic area for proximal control.  This segment of the cephalic vein was also marked with surgical marker for orientation.  Likewise at the more proximal deltoid level the vein was dissected circumferentially where it was normal in contour and non-aneurysmal, the vein was then marked with a surgical marker in that surgical with a vessel loop.   The aneurysm was then ligated with 2-0 silk proximally distally profunda clamp was placed across the arterial anastomotic area and an angled Fogarty clamp across the proximal vein.  The aneurysm was then transected proximally distally and passed off the field en bloc. The wound was then inspected for hemostasis quarter percent Marcaine with epinephrine was infused into the skin  edges and soft tissues.  Once the wound was completely dry the subcutaneous tissues are reapproximated with running 3-0 Vicryl.     The Gore tunneler was then delivered onto the field and a  subcutaneous tunnel was made from the arterial incision to the venous incision. A 7 millimeter Artegraft was then pulled through the subcutaneous tunnel.  The Artegraft was marked with a surgical marker medication prior to pulling it through the newly created lateral tunnel.  The anastomotic area and the Artegraft were then approximated to each other this was a good match end-to-end anastomosis was fashioned with running 5-0 Prolene.  Flushing maneuvers were performed the graft was flushed and then it was irrigated and clamped just above the anastomosis.  The proximal vein and the distal end of the Artegraft was then approximated this was also a good match and an end-to-end graft to vein anastomosis was fashioned using running 5-0 Prolene.  Flushing maneuvers were performed.  Flow was then established through the AV graft.  At this point the graft was largely pulsatile throughout its course.  I then exposed the Artegraft in its entirety from the arterial anastomosis to the venous anastomosis.  At this point approximately 5 bands of subcutaneous tissue were identified that were constricting the Artegraft extrinsically almost like a ring.  Each had to be individually lysed.  Once this was achieved excellent thrill was noted in the graft.   At this point, I irrigated out the surgical wounds.  There was no further active bleeding.  The subcutaneous tissue was reapproximated with a running stitch of 3-0 Vicryl.  The skin was reapproximated with staples the skin was then cleaned, dried, and honeycomb dressing was applied.The patient tolerated this procedure well.    COMPLICATIONS: None   CONDITION: [facility]  Office: [phone]   05/06/2022, 2:34 PM

## 2022-05-06 NOTE — Transfer of Care (Signed)
Immediate Anesthesia Transfer of Care Note  Patient: Felicia Butler  Procedure(s) Performed: REVISON OF ARTERIOVENOUS FISTULA (BRACHIAL CEPHALIC) (Left)  Patient Location: PACU  Anesthesia Type:General  Level of Consciousness: awake  Airway & Oxygen Therapy: Patient Spontanous Breathing  Post-op Assessment: Report given to RN and Post -op Vital signs reviewed and stable  Post vital signs: Reviewed and stable  Last Vitals:  Vitals Value Taken Time  BP 146/76 05/06/22 1445  Temp    Pulse 73 05/06/22 1447  Resp 24 05/06/22 1447  SpO2 94 % 05/06/22 1447  Vitals shown include unvalidated device data.  Last Pain:  Vitals:   05/06/22 0832  TempSrc: Temporal  PainSc: 0-No pain         Complications: No notable events documented.

## 2022-05-06 NOTE — Anesthesia Preprocedure Evaluation (Addendum)
Anesthesia Evaluation  Patient identified by MRN, date of birth, ID band Patient awake    Reviewed: Allergy & Precautions, NPO status , Patient's Chart, lab work & pertinent test results  Airway Mallampati: III  TM Distance: >3 FB Neck ROM: full    Dental  (+) Chipped   Pulmonary former smoker TB lung, latent   Pulmonary exam normal        Cardiovascular Exercise Tolerance: Good hypertension, Normal cardiovascular exam     Neuro/Psych negative neurological ROS  negative psych ROS   GI/Hepatic Neg liver ROS,GERD  Controlled,,  Endo/Other  diabetes, Type 2Hypothyroidism    Renal/GU ESRF and DialysisRenal disease     Musculoskeletal   Abdominal   Peds  Hematology  (+) Blood dyscrasia, anemia   Anesthesia Other Findings Past Medical History: No date: Anemia No date: Chronic kidney disease No date: Dialysis patient ([place])     Comment:  M-W-F No date: GERD (gastroesophageal reflux disease) No date: Gout No date: Hernia No date: Hyperlipidemia No date: Hypertension No date: Hypothyroidism No date: Moderate pulmonary hypertension (HCC) No date: TB lung, latent No date: Thyroid disease  Past Surgical History: 02/01/2022: A/V FISTULAGRAM; N/A     Comment:  Procedure: A/V Fistulagram;  Surgeon: Katha Cabal, MD;  Location: [place] [facility];  Service:               Cardiovascular;  Laterality: N/A; 01/18/2017: AV FISTULA PLACEMENT; Left     Comment:  Procedure: ARTERIOVENOUS (AV) FISTULA CREATION (               BRACHIAL CEPHALIC );  Surgeon: Katha Cabal, MD;                Location: [facility];  Service: Vascular;  Laterality:               Left; 04/30/2013: BREAST BIOPSY; Right     Comment:  intraductal papilloma 04/30/2013: BREAST BIOPSY; Right     Comment:  cyst aspiration 05/27/2013: BREAST BIOPSY; Right     Comment:  subareolar duct excision 05/27/2013: BREAST BIOPSY; Left      Comment:  subareolar duct excision 08/11/2016: BREAST BIOPSY; Left     Comment:  path pending 06/28/2018: COLONOSCOPY 02/01/2022: DIALYSIS/PERMA CATHETER INSERTION; N/A     Comment:  Procedure: DIALYSIS/PERMA CATHETER INSERTION;  Surgeon:               Katha Cabal, MD;  Location: [place] [facility];               Service: Cardiovascular;  Laterality: N/A; 1960's: HERNIA REPAIR No date: PARTIAL HYSTERECTOMY     Comment:  1980's 04/18/2017: PERIPHERAL VASCULAR THROMBECTOMY; Left     Comment:  Procedure: PERIPHERAL VASCULAR THROMBECTOMY;  Surgeon:               Katha Cabal, MD;  Location: [place] [facility];               Service: Cardiovascular;  Laterality: Left;  BMI    Body Mass Index: 27.40 kg/m      Reproductive/Obstetrics negative OB ROS                             Anesthesia Physical Anesthesia Plan  ASA: 3  Anesthesia Plan: MAC   Post-op Pain Management: Regional block*  Induction: Intravenous  PONV Risk Score and Plan: Ondansetron and Midazolam  Airway Management Planned: Natural Airway  Additional Equipment:   Intra-op Plan:   Post-operative Plan:   Informed Consent: I have reviewed the patients History and Physical, chart, labs and discussed the procedure including the risks, benefits and alternatives for the proposed anesthesia with the patient or authorized representative who has indicated his/her understanding and acceptance.     Dental advisory given  Plan Discussed with: Anesthesiologist, CRNA and Surgeon  Anesthesia Plan Comments:         Anesthesia Quick Evaluation

## 2022-05-06 NOTE — Interval H&P Note (Signed)
History and Physical Interval Note:  05/06/2022 10:50 AM  Felicia Butler  has presented today for surgery, with the diagnosis of ESRD.  The various methods of treatment have been discussed with the patient and family. After consideration of risks, benefits and other options for treatment, the patient has consented to  Procedure(s): REVISON OF ARTERIOVENOUS FISTULA (BRACHIAL CEPHALIC) (Left) as a surgical intervention.  The patient's history has been reviewed, patient examined, no change in status, stable for surgery.  I have reviewed the patient's chart and labs.  Questions were answered to the patient's satisfaction.     [name]

## 2022-05-06 NOTE — Anesthesia Procedure Notes (Signed)
Anesthesia Regional Block: Supraclavicular block (with axillary ring block)   Pre-Anesthetic Checklist: , timeout performed,  Correct Patient, Correct Site, Correct Laterality,  Correct Procedure, Correct Position, site marked,  Risks and benefits discussed,  Surgical consent,  Pre-op evaluation,  At surgeon's request and post-op pain management  Laterality: Left  Prep: chloraprep       Needles:  Injection technique: Single-shot  Needle Type: Stimiplex     Needle Length: 9cm  Needle Gauge: 22     Additional Needles:   Procedures:,,,, ultrasound used (permanent image in chart),,    Narrative:  Start time: 05/06/2022 9:40 AM End time: 05/06/2022 9:48 AM Injection made incrementally with aspirations every 5 mL.  Performed by: Personally  Anesthesiologist: Iran Ouch, MD  Additional Notes: Patient consented for risk and benefits of nerve block including but not limited to nerve damage, failed block, bleeding and infection.  Patient voiced understanding.  Functioning IV was confirmed and monitors were applied.  Timeout done prior to procedure and prior to any sedation being given to the patient.  Patient confirmed procedure site prior to any sedation given to the patient. Sterile prep,hand hygiene and sterile gloves were used.  Minimal sedation used for procedure.  No paresthesia endorsed by patient during the procedure.  Negative aspiration and negative test dose prior to incremental administration of local anesthetic. The patient tolerated the procedure well with no immediate complications.

## 2022-05-06 NOTE — Anesthesia Procedure Notes (Signed)
Procedure Name: MAC Date/Time: 05/06/2022 11:36 AM  Performed by: Biagio Borg, CRNAPre-anesthesia Checklist: Patient identified, Emergency Drugs available, Suction available, Patient being monitored and Timeout performed Patient Re-evaluated:Patient Re-evaluated prior to induction Oxygen Delivery Method: Simple face mask Induction Type: IV induction Placement Confirmation: positive ETCO2 and CO2 detector

## 2022-05-06 NOTE — H&P (View-Only) (Signed)
MRN : [id number]  Felicia Butler is a 61 y.o. ([birth year]) female who presents with chief complaint of check access.  History of Present Illness:   Patient presents to [facility] surgery center today for revision of their AV access  The patient is status post intervention of their dialysis access 02/01/2022.    Procedure: Having successfully treated the venous outflow in preparation for revision of her aneurysmal access I then turned to placing a tunneled catheter.    Current access is via a catheter which is functioning poorly the flow rates have been less than ideal.  There have not been multiple episodes of catheter infection.  The patient denies fever and chills while on dialysis.  No tenderness or drainage at the exit site.   No recent shortening of the patient's walking distance or new symptoms consistent with claudication.  No history of rest pain symptoms. No new ulcers or wounds of the lower extremities have occurred.   The patient denies amaurosis fugax or recent TIA symptoms. There are no recent neurological changes noted. There is no history of DVT, PE or superficial thrombophlebitis. No recent episodes of angina or shortness of breath documented.    Current Meds  Medication Sig   acetaminophen (TYLENOL) 500 MG tablet Take 1,000 mg by mouth every 6 (six) hours as needed.   amLODipine (NORVASC) 10 MG tablet Take 10 mg by mouth daily.   atorvastatin (LIPITOR) 80 MG tablet Take 80 mg by mouth at bedtime.   AURYXIA 1 GM 210 MG(Fe) tablet Take 630 mg by mouth 3 (three) times daily.   cinacalcet (SENSIPAR) 30 MG tablet Take 30 mg by mouth daily.   famotidine (PEPCID) 20 MG tablet Take 20 mg by mouth 2 (two) times daily.   hydrALAZINE (APRESOLINE) 50 MG tablet Take 50 mg by mouth 2 (two) times daily.   levothyroxine (SYNTHROID, LEVOTHROID) 75 MCG tablet Take 75 mcg by mouth daily before breakfast.   losartan (COZAAR) 100 MG tablet  Take 100 mg by mouth daily.    Past Medical History:  Diagnosis Date   Anemia    Chronic kidney disease    Dialysis patient [facility])    M-W-F   GERD (gastroesophageal reflux disease)    Gout    Hernia    Hyperlipidemia    Hypertension    Hypothyroidism    Moderate pulmonary hypertension ([place])    TB lung, latent    Thyroid disease     Past Surgical History:  Procedure Laterality Date   A/V FISTULAGRAM N/A 02/01/2022   Procedure: A/V Fistulagram;  Surgeon: Katha Cabal, MD;  Location: [place] [facility];  Service: Cardiovascular;  Laterality: N/A;   AV FISTULA PLACEMENT Left 01/18/2017   Procedure: ARTERIOVENOUS (AV) FISTULA CREATION ( BRACHIAL CEPHALIC );  Surgeon: Katha Cabal, MD;  Location: [facility];  Service: Vascular;  Laterality: Left;   BREAST BIOPSY Right 04/30/2013   intraductal papilloma   BREAST BIOPSY Right 04/30/2013   cyst aspiration   BREAST BIOPSY Right 05/27/2013   subareolar duct excision   BREAST BIOPSY Left 05/27/2013   subareolar duct excision   BREAST BIOPSY Left 08/11/2016   path pending   COLONOSCOPY  06/28/2018   DIALYSIS/PERMA CATHETER INSERTION N/A 02/01/2022   Procedure: DIALYSIS/PERMA CATHETER INSERTION;  Surgeon: Katha Cabal, MD;  Location: [place] [facility];  Service: Cardiovascular;  Laterality: N/A;   HERNIA REPAIR  1960's   PARTIAL HYSTERECTOMY     1980's   PERIPHERAL VASCULAR THROMBECTOMY Left 04/18/2017   Procedure: PERIPHERAL VASCULAR THROMBECTOMY;  Surgeon: Katha Cabal, MD;  Location: [place] [facility];  Service: Cardiovascular;  Laterality: Left;    Social History Social History   Tobacco Use   Smoking status: Former    Years: 5.00    Types: Cigarettes    Quit date: 05/31/1983    Years since quitting: 38.9   Smokeless tobacco: Never  Vaping Use   Vaping Use: Never used  Substance Use Topics   Alcohol use: No   Drug use: No    Family History Family History  Problem Relation Age of Onset    Cancer Mother    Diabetes Father    Stroke Father     No Known Allergies   REVIEW OF SYSTEMS (Negative unless checked)  Constitutional: '[]'$ Weight loss  '[]'$ Fever  '[]'$ Chills Cardiac: '[]'$ Chest pain   '[]'$ Chest pressure   '[]'$ Palpitations   '[]'$ Shortness of breath when laying flat   '[]'$ Shortness of breath with exertion. Vascular:  '[]'$ Pain in legs with walking   '[]'$ Pain in legs at rest  '[]'$ History of DVT   '[]'$ Phlebitis   '[]'$ Swelling in legs   '[]'$ Varicose veins   '[]'$ Non-healing ulcers Pulmonary:   '[]'$ Uses home oxygen   '[]'$ Productive cough   '[]'$ Hemoptysis   '[]'$ Wheeze  '[]'$ COPD   '[]'$ Asthma Neurologic:  '[]'$ Dizziness   '[]'$ Seizures   '[]'$ History of stroke   '[]'$ History of TIA  '[]'$ Aphasia   '[]'$ Vissual changes   '[]'$ Weakness or numbness in arm   '[]'$ Weakness or numbness in leg Musculoskeletal:   '[]'$ Joint swelling   '[]'$ Joint pain   '[]'$ Low back pain Hematologic:  '[]'$ Easy bruising  '[]'$ Easy bleeding   '[]'$ Hypercoagulable state   '[]'$ Anemic Gastrointestinal:  '[]'$ Diarrhea   '[]'$ Vomiting  '[]'$ Gastroesophageal reflux/heartburn   '[]'$ Difficulty swallowing. Genitourinary:  '[x]'$ Chronic kidney disease   '[]'$ Difficult urination  '[]'$ Frequent urination   '[]'$ Blood in urine Skin:  '[]'$ Rashes   '[]'$ Ulcers  Psychological:  '[]'$ History of anxiety   '[]'$  History of major depression.  Physical Examination  Vitals:   05/06/22 0832 05/06/22 0935 05/06/22 0940 05/06/22 0945  BP: (!) 167/84 (!) 155/74 (!) 147/73 (!) 153/77  Pulse: 89 76 81 78  Resp: 16     Temp: 97.6 F (36.4 C)     TempSrc: Temporal     SpO2: 96% 100% 100% 100%  Weight: 65.8 kg     Height: '5\' 1"'$  (1.549 m)      Body mass index is 27.4 kg/m. Gen: WD/WN, NAD Head: [place]/AT, No temporalis wasting.  Ear/Nose/Throat: Hearing grossly intact, nares w/o erythema or drainage Eyes: PER, EOMI, sclera nonicteric.  Neck: Supple, no gross masses or lesions.  No JVD.  Pulmonary:  Good air movement, no audible wheezing, no use of accessory muscles.  Cardiac: RRR, precordium non-hyperdynamic. Vascular:   Left arm access  demonstrates multiple very large aneurysms with significant thinning of the skin and depigmentation Vessel Right Left  Radial Palpable Palpable  Brachial Palpable Palpable  Gastrointestinal: soft, non-distended. No guarding/no peritoneal signs.  Musculoskeletal: M/S 5/5 throughout.  No deformity.  Neurologic: CN 2-12 intact. Pain and light touch intact in extremities.  Symmetrical.  Speech is fluent. Motor exam as listed above. Psychiatric: Judgment intact, Mood & affect appropriate for pt's clinical situation. Dermatologic: No rashes or ulcers noted.  No changes consistent with cellulitis.   CBC Lab Results  Component Value Date  WBC 4.7 05/03/2022   HGB 12.6 05/06/2022   HCT 37.0 05/06/2022   MCV 84.6 05/03/2022   PLT 102 (L) 05/03/2022    BMET    Component Value Date/Time   NA 138 05/06/2022 0835   NA 138 06/21/2013 0048   K 3.1 (L) 05/06/2022 0835   K 4.1 06/21/2013 0048   CL 101 05/06/2022 0835   CL 108 (H) 06/21/2013 0048   CO2 32 05/03/2022 0902   CO2 22 06/21/2013 0048   GLUCOSE 94 05/06/2022 0835   GLUCOSE 122 (H) 06/21/2013 0048   BUN 41 (H) 05/06/2022 0835   BUN 44 (H) 06/21/2013 0048   CREATININE 10.10 (H) 05/06/2022 0835   CREATININE 3.33 (H) 06/21/2013 0048   CALCIUM 9.0 05/03/2022 0902   CALCIUM 9.6 06/21/2013 0048   GFRNONAA 7 (L) 05/03/2022 0902   GFRNONAA 15 (L) 06/21/2013 0048   GFRAA 5 (L) 01/10/2017 0828   GFRAA 17 (L) 06/21/2013 0048   Estimated Creatinine Clearance: 5 mL/min (A) (by C-G formula based on SCr of 10.1 mg/dL (H)).  COAG Lab Results  Component Value Date   INR 0.95 01/10/2017    Radiology [place] OR NERVE BLOCK-IMAGE ONLY [facility]  Result Date: 05/06/2022 There is no interpretation for this exam.  This order is for images obtained during a surgical procedure.  Please See "Surgeries" Tab for more information regarding the procedure.     Assessment/Plan 1. End stage renal disease ([place]) Recommend:   At this time the patient does  not have appropriate extremity access for dialysis   Patient should have a revision of her current access.   The risks, benefits and alternative therapies were reviewed in detail with the patient.  All questions were answered.  The patient agrees to proceed with surgery.    The patient will follow up with me in the office after the surgery.    2. Essential hypertension Continue antihypertensive medications as already ordered, these medications have been reviewed and there are no changes at this time.    3. CAD in native artery Continue cardiac and antihypertensive medications as already ordered and reviewed, no changes at this time.   Continue statin as ordered and reviewed, no changes at this time   Nitrates PRN for chest pain    4. Type 2 diabetes mellitus with stage 3 chronic kidney disease, unspecified whether long term insulin use, unspecified whether stage 3a or 3b CKD ([place]) Continue hypoglycemic medications as already ordered, these medications have been reviewed and there are no changes at this time.   Hgb A1C to be monitored as already arranged by primary service    5. Hyperlipidemia, unspecified hyperlipidemia type Continue statin as ordered and reviewed, no changes at this time    [name], MD  05/06/2022 10:46 AM

## 2022-05-06 NOTE — Anesthesia Procedure Notes (Signed)
Procedure Name: LMA Insertion Date/Time: 05/06/2022 11:57 AM  Performed by: Tollie Eth, CRNAPre-anesthesia Checklist: Patient identified, Patient being monitored, Timeout performed, Emergency Drugs available and Suction available Patient Re-evaluated:Patient Re-evaluated prior to induction Oxygen Delivery Method: Circle system utilized Preoxygenation: Pre-oxygenation with 100% oxygen Induction Type: IV induction Ventilation: Mask ventilation without difficulty LMA: LMA inserted LMA Size: 3.0 Tube type: Oral Number of attempts: 1 Placement Confirmation: positive ETCO2 and breath sounds checked- equal and bilateral Tube secured with: Tape Dental Injury: Teeth and Oropharynx as per pre-operative assessment

## 2022-05-06 NOTE — Progress Notes (Signed)
MRN : [id number]  Felicia Butler is a 61 y.o. (28-Nov-1960) female who presents with chief complaint of check access.  History of Present Illness:   Patient presents to [facility] today for revision of their AV access  The patient is status post intervention of their dialysis access 02/01/2022.    Procedure: Having successfully treated the venous outflow in preparation for revision of her aneurysmal access I then turned to placing a tunneled catheter.    Current access is via a catheter which is functioning poorly the flow rates have been less than ideal.  There have not been multiple episodes of catheter infection.  The patient denies fever and chills while on dialysis.  No tenderness or drainage at the exit site.   No recent shortening of the patient's walking distance or new symptoms consistent with claudication.  No history of rest pain symptoms. No new ulcers or wounds of the lower extremities have occurred.   The patient denies amaurosis fugax or recent TIA symptoms. There are no recent neurological changes noted. There is no history of DVT, PE or superficial thrombophlebitis. No recent episodes of angina or shortness of breath documented.    Current Meds  Medication Sig   acetaminophen (TYLENOL) 500 MG tablet Take 1,000 mg by mouth every 6 (six) hours as needed.   amLODipine (NORVASC) 10 MG tablet Take 10 mg by mouth daily.   atorvastatin (LIPITOR) 80 MG tablet Take 80 mg by mouth at bedtime.   AURYXIA 1 GM 210 MG(Fe) tablet Take 630 mg by mouth 3 (three) times daily.   cinacalcet (SENSIPAR) 30 MG tablet Take 30 mg by mouth daily.   famotidine (PEPCID) 20 MG tablet Take 20 mg by mouth 2 (two) times daily.   hydrALAZINE (APRESOLINE) 50 MG tablet Take 50 mg by mouth 2 (two) times daily.   levothyroxine (SYNTHROID, LEVOTHROID) 75 MCG tablet Take 75 mcg by mouth daily before breakfast.   losartan (COZAAR) 100 MG tablet  Take 100 mg by mouth daily.    Past Medical History:  Diagnosis Date   Anemia    Chronic kidney disease    Dialysis patient [facility])    M-W-F   GERD (gastroesophageal reflux disease)    Gout    Hernia    Hyperlipidemia    Hypertension    Hypothyroidism    Moderate pulmonary hypertension ([place])    TB lung, latent    Thyroid disease     Past Surgical History:  Procedure Laterality Date   A/V FISTULAGRAM N/A 02/01/2022   Procedure: A/V Fistulagram;  Surgeon: Katha Cabal, MD;  Location: [place] [facility];  Service: Cardiovascular;  Laterality: N/A;   AV FISTULA PLACEMENT Left 01/18/2017   Procedure: ARTERIOVENOUS (AV) FISTULA CREATION ( BRACHIAL CEPHALIC );  Surgeon: Katha Cabal, MD;  Location: [facility];  Service: Vascular;  Laterality: Left;   BREAST BIOPSY Right 04/30/2013   intraductal papilloma   BREAST BIOPSY Right 04/30/2013   cyst aspiration   BREAST BIOPSY Right 05/27/2013   subareolar duct excision   BREAST BIOPSY Left 05/27/2013   subareolar duct excision   BREAST BIOPSY Left 08/11/2016   path pending   COLONOSCOPY  06/28/2018   DIALYSIS/PERMA CATHETER INSERTION N/A 02/01/2022   Procedure: DIALYSIS/PERMA CATHETER INSERTION;  Surgeon: Katha Cabal, MD;  Location: [place] [facility];  Service: Cardiovascular;  Laterality: N/A;   HERNIA REPAIR  1960's   PARTIAL HYSTERECTOMY     1980's   PERIPHERAL VASCULAR THROMBECTOMY Left 04/18/2017   Procedure: PERIPHERAL VASCULAR THROMBECTOMY;  Surgeon: Katha Cabal, MD;  Location: [place] [facility];  Service: Cardiovascular;  Laterality: Left;    Social History Social History   Tobacco Use   Smoking status: Former    Years: 5.00    Types: Cigarettes    Quit date: 05/31/1983    Years since quitting: 38.9   Smokeless tobacco: Never  Vaping Use   Vaping Use: Never used  Substance Use Topics   Alcohol use: No   Drug use: No    Family History Family History  Problem Relation Age of Onset    Cancer Mother    Diabetes Father    Stroke Father     No Known Allergies   REVIEW OF SYSTEMS (Negative unless checked)  Constitutional: '[]'$ Weight loss  '[]'$ Fever  '[]'$ Chills Cardiac: '[]'$ Chest pain   '[]'$ Chest pressure   '[]'$ Palpitations   '[]'$ Shortness of breath when laying flat   '[]'$ Shortness of breath with exertion. Vascular:  '[]'$ Pain in legs with walking   '[]'$ Pain in legs at rest  '[]'$ History of DVT   '[]'$ Phlebitis   '[]'$ Swelling in legs   '[]'$ Varicose veins   '[]'$ Non-healing ulcers Pulmonary:   '[]'$ Uses home oxygen   '[]'$ Productive cough   '[]'$ Hemoptysis   '[]'$ Wheeze  '[]'$ COPD   '[]'$ Asthma Neurologic:  '[]'$ Dizziness   '[]'$ Seizures   '[]'$ History of stroke   '[]'$ History of TIA  '[]'$ Aphasia   '[]'$ Vissual changes   '[]'$ Weakness or numbness in arm   '[]'$ Weakness or numbness in leg Musculoskeletal:   '[]'$ Joint swelling   '[]'$ Joint pain   '[]'$ Low back pain Hematologic:  '[]'$ Easy bruising  '[]'$ Easy bleeding   '[]'$ Hypercoagulable state   '[]'$ Anemic Gastrointestinal:  '[]'$ Diarrhea   '[]'$ Vomiting  '[]'$ Gastroesophageal reflux/heartburn   '[]'$ Difficulty swallowing. Genitourinary:  '[x]'$ Chronic kidney disease   '[]'$ Difficult urination  '[]'$ Frequent urination   '[]'$ Blood in urine Skin:  '[]'$ Rashes   '[]'$ Ulcers  Psychological:  '[]'$ History of anxiety   '[]'$  History of major depression.  Physical Examination  Vitals:   05/06/22 0832 05/06/22 0935 05/06/22 0940 05/06/22 0945  BP: (!) 167/84 (!) 155/74 (!) 147/73 (!) 153/77  Pulse: 89 76 81 78  Resp: 16     Temp: 97.6 F (36.4 C)     TempSrc: Temporal     SpO2: 96% 100% 100% 100%  Weight: 65.8 kg     Height: '5\' 1"'$  (1.549 m)      Body mass index is 27.4 kg/m. Gen: WD/WN, NAD Head: [place]/AT, No temporalis wasting.  Ear/Nose/Throat: Hearing grossly intact, nares w/o erythema or drainage Eyes: PER, EOMI, sclera nonicteric.  Neck: Supple, no gross masses or lesions.  No JVD.  Pulmonary:  Good air movement, no audible wheezing, no use of accessory muscles.  Cardiac: RRR, precordium non-hyperdynamic. Vascular:   Left arm access  demonstrates multiple very large aneurysms with significant thinning of the skin and depigmentation Vessel Right Left  Radial Palpable Palpable  Brachial Palpable Palpable  Gastrointestinal: soft, non-distended. No guarding/no peritoneal signs.  Musculoskeletal: M/S 5/5 throughout.  No deformity.  Neurologic: CN 2-12 intact. Pain and light touch intact in extremities.  Symmetrical.  Speech is fluent. Motor exam as listed above. Psychiatric: Judgment intact, Mood & affect appropriate for pt's clinical situation. Dermatologic: No rashes or ulcers noted.  No changes consistent with cellulitis.   CBC Lab Results  Component Value Date  WBC 4.7 05/03/2022   HGB 12.6 05/06/2022   HCT 37.0 05/06/2022   MCV 84.6 05/03/2022   PLT 102 (L) 05/03/2022    BMET    Component Value Date/Time   NA 138 05/06/2022 0835   NA 138 06/21/2013 0048   K 3.1 (L) 05/06/2022 0835   K 4.1 06/21/2013 0048   CL 101 05/06/2022 0835   CL 108 (H) 06/21/2013 0048   CO2 32 05/03/2022 0902   CO2 22 06/21/2013 0048   GLUCOSE 94 05/06/2022 0835   GLUCOSE 122 (H) 06/21/2013 0048   BUN 41 (H) 05/06/2022 0835   BUN 44 (H) 06/21/2013 0048   CREATININE 10.10 (H) 05/06/2022 0835   CREATININE 3.33 (H) 06/21/2013 0048   CALCIUM 9.0 05/03/2022 0902   CALCIUM 9.6 06/21/2013 0048   GFRNONAA 7 (L) 05/03/2022 0902   GFRNONAA 15 (L) 06/21/2013 0048   GFRAA 5 (L) 01/10/2017 0828   GFRAA 17 (L) 06/21/2013 0048   Estimated Creatinine Clearance: 5 mL/min (A) (by C-G formula based on SCr of 10.1 mg/dL (H)).  COAG Lab Results  Component Value Date   INR 0.95 01/10/2017    Radiology [place] OR NERVE BLOCK-IMAGE ONLY [facility])  Result Date: 05/06/2022 There is no interpretation for this exam.  This order is for images obtained during a surgical procedure.  Please See "Surgeries" Tab for more information regarding the procedure.     Assessment/Plan 1. End stage renal disease ([place]) Recommend:   At this time the patient does  not have appropriate extremity access for dialysis   Patient should have a revision of her current access.   The risks, benefits and alternative therapies were reviewed in detail with the patient.  All questions were answered.  The patient agrees to proceed with surgery.    The patient will follow up with me in the office after the surgery.    2. Essential hypertension Continue antihypertensive medications as already ordered, these medications have been reviewed and there are no changes at this time.    3. CAD in native artery Continue cardiac and antihypertensive medications as already ordered and reviewed, no changes at this time.   Continue statin as ordered and reviewed, no changes at this time   Nitrates PRN for chest pain    4. Type 2 diabetes mellitus with stage 3 chronic kidney disease, unspecified whether long term insulin use, unspecified whether stage 3a or 3b CKD ([place]) Continue hypoglycemic medications as already ordered, these medications have been reviewed and there are no changes at this time.   Hgb A1C to be monitored as already arranged by primary service    5. Hyperlipidemia, unspecified hyperlipidemia type Continue statin as ordered and reviewed, no changes at this time    [name], MD  05/06/2022 10:46 AM

## 2022-05-06 NOTE — Discharge Instructions (Signed)
AMBULATORY SURGERY  ?DISCHARGE INSTRUCTIONS ? ? ?The drugs that you were given will stay in your system until tomorrow so for the next 24 hours you should not: ? ?Drive an automobile ?Make any legal decisions ?Drink any alcoholic beverage ? ? ?You may resume regular meals tomorrow.  Today it is better to start with liquids and gradually work up to solid foods. ? ?You may eat anything you prefer, but it is better to start with liquids, then soup and crackers, and gradually work up to solid foods. ? ? ?Please notify your doctor immediately if you have any unusual bleeding, trouble breathing, redness and pain at the surgery site, drainage, fever, or pain not relieved by medication. ? ? ? ?Additional Instructions: ? ? ? ?Please contact your physician with any problems or Same Day Surgery at [phone], Monday through Friday 6 am to 4 pm, or [place] [facility] [place] Main number at [phone].  ?

## 2022-05-09 ENCOUNTER — Encounter: Payer: Self-pay | Admitting: Vascular Surgery

## 2022-05-09 NOTE — Anesthesia Postprocedure Evaluation (Signed)
Anesthesia Post Note  Patient: Felicia Butler  Procedure(s) Performed: REVISON OF ARTERIOVENOUS FISTULA (BRACHIAL CEPHALIC) (Left)  Patient location during evaluation: PACU Anesthesia Type: General Level of consciousness: awake and alert Pain management: pain level controlled Vital Signs Assessment: post-procedure vital signs reviewed and stable Respiratory status: spontaneous breathing, nonlabored ventilation and respiratory function stable Cardiovascular status: blood pressure returned to baseline and stable Postop Assessment: no apparent nausea or vomiting Anesthetic complications: no   No notable events documented.   Last Vitals:  Vitals:   05/06/22 1554 05/06/22 1710  BP: 131/62 127/61  Pulse: 66   Resp: 16   Temp:    SpO2: 92%     Last Pain:  Vitals:   05/06/22 1554  TempSrc:   PainSc: 0-No pain                 Iran Ouch

## 2022-05-10 LAB — SURGICAL PATHOLOGY

## 2022-05-20 ENCOUNTER — Ambulatory Visit (INDEPENDENT_AMBULATORY_CARE_PROVIDER_SITE_OTHER): Payer: Medicare Other | Admitting: Nurse Practitioner

## 2022-05-20 ENCOUNTER — Encounter (INDEPENDENT_AMBULATORY_CARE_PROVIDER_SITE_OTHER): Payer: Self-pay | Admitting: Nurse Practitioner

## 2022-05-20 VITALS — BP 155/75 | HR 75 | Resp 16 | Wt 141.6 lb

## 2022-05-20 DIAGNOSIS — N186 End stage renal disease: Secondary | ICD-10-CM

## 2022-05-31 ENCOUNTER — Other Ambulatory Visit (INDEPENDENT_AMBULATORY_CARE_PROVIDER_SITE_OTHER): Payer: Self-pay | Admitting: Nurse Practitioner

## 2022-05-31 DIAGNOSIS — N186 End stage renal disease: Secondary | ICD-10-CM

## 2022-06-03 ENCOUNTER — Encounter (INDEPENDENT_AMBULATORY_CARE_PROVIDER_SITE_OTHER): Payer: Self-pay | Admitting: Nurse Practitioner

## 2022-06-03 ENCOUNTER — Ambulatory Visit (INDEPENDENT_AMBULATORY_CARE_PROVIDER_SITE_OTHER): Payer: 59

## 2022-06-03 ENCOUNTER — Ambulatory Visit (INDEPENDENT_AMBULATORY_CARE_PROVIDER_SITE_OTHER): Payer: 59 | Admitting: Nurse Practitioner

## 2022-06-03 VITALS — BP 168/87 | HR 78 | Resp 17 | Ht 61.0 in | Wt 141.0 lb

## 2022-06-03 DIAGNOSIS — N186 End stage renal disease: Secondary | ICD-10-CM | POA: Diagnosis not present

## 2022-06-03 DIAGNOSIS — I10 Essential (primary) hypertension: Secondary | ICD-10-CM | POA: Insufficient documentation

## 2022-06-03 DIAGNOSIS — N183 Chronic kidney disease, stage 3 unspecified: Secondary | ICD-10-CM | POA: Insufficient documentation

## 2022-06-03 MED ORDER — DOXYCYCLINE HYCLATE 100 MG PO CAPS: 100.0000 mg | ORAL_CAPSULE | Freq: Two times a day (BID) | ORAL | 0 refills | Status: DC

## 2022-06-05 ENCOUNTER — Encounter (INDEPENDENT_AMBULATORY_CARE_PROVIDER_SITE_OTHER): Payer: Self-pay | Admitting: Nurse Practitioner

## 2022-06-05 NOTE — Progress Notes (Signed)
Subjective:    Patient ID: Felicia Butler, female    DOB: [birth year], 62 y.o.   MRN: [id number] Chief Complaint  Patient presents with   Follow-up    2 week follow up    The patient returns today following intervention on 05/06/2022 including:  PROCEDURE: 1.  Revision left brachial cephalic fistula to a brachial axillary Artegraft 2.  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  The wound is clean dry and intact.  No evidence of dehiscence.  No evidence of infection.  Strong thrill and bruit present    Review of Systems  Skin:  Positive for wound.  All other systems reviewed and are negative.      Objective:   Physical Exam Vitals reviewed.  HENT:     Head: Normocephalic.  Cardiovascular:     Rate and Rhythm: Normal rate.     Pulses: Normal pulses.     Arteriovenous access: Left arteriovenous access is present.    Comments: Good thrill and bruit Pulmonary:     Effort: Pulmonary effort is normal.  Skin:    General: Skin is warm and dry.  Neurological:     Mental Status: She is alert and oriented to person, place, and time.  Psychiatric:        Mood and Affect: Mood normal.        Behavior: Behavior normal.        Thought Content: Thought content normal.        Judgment: Judgment normal.     BP (!) 155/75 (BP Location: Right Arm)   Pulse 75   Resp 16   Wt 141 lb 9.6 oz (64.2 kg)   LMP 11/18/1996 (Approximate)   BMI 26.76 kg/m   Past Medical History:  Diagnosis Date   Anemia    Chronic kidney disease    Dialysis patient ([place])    M-W-F   GERD (gastroesophageal reflux disease)    Gout    Hernia    Hyperlipidemia    Hypertension    Hypothyroidism    Moderate pulmonary hypertension ([facility])    TB lung, latent    Thyroid disease     Social History   Socioeconomic History   Marital status: Divorced    Spouse name: Not on file   Number of children: 1   Years of education: Not on file   Highest education level:  Not on file  Occupational History   Not on file  Tobacco Use   Smoking status: Former    Years: 5.00    Types: Cigarettes    Quit date: 05/31/1983    Years since quitting: 39.0   Smokeless tobacco: Never  Vaping Use   Vaping Use: Never used  Substance and Sexual Activity   Alcohol use: No   Drug use: No   Sexual activity: Not on file  Other Topics Concern   Not on file  Social History Narrative   Lives with sister and son   Social Determinants of Health   Financial Resource Strain: Not on file  Food Insecurity: Not on file  Transportation Needs: Not on file  Physical Activity: Not on file  Stress: Not on file  Social Connections: Not on file  Intimate Partner Violence: Not on file    Past Surgical History:  Procedure Laterality Date   A/V FISTULAGRAM N/A 02/01/2022   Procedure: A/V Fistulagram;  Surgeon: Katha Cabal, MD;  Location: [place] [facility];  Service:  Cardiovascular;  Laterality: N/A;   AV FISTULA PLACEMENT Left 01/18/2017   Procedure: ARTERIOVENOUS (AV) FISTULA CREATION ( BRACHIAL CEPHALIC );  Surgeon: Katha Cabal, MD;  Location: [facility];  Service: Vascular;  Laterality: Left;   BREAST BIOPSY Right 04/30/2013   intraductal papilloma   BREAST BIOPSY Right 04/30/2013   cyst aspiration   BREAST BIOPSY Right 05/27/2013   subareolar duct excision   BREAST BIOPSY Left 05/27/2013   subareolar duct excision   BREAST BIOPSY Left 08/11/2016   path pending   COLONOSCOPY  06/28/2018   DIALYSIS/PERMA CATHETER INSERTION N/A 02/01/2022   Procedure: DIALYSIS/PERMA CATHETER INSERTION;  Surgeon: Katha Cabal, MD;  Location: [place] [facility];  Service: Cardiovascular;  Laterality: N/A;   HERNIA REPAIR  1960's   PARTIAL HYSTERECTOMY     1980's   PERIPHERAL VASCULAR THROMBECTOMY Left 04/18/2017   Procedure: PERIPHERAL VASCULAR THROMBECTOMY;  Surgeon: Katha Cabal, MD;  Location: [place] [facility];  Service: Cardiovascular;  Laterality:  Left;   REVISON OF ARTERIOVENOUS FISTULA Left 05/06/2022   Procedure: REVISON OF ARTERIOVENOUS FISTULA (BRACHIAL CEPHALIC);  Surgeon: Katha Cabal, MD;  Location: [facility];  Service: Vascular;  Laterality: Left;    Family History  Problem Relation Age of Onset   Cancer Mother    Diabetes Father    Stroke Father     No Known Allergies     Latest Ref Rng & Units 05/06/2022    8:35 AM 05/03/2022    9:02 AM 06/21/2013   12:48 AM  CBC  WBC 4.0 - 10.5 K/uL  4.7  10.0   Hemoglobin 12.0 - 15.0 g/dL 12.6  11.1  11.7   Hematocrit 36.0 - 46.0 % 37.0  34.0  36.3   Platelets 150 - 400 K/uL  102  338       CMP     Component Value Date/Time   NA 138 05/06/2022 0835   NA 138 06/21/2013 0048   K 3.1 (L) 05/06/2022 0835   K 4.1 06/21/2013 0048   CL 101 05/06/2022 0835   CL 108 (H) 06/21/2013 0048   CO2 32 05/03/2022 0902   CO2 22 06/21/2013 0048   GLUCOSE 94 05/06/2022 0835   GLUCOSE 122 (H) 06/21/2013 0048   BUN 41 (H) 05/06/2022 0835   BUN 44 (H) 06/21/2013 0048   CREATININE 10.10 (H) 05/06/2022 0835   CREATININE 3.33 (H) 06/21/2013 0048   CALCIUM 9.0 05/03/2022 0902   CALCIUM 9.6 06/21/2013 0048   PROT 7.4 01/10/2017 0828   PROT 8.2 06/21/2013 0048   ALBUMIN 4.1 01/10/2017 0828   ALBUMIN 3.4 06/21/2013 0048   AST 13 (L) 01/10/2017 0828   AST 20 06/21/2013 0048   ALT 12 (L) 01/10/2017 0828   ALT 19 06/21/2013 0048   ALKPHOS 139 (H) 01/10/2017 0828   ALKPHOS 142 (H) 06/21/2013 0048   BILITOT 0.7 01/10/2017 0828   BILITOT 0.2 06/21/2013 0048   GFRNONAA 7 (L) 05/03/2022 0902   GFRNONAA 15 (L) 06/21/2013 0048   GFRAA 5 (L) 01/10/2017 0828   GFRAA 17 (L) 06/21/2013 0048     No results found.     Assessment & Plan:   1. End stage renal disease ([place]) The patient's wound is healing very well.  Half of the staples removed today.  Patient will return in 1 to 2 weeks for further staple removal.  Patient should continue to keep it bandaged and change daily.   Current  Outpatient Medications on File  Prior to Visit  Medication Sig Dispense Refill   acetaminophen (TYLENOL) 500 MG tablet Take 1,000 mg by mouth every 6 (six) hours as needed.     amLODipine (NORVASC) 10 MG tablet Take 10 mg by mouth daily.     atorvastatin (LIPITOR) 80 MG tablet Take 80 mg by mouth at bedtime.     AURYXIA 1 GM 210 MG(Fe) tablet Take 630 mg by mouth 3 (three) times daily.     cinacalcet (SENSIPAR) 30 MG tablet Take 30 mg by mouth daily.     famotidine (PEPCID) 20 MG tablet Take 20 mg by mouth 2 (two) times daily.     hydrALAZINE (APRESOLINE) 50 MG tablet Take 50 mg by mouth 2 (two) times daily.     levothyroxine (SYNTHROID, LEVOTHROID) 75 MCG tablet Take 75 mcg by mouth daily before breakfast.     losartan (COZAAR) 100 MG tablet Take 100 mg by mouth daily.     oxyCODONE-acetaminophen (PERCOCET) 5-325 MG tablet Take 1 tablet by mouth every 4 (four) hours as needed for severe pain. 20 tablet 0   No current facility-administered medications on file prior to visit.    There are no Patient Instructions on file for this visit. No follow-ups on file.   Kris Hartmann, NP

## 2022-06-06 ENCOUNTER — Encounter (INDEPENDENT_AMBULATORY_CARE_PROVIDER_SITE_OTHER): Payer: Self-pay | Admitting: Nurse Practitioner

## 2022-06-06 NOTE — Progress Notes (Signed)
Subjective:    Patient ID: Felicia Butler, female    DOB: Jul 14, 1960, 62 y.o.   MRN: [id number] Chief Complaint  Patient presents with   Follow-up    Ultrasound    The patient returns today following intervention on 05/06/2022 including:  PROCEDURE: 1.  Revision left brachial cephalic fistula to a brachial axillary Artegraft 2.  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  The wound is clean dry and intact.  No evidence of dehiscence.  No evidence of infection.  Previously had half of  staples removed.  Strong thrill and bruit present.  She also had first look done today at the revision itself which showed a flow volume of 2375.    Review of Systems  Skin:  Positive for wound.  All other systems reviewed and are negative.      Objective:   Physical Exam Vitals reviewed.  HENT:     Head: Normocephalic.  Cardiovascular:     Rate and Rhythm: Normal rate.     Pulses: Normal pulses.     Arteriovenous access: Left arteriovenous access is present.    Comments: Good thrill and bruit Pulmonary:     Effort: Pulmonary effort is normal.  Skin:    General: Skin is warm and dry.  Neurological:     Mental Status: She is alert and oriented to person, place, and time.  Psychiatric:        Mood and Affect: Mood normal.        Behavior: Behavior normal.        Thought Content: Thought content normal.        Judgment: Judgment normal.     BP (!) 168/87 (BP Location: Right Arm)   Pulse 78   Resp 17   Ht '5\' 1"'$  (1.549 m)   Wt 141 lb (64 kg)   LMP 11/18/1996 (Approximate)   BMI 26.64 kg/m   Past Medical History:  Diagnosis Date   Anemia    Chronic kidney disease    Dialysis patient ([place])    M-W-F   GERD (gastroesophageal reflux disease)    Gout    Hernia    Hyperlipidemia    Hypertension    Hypothyroidism    Moderate pulmonary hypertension (HCC)    TB lung, latent    Thyroid disease     Social History   Socioeconomic History    Marital status: Divorced    Spouse name: Not on file   Number of children: 1   Years of education: Not on file   Highest education level: Not on file  Occupational History   Not on file  Tobacco Use   Smoking status: Former    Years: 5.00    Types: Cigarettes    Quit date: 05/31/1983    Years since quitting: 39.0   Smokeless tobacco: Never  Vaping Use   Vaping Use: Never used  Substance and Sexual Activity   Alcohol use: No   Drug use: No   Sexual activity: Not on file  Other Topics Concern   Not on file  Social History Narrative   Lives with sister and son   Social Determinants of Health   Financial Resource Strain: Not on file  Food Insecurity: Not on file  Transportation Needs: Not on file  Physical Activity: Not on file  Stress: Not on file  Social Connections: Not on file  Intimate Partner Violence: Not on file    Past Surgical History:  Procedure Laterality Date   A/V FISTULAGRAM N/A 02/01/2022   Procedure: A/V Fistulagram;  Surgeon: Katha Cabal, MD;  Location: [facility];  Service: Cardiovascular;  Laterality: N/A;   AV FISTULA PLACEMENT Left 01/18/2017   Procedure: ARTERIOVENOUS (AV) FISTULA CREATION ( BRACHIAL CEPHALIC );  Surgeon: Katha Cabal, MD;  Location: [facility];  Service: Vascular;  Laterality: Left;   BREAST BIOPSY Right 04/30/2013   intraductal papilloma   BREAST BIOPSY Right 04/30/2013   cyst aspiration   BREAST BIOPSY Right 05/27/2013   subareolar duct excision   BREAST BIOPSY Left 05/27/2013   subareolar duct excision   BREAST BIOPSY Left 08/11/2016   path pending   COLONOSCOPY  06/28/2018   DIALYSIS/PERMA CATHETER INSERTION N/A 02/01/2022   Procedure: DIALYSIS/PERMA CATHETER INSERTION;  Surgeon: Katha Cabal, MD;  Location: [facility];  Service: Cardiovascular;  Laterality: N/A;   HERNIA REPAIR  1960's   PARTIAL HYSTERECTOMY     1980's   PERIPHERAL VASCULAR THROMBECTOMY Left 04/18/2017    Procedure: PERIPHERAL VASCULAR THROMBECTOMY;  Surgeon: Katha Cabal, MD;  Location: [facility];  Service: Cardiovascular;  Laterality: Left;   REVISON OF ARTERIOVENOUS FISTULA Left 05/06/2022   Procedure: REVISON OF ARTERIOVENOUS FISTULA (BRACHIAL CEPHALIC);  Surgeon: Katha Cabal, MD;  Location: [facility];  Service: Vascular;  Laterality: Left;    Family History  Problem Relation Age of Onset   Cancer Mother    Diabetes Father    Stroke Father     No Known Allergies     Latest Ref Rng & Units 05/06/2022    8:35 AM 05/03/2022    9:02 AM 06/21/2013   12:48 AM  CBC  WBC 4.0 - 10.5 K/uL  4.7  10.0   Hemoglobin 12.0 - 15.0 g/dL 12.6  11.1  11.7   Hematocrit 36.0 - 46.0 % 37.0  34.0  36.3   Platelets 150 - 400 K/uL  102  338       CMP     Component Value Date/Time   NA 138 05/06/2022 0835   NA 138 06/21/2013 0048   K 3.1 (L) 05/06/2022 0835   K 4.1 06/21/2013 0048   CL 101 05/06/2022 0835   CL 108 (H) 06/21/2013 0048   CO2 32 05/03/2022 0902   CO2 22 06/21/2013 0048   GLUCOSE 94 05/06/2022 0835   GLUCOSE 122 (H) 06/21/2013 0048   BUN 41 (H) 05/06/2022 0835   BUN 44 (H) 06/21/2013 0048   CREATININE 10.10 (H) 05/06/2022 0835   CREATININE 3.33 (H) 06/21/2013 0048   CALCIUM 9.0 05/03/2022 0902   CALCIUM 9.6 06/21/2013 0048   PROT 7.4 01/10/2017 0828   PROT 8.2 06/21/2013 0048   ALBUMIN 4.1 01/10/2017 0828   ALBUMIN 3.4 06/21/2013 0048   AST 13 (L) 01/10/2017 0828   AST 20 06/21/2013 0048   ALT 12 (L) 01/10/2017 0828   ALT 19 06/21/2013 0048   ALKPHOS 139 (H) 01/10/2017 0828   ALKPHOS 142 (H) 06/21/2013 0048   BILITOT 0.7 01/10/2017 0828   BILITOT 0.2 06/21/2013 0048   GFRNONAA 7 (L) 05/03/2022 0902   GFRNONAA 15 (L) 06/21/2013 0048   GFRAA 5 (L) 01/10/2017 0828   GFRAA 17 (L) 06/21/2013 0048     No results found.     Assessment & Plan:   1. End stage renal disease ([place]) The patient's wound is healing very well.  Remaining staples removed  today.  Patient tolerated this mostly  well however small area dehisced afterwards.  This was secured with a zip strip to the area.  We will have her return in 1 to 2 weeks for wound evaluation.  If she is fully healed we should be able to allow the patient to utilize this at dialysis.  Current Outpatient Medications on File Prior to Visit  Medication Sig Dispense Refill   acetaminophen (TYLENOL) 500 MG tablet Take 1,000 mg by mouth every 6 (six) hours as needed.     amLODipine (NORVASC) 10 MG tablet Take 10 mg by mouth daily.     atorvastatin (LIPITOR) 80 MG tablet Take 80 mg by mouth at bedtime.     AURYXIA 1 GM 210 MG(Fe) tablet Take 630 mg by mouth 3 (three) times daily.     cinacalcet (SENSIPAR) 30 MG tablet Take 30 mg by mouth daily.     famotidine (PEPCID) 20 MG tablet Take 20 mg by mouth 2 (two) times daily.     hydrALAZINE (APRESOLINE) 50 MG tablet Take 50 mg by mouth 2 (two) times daily.     levothyroxine (SYNTHROID, LEVOTHROID) 75 MCG tablet Take 75 mcg by mouth daily before breakfast.     losartan (COZAAR) 100 MG tablet Take 100 mg by mouth daily.     oxyCODONE-acetaminophen (PERCOCET) 5-325 MG tablet Take 1 tablet by mouth every 4 (four) hours as needed for severe pain. 20 tablet 0   No current facility-administered medications on file prior to visit.    There are no Patient Instructions on file for this visit. No follow-ups on file.   Kris Hartmann, NP

## 2022-06-17 ENCOUNTER — Ambulatory Visit (INDEPENDENT_AMBULATORY_CARE_PROVIDER_SITE_OTHER): Payer: 59 | Admitting: Nurse Practitioner

## 2022-06-17 ENCOUNTER — Encounter (INDEPENDENT_AMBULATORY_CARE_PROVIDER_SITE_OTHER): Payer: Self-pay | Admitting: Nurse Practitioner

## 2022-06-17 VITALS — BP 175/85 | HR 80 | Ht 61.0 in | Wt 141.0 lb

## 2022-06-17 DIAGNOSIS — N186 End stage renal disease: Secondary | ICD-10-CM

## 2022-07-04 ENCOUNTER — Encounter (INDEPENDENT_AMBULATORY_CARE_PROVIDER_SITE_OTHER): Payer: Self-pay | Admitting: Nurse Practitioner

## 2022-07-04 NOTE — Progress Notes (Signed)
Subjective:    Patient ID: Felicia Butler, female    DOB: [birth year], 62 y.o.   MRN: [id number] Chief Complaint  Patient presents with   Follow-up    Today the patient returns for wound evaluation of her recent revision of her left upper extremity graft site.  She had a small dehiscence following removal of her staples.  Today the dehiscence is completely resolved.  Previous tube shows adequate flow volume for use.    Review of Systems  All other systems reviewed and are negative.      Objective:   Physical Exam Vitals reviewed.  HENT:     Head: Normocephalic.  Cardiovascular:     Rate and Rhythm: Normal rate.     Pulses: Normal pulses.  Pulmonary:     Effort: Pulmonary effort is normal.  Skin:    General: Skin is warm and dry.  Neurological:     Mental Status: She is alert and oriented to person, place, and time.  Psychiatric:        Mood and Affect: Mood normal.        Behavior: Behavior normal.        Thought Content: Thought content normal.        Judgment: Judgment normal.     BP (!) 175/85   Pulse 80   Ht '5\' 1"'$  (1.549 m)   Wt 141 lb (64 kg)   LMP 11/18/1996 (Approximate)   BMI 26.64 kg/m   Past Medical History:  Diagnosis Date   Anemia    Chronic kidney disease    Dialysis patient ([place])    M-W-F   GERD (gastroesophageal reflux disease)    Gout    Hernia    Hyperlipidemia    Hypertension    Hypothyroidism    Moderate pulmonary hypertension (HCC)    TB lung, latent    Thyroid disease     Social History   Socioeconomic History   Marital status: Divorced    Spouse name: Not on file   Number of children: 1   Years of education: Not on file   Highest education level: Not on file  Occupational History   Not on file  Tobacco Use   Smoking status: Former    Years: 5.00    Types: Cigarettes    Quit date: 05/31/1983    Years since quitting: 39.1   Smokeless tobacco: Never  Vaping Use   Vaping Use: Never used  Substance and Sexual Activity    Alcohol use: No   Drug use: No   Sexual activity: Not on file  Other Topics Concern   Not on file  Social History Narrative   Lives with sister and son   Social Determinants of Health   Financial Resource Strain: Not on file  Food Insecurity: Not on file  Transportation Needs: Not on file  Physical Activity: Not on file  Stress: Not on file  Social Connections: Not on file  Intimate Partner Violence: Not on file    Past Surgical History:  Procedure Laterality Date   A/V FISTULAGRAM N/A 02/01/2022   Procedure: A/V Fistulagram;  Surgeon: Katha Cabal, MD;  Location: [facility];  Service: Cardiovascular;  Laterality: N/A;   AV FISTULA PLACEMENT Left 01/18/2017   Procedure: ARTERIOVENOUS (AV) FISTULA CREATION ( BRACHIAL CEPHALIC );  Surgeon: Katha Cabal, MD;  Location: [facility];  Service: Vascular;  Laterality: Left;   BREAST BIOPSY Right 04/30/2013   intraductal papilloma   BREAST BIOPSY  Right 04/30/2013   cyst aspiration   BREAST BIOPSY Right 05/27/2013   subareolar duct excision   BREAST BIOPSY Left 05/27/2013   subareolar duct excision   BREAST BIOPSY Left 08/11/2016   path pending   COLONOSCOPY  06/28/2018   DIALYSIS/PERMA CATHETER INSERTION N/A 02/01/2022   Procedure: DIALYSIS/PERMA CATHETER INSERTION;  Surgeon: Katha Cabal, MD;  Location: [facility];  Service: Cardiovascular;  Laterality: N/A;   HERNIA REPAIR  1960's   PARTIAL HYSTERECTOMY     1980's   PERIPHERAL VASCULAR THROMBECTOMY Left 04/18/2017   Procedure: PERIPHERAL VASCULAR THROMBECTOMY;  Surgeon: Katha Cabal, MD;  Location: [facility];  Service: Cardiovascular;  Laterality: Left;   REVISON OF ARTERIOVENOUS FISTULA Left 05/06/2022   Procedure: REVISON OF ARTERIOVENOUS FISTULA (BRACHIAL CEPHALIC);  Surgeon: Katha Cabal, MD;  Location: [facility];  Service: Vascular;  Laterality: Left;    Family History  Problem Relation Age of Onset   Cancer  Mother    Diabetes Father    Stroke Father     No Known Allergies     Latest Ref Rng & Units 05/06/2022    8:35 AM 05/03/2022    9:02 AM 06/21/2013   12:48 AM  CBC  WBC 4.0 - 10.5 K/uL  4.7  10.0   Hemoglobin 12.0 - 15.0 g/dL 12.6  11.1  11.7   Hematocrit 36.0 - 46.0 % 37.0  34.0  36.3   Platelets 150 - 400 K/uL  102  338       CMP     Component Value Date/Time   NA 138 05/06/2022 0835   NA 138 06/21/2013 0048   K 3.1 (L) 05/06/2022 0835   K 4.1 06/21/2013 0048   CL 101 05/06/2022 0835   CL 108 (H) 06/21/2013 0048   CO2 32 05/03/2022 0902   CO2 22 06/21/2013 0048   GLUCOSE 94 05/06/2022 0835   GLUCOSE 122 (H) 06/21/2013 0048   BUN 41 (H) 05/06/2022 0835   BUN 44 (H) 06/21/2013 0048   CREATININE 10.10 (H) 05/06/2022 0835   CREATININE 3.33 (H) 06/21/2013 0048   CALCIUM 9.0 05/03/2022 0902   CALCIUM 9.6 06/21/2013 0048   PROT 7.4 01/10/2017 0828   PROT 8.2 06/21/2013 0048   ALBUMIN 4.1 01/10/2017 0828   ALBUMIN 3.4 06/21/2013 0048   AST 13 (L) 01/10/2017 0828   AST 20 06/21/2013 0048   ALT 12 (L) 01/10/2017 0828   ALT 19 06/21/2013 0048   ALKPHOS 139 (H) 01/10/2017 0828   ALKPHOS 142 (H) 06/21/2013 0048   BILITOT 0.7 01/10/2017 0828   BILITOT 0.2 06/21/2013 0048   GFRNONAA 7 (L) 05/03/2022 0902   GFRNONAA 15 (L) 06/21/2013 0048   GFRAA 5 (L) 01/10/2017 0828   GFRAA 17 (L) 06/21/2013 0048     No results found.     Assessment & Plan:   1. ESRD (end stage renal disease) ([place]) Recommend:  The patient is doing well and currently has adequate dialysis access. A letter will be faxed to the patient's dialysis center indicating that it is acceptable for use.  The patient should have a duplex ultrasound of the dialysis access in 6 months. The patient will follow-up with me in the office after each ultrasound     Current Outpatient Medications on File Prior to Visit  Medication Sig Dispense Refill   acetaminophen (TYLENOL) 500 MG tablet Take 1,000 mg by  mouth every 6 (six) hours as needed.  amLODipine (NORVASC) 10 MG tablet Take 10 mg by mouth daily.     atorvastatin (LIPITOR) 80 MG tablet Take 80 mg by mouth at bedtime.     AURYXIA 1 GM 210 MG(Fe) tablet Take 630 mg by mouth 3 (three) times daily.     cinacalcet (SENSIPAR) 30 MG tablet Take 30 mg by mouth daily.     doxycycline (VIBRAMYCIN) 100 MG capsule Take 1 capsule (100 mg total) by mouth 2 (two) times daily. 14 capsule 0   famotidine (PEPCID) 20 MG tablet Take 20 mg by mouth 2 (two) times daily.     hydrALAZINE (APRESOLINE) 50 MG tablet Take 50 mg by mouth 2 (two) times daily.     levothyroxine (SYNTHROID, LEVOTHROID) 75 MCG tablet Take 75 mcg by mouth daily before breakfast.     losartan (COZAAR) 100 MG tablet Take 100 mg by mouth daily.     No current facility-administered medications on file prior to visit.    There are no Patient Instructions on file for this visit. No follow-ups on file.   Kris Hartmann, NP

## 2022-07-20 ENCOUNTER — Telehealth (INDEPENDENT_AMBULATORY_CARE_PROVIDER_SITE_OTHER): Payer: Self-pay

## 2022-07-20 NOTE — Telephone Encounter (Signed)
I received a fax from [place] at [facility] to have the patient scheduled for a permcath removal. Patient is scheduled with Dr. Lucky Cowboy on 07/28/22 with a 1:00 pm [facility]. Pre-procedure instructions will be faxed per [facility].

## 2022-07-28 ENCOUNTER — Encounter
Admission: RE | Disposition: A | Discharge status: Home / Self Care | Payer: Self-pay | Source: Home / Self Care | Attending: Vascular Surgery

## 2022-07-28 ENCOUNTER — Ambulatory Visit
Admission: RE | Admit: 2022-07-28 | Discharge: 2022-07-28 | Disposition: A | Discharge status: Home / Self Care | Payer: 59 | Attending: Vascular Surgery | Admitting: Vascular Surgery

## 2022-07-28 DIAGNOSIS — I120 Hypertensive chronic kidney disease with stage 5 chronic kidney disease or end stage renal disease: Secondary | ICD-10-CM | POA: Insufficient documentation

## 2022-07-28 DIAGNOSIS — Z95828 Presence of other vascular implants and grafts: Secondary | ICD-10-CM | POA: Diagnosis not present

## 2022-07-28 DIAGNOSIS — Z4901 Encounter for fitting and adjustment of extracorporeal dialysis catheter: Secondary | ICD-10-CM | POA: Insufficient documentation

## 2022-07-28 DIAGNOSIS — N186 End stage renal disease: Secondary | ICD-10-CM | POA: Insufficient documentation

## 2022-07-28 DIAGNOSIS — Z992 Dependence on renal dialysis: Secondary | ICD-10-CM

## 2022-07-28 DIAGNOSIS — Z87891 Personal history of nicotine dependence: Secondary | ICD-10-CM | POA: Insufficient documentation

## 2022-07-28 HISTORY — PX: DIALYSIS/PERMA CATHETER REMOVAL: CATH118289

## 2022-07-28 SURGERY — DIALYSIS/PERMA CATHETER REMOVAL
Anesthesia: LOCAL

## 2022-07-28 MED ORDER — LIDOCAINE-EPINEPHRINE (PF) 1 %-1:200000 IJ SOLN
INTRAMUSCULAR | Status: DC | PRN
  Administered 2022-07-28: 20 mL

## 2022-07-28 SURGICAL SUPPLY — 2 items
FORCEPS HALSTEAD CVD 5IN STRL (INSTRUMENTS) IMPLANT
TRAY LACERAT/PLASTIC (MISCELLANEOUS) IMPLANT

## 2022-07-28 NOTE — Discharge Instructions (Signed)
Tunneled Catheter Removal, Care After Refer to this sheet in the next few weeks. These instructions provide you with information about caring for yourself after your procedure. Your health care provider may also give you more specific instructions. Your treatment has been planned according to current medical practices, but problems sometimes occur. Call your health care provider if you have any problems or questions after your procedure. What can I expect after the procedure? After the procedure, it is common to have: Some mild redness, swelling, and pain around your catheter site.   Follow these instructions at home: Incision care  Check your removal site  every day for signs of infection. Check for: More redness, swelling, or pain. More fluid or blood. Warmth. Pus or a bad smell. Remove your dressing in 48hrs leave open to air  Activity  Return to your normal activities as told by your health care provider. Ask your health care provider what activities are safe for you. Do not lift anything that is heavier than 10 lb (4.5 kg) for 3 days  You may shower tomorrow  Contact a health care provider if: You have more fluid or blood coming from your removal site You have more redness, swelling, or pain at your incisions or around the area where your catheter was removed Your removal site feel warm to the touch. You feel unusually weak. You feel nauseous.. Get help right away if You have swelling in your arm, shoulder, neck, or face. You develop chest pain. You have difficulty breathing. You feel dizzy or light-headed. You have pus or a bad smell coming from your removal site You have a fever. You develop bleeding from your removal site, and your bleeding does not stop. This information is not intended to replace advice given to you by your health care provider. Make sure you discuss any questions you have with your health care provider. Document Released: 05/02/2012 Document Revised:  01/17/2016 Document Reviewed: 02/09/2015 Elsevier Interactive Patient Education  2017 [employer]Tunneled Catheter Removal, Care After Refer to this sheet in the next few weeks. These instructions provide you with information about caring for yourself after your procedure. Your health care provider may also give you more specific instructions. Your treatment has been planned according to current medical practices, but problems sometimes occur. Call your health care provider if you have any problems or questions after your procedure. What can I expect after the procedure? After the procedure, it is common to have: Some mild redness, swelling, and pain around your catheter site.   Follow these instructions at home: Incision care  Check your removal site  every day for signs of infection. Check for: More redness, swelling, or pain. More fluid or blood. Warmth. Pus or a bad smell. Remove your dressing in 48hrs leave open to air  Activity  Return to your normal activities as told by your health care provider. Ask your health care provider what activities are safe for you. Do not lift anything that is heavier than 10 lb (4.5 kg) for 3 days  You may shower tomorrow  Contact a health care provider if: You have more fluid or blood coming from your removal site You have more redness, swelling, or pain at your incisions or around the area where your catheter was removed Your removal site feel warm to the touch. You feel unusually weak. You feel nauseous.. Get help right away if You have swelling in your arm, shoulder, neck, or face. You develop chest pain. You have difficulty breathing.  You feel dizzy or light-headed. You have pus or a bad smell coming from your removal site You have a fever. You develop bleeding from your removal site, and your bleeding does not stop. This information is not intended to replace advice given to you by your health care provider. Make sure you discuss any  questions you have with your health care provider. Document Released: 05/02/2012 Document Revised: 01/17/2016 Document Reviewed: 02/09/2015 Elsevier Interactive Patient Education  2017 [employer].

## 2022-07-28 NOTE — H&P (Signed)
[facility] Admission History & Physical  MRN : [id number]  Felicia Butler is a 62 y.o. (1961-05-14) female who presents with chief complaint of No chief complaint on file. Marland Kitchen  History of Present Illness: I am asked to evaluate the patient by the dialysis center. The patient was sent here because they have a nonfunctioning tunneled catheter and a functioning left arm aVG.  The patient reports they're not been any problems with any of their dialysis runs. They are reporting good flows with good parameters at dialysis.   Patient denies pain or tenderness overlying the access.  There is no pain with dialysis.  The patient denies hand pain or finger pain consistent with steal syndrome.  No fevers or chills while on dialysis.    No current facility-administered medications for this encounter.    Past Medical History:  Diagnosis Date   Anemia    Chronic kidney disease    Dialysis patient [facility])    M-W-F   GERD (gastroesophageal reflux disease)    Gout    Hernia    Hyperlipidemia    Hypertension    Hypothyroidism    Moderate pulmonary hypertension ([place])    TB lung, latent    Thyroid disease     Past Surgical History:  Procedure Laterality Date   A/V FISTULAGRAM N/A 02/01/2022   Procedure: A/V Fistulagram;  Surgeon: Katha Cabal, MD;  Location: [facility];  Service: Cardiovascular;  Laterality: N/A;   AV FISTULA PLACEMENT Left 01/18/2017   Procedure: ARTERIOVENOUS (AV) FISTULA CREATION ( BRACHIAL CEPHALIC );  Surgeon: Katha Cabal, MD;  Location: [facility];  Service: Vascular;  Laterality: Left;   BREAST BIOPSY Right 04/30/2013   intraductal papilloma   BREAST BIOPSY Right 04/30/2013   cyst aspiration   BREAST BIOPSY Right 05/27/2013   subareolar duct excision   BREAST BIOPSY Left 05/27/2013   subareolar duct excision   BREAST BIOPSY Left 08/11/2016   path pending   COLONOSCOPY  06/28/2018   DIALYSIS/PERMA CATHETER INSERTION N/A  02/01/2022   Procedure: DIALYSIS/PERMA CATHETER INSERTION;  Surgeon: Katha Cabal, MD;  Location: [facility];  Service: Cardiovascular;  Laterality: N/A;   HERNIA REPAIR  1960's   PARTIAL HYSTERECTOMY     1980's   PERIPHERAL VASCULAR THROMBECTOMY Left 04/18/2017   Procedure: PERIPHERAL VASCULAR THROMBECTOMY;  Surgeon: Katha Cabal, MD;  Location: [facility];  Service: Cardiovascular;  Laterality: Left;   REVISON OF ARTERIOVENOUS FISTULA Left 05/06/2022   Procedure: REVISON OF ARTERIOVENOUS FISTULA (BRACHIAL CEPHALIC);  Surgeon: Katha Cabal, MD;  Location: [facility];  Service: Vascular;  Laterality: Left;    Social History   Tobacco Use   Smoking status: Former    Years: 5.00    Types: Cigarettes    Quit date: 05/31/1983    Years since quitting: 39.1   Smokeless tobacco: Never  Vaping Use   Vaping Use: Never used  Substance Use Topics   Alcohol use: No   Drug use: No     Family History  Problem Relation Age of Onset   Cancer Mother    Diabetes Father    Stroke Father     No family history of bleeding or clotting disorders, autoimmune disease or porphyria  No Known Allergies   REVIEW OF SYSTEMS (Negative unless checked)  Constitutional: '[]'$ Weight loss  '[]'$ Fever  '[]'$ Chills Cardiac: '[]'$ Chest pain   '[]'$ Chest pressure   '[]'$ Palpitations   '[]'$ Shortness of breath when laying flat   '[]'$   Shortness of breath at rest   '[x]'$ Shortness of breath with exertion. Vascular:  '[]'$ Pain in legs with walking   '[]'$ Pain in legs at rest   '[]'$ Pain in legs when laying flat   '[]'$ Claudication   '[]'$ Pain in feet when walking  '[]'$ Pain in feet at rest  '[]'$ Pain in feet when laying flat   '[]'$ History of DVT   '[]'$ Phlebitis   '[]'$ Swelling in legs   '[]'$ Varicose veins   '[]'$ Non-healing ulcers Pulmonary:   '[]'$ Uses home oxygen   '[]'$ Productive cough   '[]'$ Hemoptysis   '[]'$ Wheeze  '[]'$ COPD   '[]'$ Asthma Neurologic:  '[]'$ Dizziness  '[]'$ Blackouts   '[]'$ Seizures   '[]'$ History of stroke   '[]'$ History of TIA  '[]'$ Aphasia   '[]'$ Temporary  blindness   '[]'$ Dysphagia   '[]'$ Weakness or numbness in arms   '[]'$ Weakness or numbness in legs Musculoskeletal:  '[x]'$ Arthritis   '[]'$ Joint swelling   '[]'$ Joint pain   '[]'$ Low back pain Hematologic:  '[]'$ Easy bruising  '[]'$ Easy bleeding   '[]'$ Hypercoagulable state   '[x]'$ Anemic  '[]'$ Hepatitis Gastrointestinal:  '[]'$ Blood in stool   '[]'$ Vomiting blood  '[x]'$ Gastroesophageal reflux/heartburn   '[]'$ Difficulty swallowing. Genitourinary:  '[x]'$ Chronic kidney disease   '[]'$ Difficult urination  '[]'$ Frequent urination  '[]'$ Burning with urination   '[]'$ Blood in urine Skin:  '[]'$ Rashes   '[]'$ Ulcers   '[]'$ Wounds Psychological:  '[]'$ History of anxiety   '[]'$  History of major depression.  Physical Examination  There were no vitals filed for this visit. There is no height or weight on file to calculate BMI. Gen: WD/WN, NAD Head: [place]/AT, No temporalis wasting. Prominent temp pulse not noted. Ear/Nose/Throat: Hearing grossly intact, nares w/o erythema or drainage, oropharynx w/o Erythema/Exudate,  Eyes: Conjunctiva clear, sclera non-icteric Neck: Trachea midline.  No JVD.  Pulmonary:  Good air movement, respirations not labored, no use of accessory muscles.  Cardiac: RRR, normal S1, S2. Vascular: thrill in left arm AVG Vessel Right Left  Radial Palpable Palpable   Musculoskeletal: M/S 5/5 throughout.  Extremities without ischemic changes.  No deformity or atrophy.  Neurologic: Sensation grossly intact in extremities.  Symmetrical.  Speech is fluent. Motor exam as listed above. Psychiatric: Judgment intact, Mood & affect appropriate for pt's clinical situation. Dermatologic: No rashes or ulcers noted.  No cellulitis or open wounds.    CBC Lab Results  Component Value Date   WBC 4.7 05/03/2022   HGB 12.6 05/06/2022   HCT 37.0 05/06/2022   MCV 84.6 05/03/2022   PLT 102 (L) 05/03/2022    BMET    Component Value Date/Time   NA 138 05/06/2022 0835   NA 138 06/21/2013 0048   K 3.1 (L) 05/06/2022 0835   K 4.1 06/21/2013 0048   CL 101 05/06/2022  0835   CL 108 (H) 06/21/2013 0048   CO2 32 05/03/2022 0902   CO2 22 06/21/2013 0048   GLUCOSE 94 05/06/2022 0835   GLUCOSE 122 (H) 06/21/2013 0048   BUN 41 (H) 05/06/2022 0835   BUN 44 (H) 06/21/2013 0048   CREATININE 10.10 (H) 05/06/2022 0835   CREATININE 3.33 (H) 06/21/2013 0048   CALCIUM 9.0 05/03/2022 0902   CALCIUM 9.6 06/21/2013 0048   GFRNONAA 7 (L) 05/03/2022 0902   GFRNONAA 15 (L) 06/21/2013 0048   GFRAA 5 (L) 01/10/2017 0828   GFRAA 17 (L) 06/21/2013 0048   CrCl cannot be calculated (Patient's most recent lab result is older than the maximum 21 days allowed.).  COAG Lab Results  Component Value Date   INR 0.95 01/10/2017    Radiology No results found.  Assessment/Plan  1.  Complication dialysis device:  Patient's Tunneled catheter is not being used. The patient has an extremity access that is functioning well. Therefore, the patient will undergo removal of the tunneled catheter under local anesthesia.  The risks and benefits were described to the patient.  All questions were answered.  The patient agrees to proceed with angiography and intervention. Potassium will be drawn to ensure that it is an appropriate level prior to performing intervention. 2.  End-stage renal disease requiring hemodialysis:  Patient will continue dialysis therapy without further interruption 3.  Hypertension:  Patient will continue medical management; nephrology is following no changes in oral medications.    Leotis Pain, MD  07/28/2022 1:11 PM

## 2022-07-28 NOTE — Op Note (Signed)
Operative Note     Preoperative diagnosis:   1. ESRD with functional permanent access  Postoperative diagnosis:  1. ESRD with functional permanent access  Procedure:  Removal of right jugular Permcath  Surgeon:  Leotis Pain, MD  Assistant: Annalee Genta, NP  Anesthesia:  Local  EBL:  Minimal  Indication for the Procedure:  The patient has a functional permanent dialysis access and no longer needs their permcath.  This can be removed.  Risks and benefits are discussed and informed consent is obtained.  Description of the Procedure:  The patient's right neck, chest and existing catheter were sterilely prepped and draped. The area around the catheter was anesthetized copiously with 1% lidocaine. The catheter was dissected out with curved hemostats until the cuff was freed from the surrounding fibrous sheath. The fiber sheath was transected, and the catheter was then removed in its entirety using gentle traction. Pressure was held and sterile dressings were placed. The patient tolerated the procedure well and was taken to the recovery room in stable condition.     Leotis Pain  07/28/2022, 1:59 PM This note was created with Dragon Medical transcription system. Any errors in dictation are purely unintentional.

## 2022-07-29 ENCOUNTER — Encounter: Payer: Self-pay | Admitting: Vascular Surgery

## 2022-09-23 ENCOUNTER — Other Ambulatory Visit (INDEPENDENT_AMBULATORY_CARE_PROVIDER_SITE_OTHER): Payer: Self-pay | Admitting: Nurse Practitioner

## 2022-09-23 DIAGNOSIS — T829XXS Unspecified complication of cardiac and vascular prosthetic device, implant and graft, sequela: Secondary | ICD-10-CM

## 2022-09-23 DIAGNOSIS — N186 End stage renal disease: Secondary | ICD-10-CM

## 2022-09-27 ENCOUNTER — Ambulatory Visit (INDEPENDENT_AMBULATORY_CARE_PROVIDER_SITE_OTHER): Payer: 59 | Admitting: Nurse Practitioner

## 2022-09-27 ENCOUNTER — Encounter (INDEPENDENT_AMBULATORY_CARE_PROVIDER_SITE_OTHER): Payer: 59

## 2022-10-05 ENCOUNTER — Ambulatory Visit (INDEPENDENT_AMBULATORY_CARE_PROVIDER_SITE_OTHER): Payer: 59 | Admitting: Nurse Practitioner

## 2022-10-05 ENCOUNTER — Ambulatory Visit (INDEPENDENT_AMBULATORY_CARE_PROVIDER_SITE_OTHER): Payer: 59

## 2022-10-05 ENCOUNTER — Encounter (INDEPENDENT_AMBULATORY_CARE_PROVIDER_SITE_OTHER): Payer: Self-pay | Admitting: Nurse Practitioner

## 2022-10-05 VITALS — BP 181/86 | HR 75 | Resp 16 | Ht 61.0 in | Wt 139.4 lb

## 2022-10-05 DIAGNOSIS — E1122 Type 2 diabetes mellitus with diabetic chronic kidney disease: Secondary | ICD-10-CM

## 2022-10-05 DIAGNOSIS — N186 End stage renal disease: Secondary | ICD-10-CM

## 2022-10-05 DIAGNOSIS — I10 Essential (primary) hypertension: Secondary | ICD-10-CM | POA: Diagnosis not present

## 2022-10-05 DIAGNOSIS — T829XXS Unspecified complication of cardiac and vascular prosthetic device, implant and graft, sequela: Secondary | ICD-10-CM | POA: Diagnosis not present

## 2022-10-05 DIAGNOSIS — N183 Chronic kidney disease, stage 3 unspecified: Secondary | ICD-10-CM

## 2022-10-05 NOTE — Progress Notes (Signed)
Subjective:    Patient ID: Felicia Butler, female    DOB: 01-06-1961, 62 y.o.   MRN: [id number] Chief Complaint  Patient presents with  . Follow-up    URGENT: HDA/consult. abnormal flow rate. knot on access    HPI  Review of Systems     Objective:   Physical Exam  BP (!) 181/86 (BP Location: Right Arm)   Pulse 75   Resp 16   Ht 5\' 1"  (1.549 m)   Wt 139 lb 6.4 oz (63.2 kg)   LMP 11/18/1996 (Approximate)   BMI 26.34 kg/m   Past Medical History:  Diagnosis Date  . Anemia   . Chronic kidney disease   . Dialysis patient [facility])    M-W-F  . GERD (gastroesophageal reflux disease)   . Gout   . Hernia   . Hyperlipidemia   . Hypertension   . Hypothyroidism   . Moderate pulmonary hypertension (HCC)   . TB lung, latent   . Thyroid disease     Social History   Socioeconomic History  . Marital status: Divorced    Spouse name: Not on file  . Number of children: 1  . Years of education: Not on file  . Highest education level: Not on file  Occupational History  . Not on file  Tobacco Use  . Smoking status: Former    Years: 5    Types: Cigarettes    Quit date: 05/31/1983    Years since quitting: 39.3  . Smokeless tobacco: Never  Vaping Use  . Vaping Use: Never used  Substance and Sexual Activity  . Alcohol use: No  . Drug use: No  . Sexual activity: Not on file  Other Topics Concern  . Not on file  Social History Narrative   Lives with sister and son   Social Determinants of Health   Financial Resource Strain: Not on file  Food Insecurity: Not on file  Transportation Needs: Not on file  Physical Activity: Not on file  Stress: Not on file  Social Connections: Not on file  Intimate Partner Violence: Not on file    Past Surgical History:  Procedure Laterality Date  . A/V FISTULAGRAM N/A 02/01/2022   Procedure: A/V Fistulagram;  Surgeon: Renford Dills, MD;  Location: [facility];  Service: Cardiovascular;  Laterality: N/A;  . AV FISTULA  PLACEMENT Left 01/18/2017   Procedure: ARTERIOVENOUS (AV) FISTULA CREATION ( BRACHIAL CEPHALIC );  Surgeon: Renford Dills, MD;  Location: [facility];  Service: Vascular;  Laterality: Left;  . BREAST BIOPSY Right 04/30/2013   intraductal papilloma  . BREAST BIOPSY Right 04/30/2013   cyst aspiration  . BREAST BIOPSY Right 05/27/2013   subareolar duct excision  . BREAST BIOPSY Left 05/27/2013   subareolar duct excision  . BREAST BIOPSY Left 08/11/2016   path pending  . COLONOSCOPY  06/28/2018  . DIALYSIS/PERMA CATHETER INSERTION N/A 02/01/2022   Procedure: DIALYSIS/PERMA CATHETER INSERTION;  Surgeon: Renford Dills, MD;  Location: [facility];  Service: Cardiovascular;  Laterality: N/A;  . DIALYSIS/PERMA CATHETER REMOVAL N/A 07/28/2022   Procedure: DIALYSIS/PERMA CATHETER REMOVAL;  Surgeon: Annice Needy, MD;  Location: [facility];  Service: Cardiovascular;  Laterality: N/A;  . HERNIA REPAIR  1960's  . PARTIAL HYSTERECTOMY     1980's  . PERIPHERAL VASCULAR THROMBECTOMY Left 04/18/2017   Procedure: PERIPHERAL VASCULAR THROMBECTOMY;  Surgeon: Renford Dills, MD;  Location: [facility];  Service: Cardiovascular;  Laterality: Left;  .  REVISON OF ARTERIOVENOUS FISTULA Left 05/06/2022   Procedure: REVISON OF ARTERIOVENOUS FISTULA (BRACHIAL CEPHALIC);  Surgeon: Renford Dills, MD;  Location: [facility];  Service: Vascular;  Laterality: Left;    Family History  Problem Relation Age of Onset  . Cancer Mother   . Diabetes Father   . Stroke Father     No Known Allergies     Latest Ref Rng & Units 05/06/2022    8:35 AM 05/03/2022    9:02 AM 06/21/2013   12:48 AM  CBC  WBC 4.0 - 10.5 K/uL  4.7  10.0   Hemoglobin 12.0 - 15.0 g/dL 16.1  09.6  04.5   Hematocrit 36.0 - 46.0 % 37.0  34.0  36.3   Platelets 150 - 400 K/uL  102  338       CMP     Component Value Date/Time   NA 138 05/06/2022 0835   NA 138 06/21/2013 0048   K 3.1 (L) 05/06/2022 0835    K 4.1 06/21/2013 0048   CL 101 05/06/2022 0835   CL 108 (H) 06/21/2013 0048   CO2 32 05/03/2022 0902   CO2 22 06/21/2013 0048   GLUCOSE 94 05/06/2022 0835   GLUCOSE 122 (H) 06/21/2013 0048   BUN 41 (H) 05/06/2022 0835   BUN 44 (H) 06/21/2013 0048   CREATININE 10.10 (H) 05/06/2022 0835   CREATININE 3.33 (H) 06/21/2013 0048   CALCIUM 9.0 05/03/2022 0902   CALCIUM 9.6 06/21/2013 0048   PROT 7.4 01/10/2017 0828   PROT 8.2 06/21/2013 0048   ALBUMIN 4.1 01/10/2017 0828   ALBUMIN 3.4 06/21/2013 0048   AST 13 (L) 01/10/2017 0828   AST 20 06/21/2013 0048   ALT 12 (L) 01/10/2017 0828   ALT 19 06/21/2013 0048   ALKPHOS 139 (H) 01/10/2017 0828   ALKPHOS 142 (H) 06/21/2013 0048   BILITOT 0.7 01/10/2017 0828   BILITOT 0.2 06/21/2013 0048   GFRNONAA 7 (L) 05/03/2022 0902   GFRNONAA 15 (L) 06/21/2013 0048   GFRAA 5 (L) 01/10/2017 0828   GFRAA 17 (L) 06/21/2013 0048     No results found.     Assessment & Plan:   1. ESRD (end stage renal disease) (HCC) Recommend:  The patient is experiencing increasing problems with their dialysis access.  Patient should have a fistulagram with the intention for intervention.  The intention for intervention is to restore appropriate flow and prevent thrombosis and possible loss of the access.  As well as improve the quality of dialysis therapy.  The risks, benefits and alternative therapies were reviewed in detail with the patient.  All questions were answered.  The patient agrees to proceed with angio/intervention.    The patient will follow up with me in the office after the procedure.   2. Essential hypertension Continue antihypertensive medications as already ordered, these medications have been reviewed and there are no changes at this time.  3. Type 2 diabetes mellitus with stage 3 chronic kidney disease, unspecified whether long term insulin use, unspecified whether stage 3a or 3b CKD (HCC) Continue hypoglycemic medications as already  ordered, these medications have been reviewed and there are no changes at this time.  Hgb A1C to be monitored as already arranged by primary service   Current Outpatient Medications on File Prior to Visit  Medication Sig Dispense Refill  . acetaminophen (TYLENOL) 500 MG tablet Take 1,000 mg by mouth every 6 (six) hours as needed.    Marland Kitchen amLODipine (NORVASC) 10 MG tablet  Take 10 mg by mouth daily.    Marland Kitchen atorvastatin (LIPITOR) 80 MG tablet Take 80 mg by mouth at bedtime.    Gean Quint 1 GM 210 MG(Fe) tablet Take 630 mg by mouth 3 (three) times daily.    . cinacalcet (SENSIPAR) 30 MG tablet Take 30 mg by mouth daily.    . famotidine (PEPCID) 20 MG tablet Take 20 mg by mouth 2 (two) times daily.    . hydrALAZINE (APRESOLINE) 50 MG tablet Take 50 mg by mouth 2 (two) times daily.    Marland Kitchen levothyroxine (SYNTHROID, LEVOTHROID) 75 MCG tablet Take 75 mcg by mouth daily before breakfast.    . losartan (COZAAR) 100 MG tablet Take 100 mg by mouth daily.    . Methoxy PEG-Epoetin Beta (MIRCERA IJ) Mircera     No current facility-administered medications on file prior to visit.    There are no Patient Instructions on file for this visit. No follow-ups on file.   Georgiana Spinner, NP

## 2022-10-05 NOTE — H&P (View-Only) (Signed)
Subjective:    Patient ID: Felicia Butler, female    DOB: 1960-12-12, 62 y.o.   MRN: [id number] Chief Complaint  Patient presents with   Follow-up    URGENT: HDA/consult. abnormal flow rate. knot on access    The patient returns to the office for follow up regarding a problem with their dialysis access.   The patient notes a significant increase in bleeding time after decannulation.  She also has some noted new bumps on the area as well.  The patient denies hand pain or other symptoms consistent with steal phenomena.  No significant arm swelling.  The patient denies redness or swelling at the access site. The patient denies fever or chills at home or while on dialysis.  No recent shortening of the patient's walking distance or new symptoms consistent with claudication.  No history of rest pain symptoms. No new ulcers or wounds of the lower extremities have occurred.  The patient denies amaurosis fugax or recent TIA symptoms. There are no recent neurological changes noted. There is no history of DVT, PE or superficial thrombophlebitis. No recent episodes of angina or shortness of breath documented.   Duplex ultrasound of the AV access shows a patent access.  The previously noted stenosis is significantly increased compared to last study.  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  There is relatively small with the first Measuring 0.31 cm and the second measuring 0.35 cm.    Review of Systems  Hematological:  Bruises/bleeds easily.  All other systems reviewed and are negative.      Objective:   Physical Exam Vitals reviewed.  HENT:     Head: Normocephalic.  Cardiovascular:     Rate and Rhythm: Normal rate.     Pulses:          Radial pulses are 1+ on the left side.     Arteriovenous access: Left arteriovenous access is present.    Comments: Good thrill and bruit slightly pulsatile Pulmonary:      Effort: Pulmonary effort is normal.  Skin:    General: Skin is warm and dry.  Neurological:     Mental Status: She is alert and oriented to person, place, and time.  Psychiatric:        Mood and Affect: Mood normal.        Behavior: Behavior normal.        Thought Content: Thought content normal.        Judgment: Judgment normal.     BP (!) 181/86 (BP Location: Right Arm)   Pulse 75   Resp 16   Ht 5\' 1"  (1.549 m)   Wt 139 lb 6.4 oz (63.2 kg)   LMP 11/18/1996 (Approximate)   BMI 26.34 kg/m   Past Medical History:  Diagnosis Date   Anemia    Chronic kidney disease    Dialysis patient (HCC)    M-W-F   GERD (gastroesophageal reflux disease)    Gout    Hernia    Hyperlipidemia    Hypertension    Hypothyroidism    Moderate pulmonary hypertension (HCC)    TB lung, latent    Thyroid disease     Social History   Socioeconomic History   Marital status: Divorced    Spouse name: Not on file   Number of children: 1   Years of education: Not on file   Highest education level: Not on file  Occupational History   Not on file  Tobacco Use   Smoking status: Former    Years: 5    Types: Cigarettes    Quit date: 05/31/1983    Years since quitting: 39.3   Smokeless tobacco: Never  Vaping Use   Vaping Use: Never used  Substance and Sexual Activity   Alcohol use: No   Drug use: No   Sexual activity: Not on file  Other Topics Concern   Not on file  Social History Narrative   Lives with sister and son   Social Determinants of Health   Financial Resource Strain: Not on file  Food Insecurity: Not on file  Transportation Needs: Not on file  Physical Activity: Not on file  Stress: Not on file  Social Connections: Not on file  Intimate Partner Violence: Not on file    Past Surgical History:  Procedure Laterality Date   A/V FISTULAGRAM N/A 02/01/2022   Procedure: A/V Fistulagram;  Surgeon: Renford Dills, MD;  Location: [facility];  Service:  Cardiovascular;  Laterality: N/A;   AV FISTULA PLACEMENT Left 01/18/2017   Procedure: ARTERIOVENOUS (AV) FISTULA CREATION ( BRACHIAL CEPHALIC );  Surgeon: Renford Dills, MD;  Location: [facility];  Service: Vascular;  Laterality: Left;   BREAST BIOPSY Right 04/30/2013   intraductal papilloma   BREAST BIOPSY Right 04/30/2013   cyst aspiration   BREAST BIOPSY Right 05/27/2013   subareolar duct excision   BREAST BIOPSY Left 05/27/2013   subareolar duct excision   BREAST BIOPSY Left 08/11/2016   path pending   COLONOSCOPY  06/28/2018   DIALYSIS/PERMA CATHETER INSERTION N/A 02/01/2022   Procedure: DIALYSIS/PERMA CATHETER INSERTION;  Surgeon: Renford Dills, MD;  Location: [facility];  Service: Cardiovascular;  Laterality: N/A;   DIALYSIS/PERMA CATHETER REMOVAL N/A 07/28/2022   Procedure: DIALYSIS/PERMA CATHETER REMOVAL;  Surgeon: Annice Needy, MD;  Location: [facility];  Service: Cardiovascular;  Laterality: N/A;   HERNIA REPAIR  1960's   PARTIAL HYSTERECTOMY     1980's   PERIPHERAL VASCULAR THROMBECTOMY Left 04/18/2017   Procedure: PERIPHERAL VASCULAR THROMBECTOMY;  Surgeon: Renford Dills, MD;  Location: [facility];  Service: Cardiovascular;  Laterality: Left;   REVISON OF ARTERIOVENOUS FISTULA Left 05/06/2022   Procedure: REVISON OF ARTERIOVENOUS FISTULA (BRACHIAL CEPHALIC);  Surgeon: Renford Dills, MD;  Location: [facility];  Service: Vascular;  Laterality: Left;    Family History  Problem Relation Age of Onset   Cancer Mother    Diabetes Father    Stroke Father     No Known Allergies     Latest Ref Rng & Units 05/06/2022    8:35 AM 05/03/2022    9:02 AM 06/21/2013   12:48 AM  CBC  WBC 4.0 - 10.5 K/uL  4.7  10.0   Hemoglobin 12.0 - 15.0 g/dL 91.4  78.2  95.6   Hematocrit 36.0 - 46.0 % 37.0  34.0  36.3   Platelets 150 - 400 K/uL  102  338       CMP     Component Value Date/Time   NA 138 05/06/2022 0835   NA 138 06/21/2013 0048    K 3.1 (L) 05/06/2022 0835   K 4.1 06/21/2013 0048   CL 101 05/06/2022 0835   CL 108 (H) 06/21/2013 0048   CO2 32 05/03/2022 0902   CO2 22 06/21/2013 0048   GLUCOSE 94 05/06/2022 0835   GLUCOSE 122 (H) 06/21/2013 0048  BUN 41 (H) 05/06/2022 0835   BUN 44 (H) 06/21/2013 0048   CREATININE 10.10 (H) 05/06/2022 0835   CREATININE 3.33 (H) 06/21/2013 0048   CALCIUM 9.0 05/03/2022 0902   CALCIUM 9.6 06/21/2013 0048   PROT 7.4 01/10/2017 0828   PROT 8.2 06/21/2013 0048   ALBUMIN 4.1 01/10/2017 0828   ALBUMIN 3.4 06/21/2013 0048   AST 13 (L) 01/10/2017 0828   AST 20 06/21/2013 0048   ALT 12 (L) 01/10/2017 0828   ALT 19 06/21/2013 0048   ALKPHOS 139 (H) 01/10/2017 0828   ALKPHOS 142 (H) 06/21/2013 0048   BILITOT 0.7 01/10/2017 0828   BILITOT 0.2 06/21/2013 0048   GFRNONAA 7 (L) 05/03/2022 0902   GFRNONAA 15 (L) 06/21/2013 0048   GFRAA 5 (L) 01/10/2017 0828   GFRAA 17 (L) 06/21/2013 0048     No results found.     Assessment & Plan:   1. ESRD (end stage renal disease) (HCC) Recommend:  The patient is experiencing increasing problems with their dialysis access.  Patient should have a fistulagram with the intention for intervention.  The intention for intervention is to restore appropriate flow and prevent thrombosis and possible loss of the access.  As well as improve the quality of dialysis therapy.  The risks, benefits and alternative therapies were reviewed in detail with the patient.  All questions were answered.  The patient agrees to proceed with angio/intervention.    The patient will follow up with me in the office after the procedure.   2. Essential hypertension Continue antihypertensive medications as already ordered, these medications have been reviewed and there are no changes at this time.  3. Type 2 diabetes mellitus with stage 3 chronic kidney disease, unspecified whether long term insulin use, unspecified whether stage 3a or 3b CKD (HCC) Continue  hypoglycemic medications as already ordered, these medications have been reviewed and there are no changes at this time.  Hgb A1C to be monitored as already arranged by primary service   Current Outpatient Medications on File Prior to Visit  Medication Sig Dispense Refill   acetaminophen (TYLENOL) 500 MG tablet Take 1,000 mg by mouth every 6 (six) hours as needed.     amLODipine (NORVASC) 10 MG tablet Take 10 mg by mouth daily.     atorvastatin (LIPITOR) 80 MG tablet Take 80 mg by mouth at bedtime.     AURYXIA 1 GM 210 MG(Fe) tablet Take 630 mg by mouth 3 (three) times daily.     cinacalcet (SENSIPAR) 30 MG tablet Take 30 mg by mouth daily.     famotidine (PEPCID) 20 MG tablet Take 20 mg by mouth 2 (two) times daily.     hydrALAZINE (APRESOLINE) 50 MG tablet Take 50 mg by mouth 2 (two) times daily.     levothyroxine (SYNTHROID, LEVOTHROID) 75 MCG tablet Take 75 mcg by mouth daily before breakfast.     losartan (COZAAR) 100 MG tablet Take 100 mg by mouth daily.     Methoxy PEG-Epoetin Beta (MIRCERA IJ) Mircera     No current facility-administered medications on file prior to visit.    There are no Patient Instructions on file for this visit. No follow-ups on file.   Georgiana Spinner, NP

## 2022-10-06 ENCOUNTER — Other Ambulatory Visit: Payer: Self-pay | Admitting: Family Medicine

## 2022-10-06 DIAGNOSIS — Z1231 Encounter for screening mammogram for malignant neoplasm of breast: Secondary | ICD-10-CM

## 2022-10-09 ENCOUNTER — Encounter: Payer: Self-pay | Admitting: Family Medicine

## 2022-10-09 ENCOUNTER — Other Ambulatory Visit: Payer: Self-pay

## 2022-10-09 ENCOUNTER — Inpatient Hospital Stay
Admission: EM | Admit: 2022-10-09 | Discharge: 2022-10-11 | DRG: 252 | Disposition: A | Discharge status: Home / Self Care | Payer: 59 | Attending: Internal Medicine | Admitting: Internal Medicine

## 2022-10-09 ENCOUNTER — Encounter
Admission: EM | Disposition: A | Discharge status: Home / Self Care | Payer: Self-pay | Source: Home / Self Care | Attending: Internal Medicine

## 2022-10-09 ENCOUNTER — Inpatient Hospital Stay: Payer: 59 | Admitting: Anesthesiology

## 2022-10-09 DIAGNOSIS — Z87891 Personal history of nicotine dependence: Secondary | ICD-10-CM | POA: Diagnosis not present

## 2022-10-09 DIAGNOSIS — E1122 Type 2 diabetes mellitus with diabetic chronic kidney disease: Secondary | ICD-10-CM | POA: Diagnosis present

## 2022-10-09 DIAGNOSIS — Z833 Family history of diabetes mellitus: Secondary | ICD-10-CM | POA: Diagnosis not present

## 2022-10-09 DIAGNOSIS — E785 Hyperlipidemia, unspecified: Secondary | ICD-10-CM | POA: Diagnosis present

## 2022-10-09 DIAGNOSIS — E876 Hypokalemia: Secondary | ICD-10-CM | POA: Diagnosis present

## 2022-10-09 DIAGNOSIS — T829XXA Unspecified complication of cardiac and vascular prosthetic device, implant and graft, initial encounter: Secondary | ICD-10-CM | POA: Diagnosis not present

## 2022-10-09 DIAGNOSIS — M109 Gout, unspecified: Secondary | ICD-10-CM | POA: Diagnosis present

## 2022-10-09 DIAGNOSIS — Z823 Family history of stroke: Secondary | ICD-10-CM

## 2022-10-09 DIAGNOSIS — T829XXD Unspecified complication of cardiac and vascular prosthetic device, implant and graft, subsequent encounter: Secondary | ICD-10-CM | POA: Diagnosis not present

## 2022-10-09 DIAGNOSIS — I120 Hypertensive chronic kidney disease with stage 5 chronic kidney disease or end stage renal disease: Secondary | ICD-10-CM | POA: Diagnosis present

## 2022-10-09 DIAGNOSIS — Z5982 Transportation insecurity: Secondary | ICD-10-CM | POA: Diagnosis not present

## 2022-10-09 DIAGNOSIS — I272 Pulmonary hypertension, unspecified: Secondary | ICD-10-CM | POA: Diagnosis present

## 2022-10-09 DIAGNOSIS — T82510A Breakdown (mechanical) of surgically created arteriovenous fistula, initial encounter: Secondary | ICD-10-CM | POA: Diagnosis present

## 2022-10-09 DIAGNOSIS — T82319A Breakdown (mechanical) of unspecified vascular grafts, initial encounter: Secondary | ICD-10-CM

## 2022-10-09 DIAGNOSIS — Z809 Family history of malignant neoplasm, unspecified: Secondary | ICD-10-CM | POA: Diagnosis not present

## 2022-10-09 DIAGNOSIS — T82868A Thrombosis of vascular prosthetic devices, implants and grafts, initial encounter: Secondary | ICD-10-CM | POA: Diagnosis not present

## 2022-10-09 DIAGNOSIS — I2510 Atherosclerotic heart disease of native coronary artery without angina pectoris: Secondary | ICD-10-CM | POA: Diagnosis present

## 2022-10-09 DIAGNOSIS — K219 Gastro-esophageal reflux disease without esophagitis: Secondary | ICD-10-CM | POA: Diagnosis present

## 2022-10-09 DIAGNOSIS — I10 Essential (primary) hypertension: Secondary | ICD-10-CM | POA: Diagnosis present

## 2022-10-09 DIAGNOSIS — Z79890 Hormone replacement therapy (postmenopausal): Secondary | ICD-10-CM

## 2022-10-09 DIAGNOSIS — D62 Acute posthemorrhagic anemia: Secondary | ICD-10-CM | POA: Diagnosis present

## 2022-10-09 DIAGNOSIS — N2581 Secondary hyperparathyroidism of renal origin: Secondary | ICD-10-CM | POA: Diagnosis present

## 2022-10-09 DIAGNOSIS — Y841 Kidney dialysis as the cause of abnormal reaction of the patient, or of later complication, without mention of misadventure at the time of the procedure: Secondary | ICD-10-CM | POA: Diagnosis present

## 2022-10-09 DIAGNOSIS — Z23 Encounter for immunization: Secondary | ICD-10-CM | POA: Diagnosis present

## 2022-10-09 DIAGNOSIS — E039 Hypothyroidism, unspecified: Secondary | ICD-10-CM | POA: Diagnosis present

## 2022-10-09 DIAGNOSIS — T82838A Hemorrhage of vascular prosthetic devices, implants and grafts, initial encounter: Secondary | ICD-10-CM | POA: Diagnosis present

## 2022-10-09 DIAGNOSIS — D631 Anemia in chronic kidney disease: Secondary | ICD-10-CM | POA: Diagnosis present

## 2022-10-09 DIAGNOSIS — N186 End stage renal disease: Secondary | ICD-10-CM | POA: Diagnosis present

## 2022-10-09 DIAGNOSIS — Z9889 Other specified postprocedural states: Secondary | ICD-10-CM | POA: Diagnosis not present

## 2022-10-09 DIAGNOSIS — Z79899 Other long term (current) drug therapy: Secondary | ICD-10-CM

## 2022-10-09 DIAGNOSIS — Z992 Dependence on renal dialysis: Secondary | ICD-10-CM | POA: Diagnosis not present

## 2022-10-09 DIAGNOSIS — T82858A Stenosis of vascular prosthetic devices, implants and grafts, initial encounter: Secondary | ICD-10-CM | POA: Diagnosis not present

## 2022-10-09 HISTORY — PX: REVISION OF ARTERIOVENOUS GORETEX GRAFT: SHX6073

## 2022-10-09 LAB — CBC
HCT: 31.6 % — ABNORMAL LOW (ref 36.0–46.0)
Hemoglobin: 10.3 g/dL — ABNORMAL LOW (ref 12.0–15.0)
MCH: 28.3 pg (ref 26.0–34.0)
MCHC: 32.6 g/dL (ref 30.0–36.0)
MCV: 86.8 fL (ref 80.0–100.0)
Platelets: 94 10*3/uL — ABNORMAL LOW (ref 150–400)
RBC: 3.64 MIL/uL — ABNORMAL LOW (ref 3.87–5.11)
RDW: 16.9 % — ABNORMAL HIGH (ref 11.5–15.5)
WBC: 4.9 10*3/uL (ref 4.0–10.5)
nRBC: 0 % (ref 0.0–0.2)

## 2022-10-09 LAB — COMPREHENSIVE METABOLIC PANEL
ALT: 18 U/L (ref 0–44)
AST: 29 U/L (ref 15–41)
Albumin: 3.7 g/dL (ref 3.5–5.0)
Alkaline Phosphatase: 182 U/L — ABNORMAL HIGH (ref 38–126)
Anion gap: 15 (ref 5–15)
BUN: 37 mg/dL — ABNORMAL HIGH (ref 8–23)
CO2: 27 mmol/L (ref 22–32)
Calcium: 8.4 mg/dL — ABNORMAL LOW (ref 8.9–10.3)
Chloride: 96 mmol/L — ABNORMAL LOW (ref 98–111)
Creatinine, Ser: 7.73 mg/dL — ABNORMAL HIGH (ref 0.44–1.00)
GFR, Estimated: 5 mL/min — ABNORMAL LOW (ref >60)
Glucose, Bld: 95 mg/dL (ref 70–99)
Potassium: 2.9 mmol/L — ABNORMAL LOW (ref 3.5–5.1)
Sodium: 138 mmol/L (ref 135–145)
Total Bilirubin: 1.3 mg/dL — ABNORMAL HIGH (ref 0.3–1.2)
Total Protein: 7.5 g/dL (ref 6.5–8.1)

## 2022-10-09 LAB — TYPE AND SCREEN
ABO/RH(D): AB POS
Antibody Screen: NEGATIVE

## 2022-10-09 SURGERY — REVISION OF ARTERIOVENOUS GORETEX GRAFT
Anesthesia: General | Site: Arm Upper | Laterality: Left

## 2022-10-09 MED ORDER — HYDRALAZINE HCL 50 MG PO TABS
ORAL_TABLET | ORAL | Status: AC
  Filled 2022-10-09: qty 1

## 2022-10-09 MED ORDER — OXYCODONE HCL 5 MG PO TABS
5.0000 mg | ORAL_TABLET | Freq: Once | ORAL | Status: AC | PRN
  Administered 2022-10-09: 5 mg via ORAL

## 2022-10-09 MED ORDER — FENTANYL CITRATE (PF) 100 MCG/2ML IJ SOLN
INTRAMUSCULAR | Status: AC
  Filled 2022-10-09: qty 2

## 2022-10-09 MED ORDER — PROPOFOL 10 MG/ML IV BOLUS
INTRAVENOUS | Status: DC | PRN
  Administered 2022-10-09: 80 mg via INTRAVENOUS
  Administered 2022-10-09: 20 mg via INTRAVENOUS

## 2022-10-09 MED ORDER — FERRIC CITRATE 1 GM 210 MG(FE) PO TABS
630.0000 mg | ORAL_TABLET | Freq: Three times a day (TID) | ORAL | Status: DC
  Administered 2022-10-09 – 2022-10-11 (×3): 630 mg via ORAL
  Filled 2022-10-09 (×7): qty 3

## 2022-10-09 MED ORDER — CEFAZOLIN SODIUM-DEXTROSE 2-3 GM-%(50ML) IV SOLR
INTRAVENOUS | Status: DC | PRN
  Administered 2022-10-09: 1 g via INTRAVENOUS

## 2022-10-09 MED ORDER — POTASSIUM CHLORIDE CRYS ER 20 MEQ PO TBCR
20.0000 meq | EXTENDED_RELEASE_TABLET | Freq: Once | ORAL | Status: AC
  Administered 2022-10-09: 20 meq via ORAL
  Filled 2022-10-09: qty 1

## 2022-10-09 MED ORDER — SODIUM CHLORIDE 0.9 % IV SOLN: INTRAVENOUS | Status: DC | PRN

## 2022-10-09 MED ORDER — AMLODIPINE BESYLATE 10 MG PO TABS
10.0000 mg | ORAL_TABLET | Freq: Every day | ORAL | Status: DC
  Administered 2022-10-09 – 2022-10-11 (×2): 10 mg via ORAL
  Filled 2022-10-09: qty 2
  Filled 2022-10-09: qty 1

## 2022-10-09 MED ORDER — OXYCODONE HCL 5 MG/5ML PO SOLN: 5.0000 mg | Freq: Once | ORAL | Status: AC | PRN

## 2022-10-09 MED ORDER — DEXAMETHASONE SODIUM PHOSPHATE 10 MG/ML IJ SOLN
INTRAMUSCULAR | Status: DC | PRN
  Administered 2022-10-09: 5 mg via INTRAVENOUS

## 2022-10-09 MED ORDER — ONDANSETRON HCL 4 MG PO TABS: 4.0000 mg | ORAL_TABLET | Freq: Four times a day (QID) | ORAL | Status: DC | PRN

## 2022-10-09 MED ORDER — CINACALCET HCL 30 MG PO TABS
30.0000 mg | ORAL_TABLET | Freq: Every day | ORAL | Status: DC
  Administered 2022-10-11: 30 mg via ORAL
  Filled 2022-10-09 (×3): qty 1

## 2022-10-09 MED ORDER — SURGIFLO WITH THROMBIN (HEMOSTATIC MATRIX KIT) OPTIME
TOPICAL | Status: DC | PRN
  Administered 2022-10-09: 1 via TOPICAL

## 2022-10-09 MED ORDER — ATORVASTATIN CALCIUM 20 MG PO TABS
80.0000 mg | ORAL_TABLET | Freq: Every day | ORAL | Status: DC
  Administered 2022-10-09 – 2022-10-10 (×2): 80 mg via ORAL
  Filled 2022-10-09 (×2): qty 4

## 2022-10-09 MED ORDER — FENTANYL CITRATE (PF) 100 MCG/2ML IJ SOLN
25.0000 ug | INTRAMUSCULAR | Status: DC | PRN
  Administered 2022-10-09: 50 ug via INTRAVENOUS

## 2022-10-09 MED ORDER — LEVOTHYROXINE SODIUM 50 MCG PO TABS
75.0000 ug | ORAL_TABLET | Freq: Every day | ORAL | Status: DC
  Administered 2022-10-11: 75 ug via ORAL
  Filled 2022-10-09: qty 2

## 2022-10-09 MED ORDER — BUPIVACAINE-EPINEPHRINE (PF) 0.5% -1:200000 IJ SOLN
INTRAMUSCULAR | Status: DC | PRN
  Administered 2022-10-09: 10 mL

## 2022-10-09 MED ORDER — PROMETHAZINE HCL 25 MG/ML IJ SOLN: 6.2500 mg | INTRAMUSCULAR | Status: DC | PRN

## 2022-10-09 MED ORDER — PNEUMOCOCCAL 20-VAL CONJ VACC 0.5 ML IM SUSY
0.5000 mL | PREFILLED_SYRINGE | INTRAMUSCULAR | Status: AC
  Administered 2022-10-10: 0.5 mL via INTRAMUSCULAR
  Filled 2022-10-09: qty 0.5

## 2022-10-09 MED ORDER — ACETAMINOPHEN 500 MG PO TABS
1000.0000 mg | ORAL_TABLET | Freq: Once | ORAL | Status: AC
  Administered 2022-10-09: 1000 mg via ORAL

## 2022-10-09 MED ORDER — ACETAMINOPHEN 500 MG PO TABS
ORAL_TABLET | ORAL | Status: AC
  Filled 2022-10-09: qty 2

## 2022-10-09 MED ORDER — ACETAMINOPHEN 325 MG PO TABS
650.0000 mg | ORAL_TABLET | Freq: Four times a day (QID) | ORAL | Status: DC | PRN
  Administered 2022-10-10: 650 mg via ORAL
  Filled 2022-10-09: qty 2

## 2022-10-09 MED ORDER — LOSARTAN POTASSIUM 50 MG PO TABS
100.0000 mg | ORAL_TABLET | Freq: Every day | ORAL | Status: DC
  Administered 2022-10-09 – 2022-10-11 (×2): 100 mg via ORAL
  Filled 2022-10-09 (×2): qty 2

## 2022-10-09 MED ORDER — LIDOCAINE HCL (CARDIAC) PF 100 MG/5ML IV SOSY
PREFILLED_SYRINGE | INTRAVENOUS | Status: DC | PRN
  Administered 2022-10-09: 40 mg via INTRAVENOUS

## 2022-10-09 MED ORDER — SODIUM CHLORIDE 0.9 % IR SOLN
Status: DC | PRN
  Administered 2022-10-09: 100 mL via SURGICAL_CAVITY

## 2022-10-09 MED ORDER — OXYCODONE HCL 5 MG PO TABS
ORAL_TABLET | ORAL | Status: AC
  Filled 2022-10-09: qty 1

## 2022-10-09 MED ORDER — ONDANSETRON HCL 4 MG/2ML IJ SOLN
INTRAMUSCULAR | Status: DC | PRN
  Administered 2022-10-09: 4 mg via INTRAVENOUS

## 2022-10-09 MED ORDER — PHENYLEPHRINE HCL-NACL 20-0.9 MG/250ML-% IV SOLN
INTRAVENOUS | Status: AC
  Filled 2022-10-09: qty 250

## 2022-10-09 MED ORDER — FENTANYL CITRATE (PF) 100 MCG/2ML IJ SOLN
INTRAMUSCULAR | Status: DC | PRN
  Administered 2022-10-09 (×4): 25 ug via INTRAVENOUS

## 2022-10-09 MED ORDER — ONDANSETRON HCL 4 MG/2ML IJ SOLN: 4.0000 mg | Freq: Four times a day (QID) | INTRAMUSCULAR | Status: DC | PRN

## 2022-10-09 MED ORDER — HYDRALAZINE HCL 50 MG PO TABS
50.0000 mg | ORAL_TABLET | Freq: Two times a day (BID) | ORAL | Status: DC
  Administered 2022-10-09 – 2022-10-11 (×4): 50 mg via ORAL
  Filled 2022-10-09 (×4): qty 1

## 2022-10-09 MED ORDER — 0.9 % SODIUM CHLORIDE (POUR BTL) OPTIME
TOPICAL | Status: DC | PRN
  Administered 2022-10-09: 500 mL

## 2022-10-09 SURGICAL SUPPLY — 66 items
BAG DECANTER FOR FLEXI CONT (MISCELLANEOUS) ×1 IMPLANT
BLADE SURG SZ11 CARB STEEL (BLADE) ×1 IMPLANT
BNDG COHESIVE 4X5 TAN STRL LF (GAUZE/BANDAGES/DRESSINGS) IMPLANT
BOOT SUTURE VASCULAR YLW (MISCELLANEOUS) ×1
BRUSH SCRUB EZ  4% CHG (MISCELLANEOUS) ×1
BRUSH SCRUB EZ 4% CHG (MISCELLANEOUS) ×1 IMPLANT
CATH FOGERTY 4X80 WAS (CATHETERS) IMPLANT
CHLORAPREP W/TINT 26 (MISCELLANEOUS) ×1 IMPLANT
CLAMP SUTURE YELLOW 5 PAIRS (MISCELLANEOUS) ×1 IMPLANT
CLIP SPRNG 6 S-JAW DBL (CLIP) ×1 IMPLANT
CLIP SPRNG 6MM S-JAW DBL (CLIP) ×1
CUFF TOURN SGL QUICK 12 (TOURNIQUET CUFF) IMPLANT
DERMABOND ADVANCED .7 DNX12 (GAUZE/BANDAGES/DRESSINGS) ×1 IMPLANT
DRSG XEROFORM 1X8 (GAUZE/BANDAGES/DRESSINGS) IMPLANT
ELECT CAUTERY BLADE 6.4 (BLADE) ×1 IMPLANT
ELECT REM PT RETURN 9FT ADLT (ELECTROSURGICAL) ×1
ELECTRODE REM PT RTRN 9FT ADLT (ELECTROSURGICAL) ×1 IMPLANT
GAUZE SPONGE 4X4 12PLY STRL (GAUZE/BANDAGES/DRESSINGS) IMPLANT
GLOVE BIO SURGEON STRL SZ7 (GLOVE) ×2 IMPLANT
GOWN STRL REUS W/ TWL LRG LVL3 (GOWN DISPOSABLE) ×2 IMPLANT
GOWN STRL REUS W/ TWL XL LVL3 (GOWN DISPOSABLE) ×1 IMPLANT
GOWN STRL REUS W/TWL LRG LVL3 (GOWN DISPOSABLE) ×2
GOWN STRL REUS W/TWL XL LVL3 (GOWN DISPOSABLE) ×1
GRAFT COLLAGEN VASCULAR 7X45 (Vascular Products) IMPLANT
HEMOSTAT SURGICEL 2X3 (HEMOSTASIS) ×1 IMPLANT
IV NS 500ML (IV SOLUTION) ×1
IV NS 500ML BAXH (IV SOLUTION) ×1 IMPLANT
KIT TURNOVER KIT A (KITS) ×1 IMPLANT
LABEL OR SOLS (LABEL) ×1 IMPLANT
LOOP VESSEL MAXI  1X406 RED (MISCELLANEOUS) ×2
LOOP VESSEL MAXI 1X406 RED (MISCELLANEOUS) ×1 IMPLANT
LOOP VESSEL MINI 0.8X406 BLUE (MISCELLANEOUS) ×1 IMPLANT
MANIFOLD NEPTUNE II (INSTRUMENTS) ×1 IMPLANT
NDL FILTER BLUNT 18X1 1/2 (NEEDLE) ×1 IMPLANT
NEEDLE FILTER BLUNT 18X1 1/2 (NEEDLE) ×1 IMPLANT
NS IRRIG 500ML POUR BTL (IV SOLUTION) ×1 IMPLANT
PACK EXTREMITY ARMC (MISCELLANEOUS) ×1 IMPLANT
PAD PREP OB/GYN DISP 24X41 (PERSONAL CARE ITEMS) ×1 IMPLANT
SOLUTION CELL SAVER (CLIP) ×1 IMPLANT
SPIKE FLUID TRANSFER (MISCELLANEOUS) ×1 IMPLANT
SPONGE T-LAP 18X18 ~~LOC~~+RFID (SPONGE) IMPLANT
STOCKINETTE 48X4 2 PLY STRL (GAUZE/BANDAGES/DRESSINGS) ×1 IMPLANT
STOCKINETTE IMPERV 14X48 (MISCELLANEOUS) IMPLANT
STOCKINETTE STRL 4IN 9604848 (GAUZE/BANDAGES/DRESSINGS) ×1 IMPLANT
SURGIFLO W/THROMBIN 8M KIT (HEMOSTASIS) IMPLANT
SUT ETHILON 3-0 (SUTURE) IMPLANT
SUT GORETEX 6.0 TT9 (SUTURE) ×1 IMPLANT
SUT MNCRL AB 4-0 PS2 18 (SUTURE) ×1 IMPLANT
SUT PROLENE 5 0 RB 1 DA (SUTURE) IMPLANT
SUT PROLENE 5 0 RB 2 (SUTURE) IMPLANT
SUT PROLENE 6 0 BV (SUTURE) ×4 IMPLANT
SUT SILK 0 SH 30 (SUTURE) ×1 IMPLANT
SUT SILK 2 0 (SUTURE) ×1
SUT SILK 2-0 18XBRD TIE 12 (SUTURE) ×1 IMPLANT
SUT SILK 3 0 (SUTURE) ×1
SUT SILK 3-0 18XBRD TIE 12 (SUTURE) ×1 IMPLANT
SUT SILK 4 0 (SUTURE) ×1
SUT SILK 4-0 18XBRD TIE 12 (SUTURE) ×1 IMPLANT
SUT VIC AB 3-0 SH 27 (SUTURE) ×1
SUT VIC AB 3-0 SH 27X BRD (SUTURE) ×1 IMPLANT
SYR 20ML LL LF (SYRINGE) ×1 IMPLANT
SYR 3ML LL SCALE MARK (SYRINGE) ×1 IMPLANT
SYR 5ML LL (SYRINGE) IMPLANT
TAG SUTURE CLAMP YLW 5PR (MISCELLANEOUS) ×1
TRAP FLUID SMOKE EVACUATOR (MISCELLANEOUS) ×1 IMPLANT
WATER STERILE IRR 500ML POUR (IV SOLUTION) ×1 IMPLANT

## 2022-10-09 NOTE — Anesthesia Preprocedure Evaluation (Addendum)
Anesthesia Evaluation  Patient identified by MRN, date of birth, ID band Patient awake    Reviewed: Allergy & Precautions, NPO status , Patient's Chart, lab work & pertinent test results  Airway Mallampati: III  TM Distance: >3 FB Neck ROM: full    Dental  (+) Chipped   Pulmonary former smoker TB lung, latent   Pulmonary exam normal        Cardiovascular Exercise Tolerance: Good hypertension, pulmonary hypertension+ CAD   Rhythm:Regular Rate:Normal + Systolic murmurs- Peripheral Edema ECHO 11/2021: Summary   1. The left ventricle is normal in size with normal wall thickness.    2. The left ventricular systolic function is normal, LVEF is visually  estimated at > 55%.    3. There is grade II diastolic dysfunction (elevated filling pressure).    4. The right ventricle is normal in size, with normal systolic function.    5. There is moderate pulmonary hypertension.     Neuro/Psych negative neurological ROS  negative psych ROS   GI/Hepatic Neg liver ROS,GERD  Controlled,,  Endo/Other  Hypothyroidism    Renal/GU ESRF and DialysisRenal disease     Musculoskeletal   Abdominal   Peds  Hematology  (+) Blood dyscrasia, anemia thrombocytopenia   Anesthesia Other Findings Bleeding graft  K 2.9  Past Medical History: No date: Anemia No date: Chronic kidney disease No date: Dialysis patient (HCC)     Comment:  M-W-F No date: GERD (gastroesophageal reflux disease) No date: Gout No date: Hernia No date: Hyperlipidemia No date: Hypertension No date: Hypothyroidism No date: Moderate pulmonary hypertension (HCC) No date: TB lung, latent No date: Thyroid disease  Past Surgical History: 02/01/2022: A/V FISTULAGRAM; N/A     Comment:  Procedure: A/V Fistulagram;  Surgeon: Renford Dills, MD;  Location: [facility];  Service:               Cardiovascular;  Laterality: N/A; 01/18/2017: AV  FISTULA PLACEMENT; Left     Comment:  Procedure: ARTERIOVENOUS (AV) FISTULA CREATION (               BRACHIAL CEPHALIC );  Surgeon: Renford Dills, MD;                Location: [facility];  Service: Vascular;  Laterality:               Left; 04/30/2013: BREAST BIOPSY; Right     Comment:  intraductal papilloma 04/30/2013: BREAST BIOPSY; Right     Comment:  cyst aspiration 05/27/2013: BREAST BIOPSY; Right     Comment:  subareolar duct excision 05/27/2013: BREAST BIOPSY; Left     Comment:  subareolar duct excision 08/11/2016: BREAST BIOPSY; Left     Comment:  path pending 06/28/2018: COLONOSCOPY 02/01/2022: DIALYSIS/PERMA CATHETER INSERTION; N/A     Comment:  Procedure: DIALYSIS/PERMA CATHETER INSERTION;  Surgeon:               Renford Dills, MD;  Location: [facility];               Service: Cardiovascular;  Laterality: N/A; 1960's: HERNIA REPAIR No date: PARTIAL HYSTERECTOMY     Comment:  1980's 04/18/2017: PERIPHERAL VASCULAR THROMBECTOMY; Left     Comment:  Procedure: PERIPHERAL VASCULAR THROMBECTOMY;  Surgeon:               Renford Dills, MD;  Location: [facility]  INVASIVE CV LAB;               Service: Cardiovascular;  Laterality: Left;  BMI    Body Mass Index: 27.40 kg/m      Reproductive/Obstetrics negative OB ROS                              Anesthesia Physical Anesthesia Plan  ASA: 3  Anesthesia Plan: General   Post-op Pain Management: Tylenol PO (pre-op)* and Regional block*   Induction: Intravenous  PONV Risk Score and Plan: Ondansetron, Dexamethasone and Midazolam  Airway Management Planned: LMA  Additional Equipment:   Intra-op Plan:   Post-operative Plan: Extubation in OR  Informed Consent: I have reviewed the patients History and Physical, chart, labs and discussed the procedure including the risks, benefits and alternatives for the proposed anesthesia with the patient or authorized representative who has  indicated his/her understanding and acceptance.     Dental advisory given  Plan Discussed with: Anesthesiologist, CRNA and Surgeon  Anesthesia Plan Comments:          Anesthesia Quick Evaluation

## 2022-10-09 NOTE — Assessment & Plan Note (Signed)
No active chest pain Hold antiplatelet/anticoagulation in the setting of bleeding graft

## 2022-10-09 NOTE — Assessment & Plan Note (Signed)
SSI  A1C  

## 2022-10-09 NOTE — Op Note (Signed)
    Patient name: Felicia Butler MRN: [id number] DOB: 05/01/1961 Sex: female  10/09/2022 Pre-operative Diagnosis: bleeding left arm graft Post-operative diagnosis:  Same Surgeon:  Durene Cal Procedure:   Revision of left arm graft with interposition Artegraft Anesthesia:  General Blood Loss:  150 cc Specimens:  none  Findings:  anterior wall of graft was blown out and could not be repaired primarilly and so a interposition 6mm Artegraft was placed  Indications:  62 year old female who presented to the ER with bleeding from her left arm dialysis graft. The skin was ulcerated and so operative intervention was indicated  Procedure:  The patient was identified in the holding area and taken to [facility] OR ROOM 07  The patient was then placed supine on the table. general anesthesia was administered.  The patient was prepped and draped in the usual sterile fashion.  A time out was called and antibiotics were administered.  I  began by making an elliptical incision around the ulcerated skin.  Once I exposed the graft, there was a large amount of thrombus over top of the anterior wall.  I then got circumferential proximal and distal control.  I then removed the skin over top of the graft.  The anterior wall of the graft was disintegrated.  I did not feel that it could be repaired primarily.  I then transected the graft for a distance of approximately 3 cm.  The graft was not mobile enough to be primarily repaired and so I selected a 6 mm Artegraft to place as an interposition graft.  Both ends were sewn into place with running 6-0 Prolene.  Once I remove the clamps, there was thrombus within the graft and so I ran a #4 Fogarty catheter proximally and distally and evacuated the acute thrombus and establish excellent inflow and backbleeding.  I then closed the anastomosis.  There was excellent flow within the graft.  Hemostasis was achieved.  I then closed the subcutaneous tissue over top of the graft with running  3-0 Vicryl.  The skin was closed with a running 3-0 nylon.  Sterile dressings were applied.   Disposition: To PACU stable   V. Durene Cal, M.D., [facility] [place] Office: [phone] Pager:  [phone]

## 2022-10-09 NOTE — ED Notes (Addendum)
First nurse note:  Pt to ED via [facility] c/o bleeding at fistula site. Pt was in shower when fistula started bleeding. Bleeding under control at this time, wrapped by EMS with gauze. Hx of htn  195/96 89HR 96%

## 2022-10-09 NOTE — ED Triage Notes (Signed)
Pt states this AM around 6am she started bleeding from her graft on her left arm from a known aneurism. Pt states she used to have a fistula in the left arm, but it was removed due to the aneurisms.  Pt denies pain and bleeding controlled from bandage EMS placed.

## 2022-10-09 NOTE — Assessment & Plan Note (Signed)
Cont synthroid 

## 2022-10-09 NOTE — ED Provider Notes (Signed)
[facility] Provider Note    Event Date/Time   First MD Initiated Contact with Patient 10/09/22 [phone]     (approximate)   History   Chief Complaint Arm Injury (Left Graft)   HPI  Felicia Butler is a 62 y.o. female with past medical history of hypertension, diabetes, CAD, FSGS, and ESRD on HD (MWF) who presents to the ED complaining of dialysis access problem.  Patient reports that she was taking a bath this morning when she suddenly developed bleeding from the area of her left upper extremity AV graft.  She reports dealing with aneurysms in this area in the past, had a prior AV fistula replaced with a graft at the end of last year.  She states this is the first time she has dealt with bleeding, does not take any blood thinners.  She denies any pain in the distal part of her arm.  Left upper arm and area of bleeding was wrapped with gauze by EMS prior to arrival, patient has noticed minimal bleeding since then.     Physical Exam   Triage Vital Signs: ED Triage Vitals  Enc Vitals Group     BP 10/09/22 0706 (!) 177/78     Pulse Rate 10/09/22 0706 82     Resp 10/09/22 0706 18     Temp 10/09/22 0706 97.6 F (36.4 C)     Temp Source 10/09/22 0706 Oral     SpO2 10/09/22 0706 95 %     Weight 10/09/22 0720 139 lb (63 kg)     Height 10/09/22 0720 5\' 1"  (1.549 m)     Head Circumference --      Peak Flow --      Pain Score 10/09/22 0720 0     Pain Loc --      Pain Edu? --      Excl. in GC? --     Most recent vital signs: Vitals:   10/09/22 0706 10/09/22 1038  BP: (!) 177/78 (!) 174/78  Pulse: 82 80  Resp: 18 18  Temp: 97.6 F (36.4 C) 97.7 F (36.5 C)  SpO2: 95% 95%    Constitutional: Alert and oriented. Eyes: Conjunctivae are normal. Head: Atraumatic. Nose: No congestion/rhinnorhea. Mouth/Throat: Mucous membranes are moist.  Cardiovascular: Normal rate, regular rhythm. Grossly normal heart sounds.  2+ radial pulses bilaterally.  Left upper  extremity AV graft site with brisk bleeding from lower portion with removal of gauze. Respiratory: Normal respiratory effort.  No retractions. Lungs CTAB. Gastrointestinal: Soft and nontender. No distention. Musculoskeletal: No lower extremity tenderness nor edema.  Neurologic:  Normal speech and language. No gross focal neurologic deficits are appreciated.    ED Results / Procedures / Treatments   Labs (all labs ordered are listed, but only abnormal results are displayed) Labs Reviewed  CBC - Abnormal; Notable for the following components:      Result Value   RBC 3.64 (*)    Hemoglobin 10.3 (*)    HCT 31.6 (*)    RDW 16.9 (*)    Platelets 94 (*)    All other components within normal limits  COMPREHENSIVE METABOLIC PANEL - Abnormal; Notable for the following components:   Potassium 2.9 (*)    Chloride 96 (*)    BUN 37 (*)    Creatinine, Ser 7.73 (*)    Calcium 8.4 (*)    Alkaline Phosphatase 182 (*)    Total Bilirubin 1.3 (*)    GFR, Estimated 5 (*)  All other components within normal limits  TYPE AND SCREEN    PROCEDURES:  Critical Care performed: No  Procedures   MEDICATIONS ORDERED IN ED: Medications - No data to display   IMPRESSION / MDM / ASSESSMENT AND PLAN / ED COURSE  I reviewed the triage vital signs and the nursing notes.                              62 y.o. female with a past medical history of hypertension, diabetes, CAD, FSGS, and ESRD on HD (MWF) who presents to the ED complaining of bleeding from her AV graft site starting earlier this morning while she was taking a bath.  Patient's presentation is most consistent with acute presentation with potential threat to life or bodily function.  Differential diagnosis includes, but is not limited to, bleeding dialysis access, anemia, electrolyte abnormality.  Patient nontoxic-appearing and in no acute distress, vital signs are unremarkable.  She remains neurovascular intact to her distal left upper  extremity with strong radial pulse, but with removal of gauze wrap put in place by EMS, she has ongoing brisk bleeding from the access site.  Bleeding controlled with reapplication of gauze and arm clamp, plan to discuss intervention with vascular surgery.  Labs remarkable for mild downtrending hemoglobin compared to previous along with thrombocytopenia.  Patient also with some hypokalemia, but would hold off on potassium administration given her ESRD.  Patient evaluated by Dr. Myra Gianotti of vascular surgery, who will plan to take her to the OR for intervention.  He agrees that platelet transfusion not needed.  Patient remains comfortable with no bleeding as long as clamp remains in place.  Dr. Myra Gianotti recommends admission to the hospitalist service for fistulogram in the morning.  Case discussed with hospitalist for admission.      FINAL CLINICAL IMPRESSION(S) / ED DIAGNOSES   Final diagnoses:  Hemorrhage associated with renal dialysis graft (HCC)  ESRD on hemodialysis (HCC)     Rx / DC Orders   ED Discharge Orders     None        Note:  This document was prepared using Dragon voice recognition software and may include unintentional dictation errors.   Chesley Noon, MD 10/09/22 1147

## 2022-10-09 NOTE — Transfer of Care (Signed)
Immediate Anesthesia Transfer of Care Note  Patient: Felicia Butler  Procedure(s) Performed: REVISION OF ARTERIOVENOUS FISTULA (Left: Arm Upper)  Patient Location: PACU  Anesthesia Type:General  Level of Consciousness: drowsy  Airway & Oxygen Therapy: Patient Spontanous Breathing  Post-op Assessment: Report given to RN and Post -op Vital signs reviewed and stable  Post vital signs: Reviewed and stable  Last Vitals:  Vitals Value Taken Time  BP 168/83   Temp    Pulse 61   Resp 22   SpO2 97     Last Pain:  Vitals:   10/09/22 1513  TempSrc: Oral  PainSc:          Complications: No notable events documented.

## 2022-10-09 NOTE — ED Notes (Signed)
Advised nurse that patient has ready bed 

## 2022-10-09 NOTE — Anesthesia Postprocedure Evaluation (Signed)
Anesthesia Post Note  Patient: Felicia Butler  Procedure(s) Performed: REVISION OF ARTERIOVENOUS FISTULA (Left: Arm Upper)  Patient location during evaluation: PACU Anesthesia Type: General Level of consciousness: awake and alert Pain management: pain level controlled Vital Signs Assessment: post-procedure vital signs reviewed and stable Respiratory status: spontaneous breathing, nonlabored ventilation and respiratory function stable Cardiovascular status: blood pressure returned to baseline and stable Postop Assessment: no apparent nausea or vomiting Anesthetic complications: no   No notable events documented.   Last Vitals:  Vitals:   10/09/22 2056 10/09/22 2157  BP: (!) 146/78 (!) 159/75  Pulse: 63 (!) 57  Resp: 20 20  Temp: (!) 36.4 C (!) 36.4 C  SpO2: 97% 100%    Last Pain:  Vitals:   10/09/22 2157  TempSrc: Oral  PainSc:                  Foye Deer

## 2022-10-09 NOTE — Anesthesia Procedure Notes (Signed)
Procedure Name: LMA Insertion Date/Time: 10/09/2022 5:15 PM  Performed by: Jaye Beagle, CRNAPre-anesthesia Checklist: Patient identified, Emergency Drugs available, Suction available and Patient being monitored Patient Re-evaluated:Patient Re-evaluated prior to induction Oxygen Delivery Method: Circle system utilized Preoxygenation: Pre-oxygenation with 100% oxygen Induction Type: IV induction Ventilation: Mask ventilation without difficulty LMA: LMA inserted LMA Size: 3.0 Number of attempts: 1 Placement Confirmation: positive ETCO2 and breath sounds checked- equal and bilateral Tube secured with: Tape Dental Injury: Teeth and Oropharynx as per pre-operative assessment

## 2022-10-09 NOTE — Assessment & Plan Note (Signed)
Noted bleeding of left upper extremity hemodialysis graft Pending procedure correction by vascular surgery today as well as fistulogram tomorrow Will make n.p.o. Hold antiplatelet anticoagulation

## 2022-10-09 NOTE — Assessment & Plan Note (Signed)
Cont statin

## 2022-10-09 NOTE — H&P (Signed)
History and Physical    Patient: Felicia Butler ZOX:[id number] DOB: [birth year] DOA: 10/09/2022 DOS: the patient was seen and examined on 10/09/2022 PCP: Resa Miner, MD  Patient coming from: Home  Chief Complaint:  Chief Complaint  Patient presents with   Arm Injury    Left Graft   HPI: Felicia Butler is a 62 y.o. female with medical history significant of end-stage renal disease on hemodialysis Monday Wednesday Friday, anemia of chronic disease, GERD, gout, hypertension, hyperlipidemia, hypothyroidism, pulmonary hypertension presenting with left graft bleeding.  Patient reports having bleeding from her left upper extremity graft since earlier today.  Patient denies any known trauma.  Reports having some issue of bulging over the past 1 to 2 weeks.  Has been followed by outpatient vascular surgery.  Reports getting hemodialysis through the graft without any significant issues up-to-date.  No fevers or chills.  No nausea or vomiting.  No chest pain or shortness of breath.  No recent anticoagulation use. Presented to the ER afebrile, hemodynamically stable.  Satting well on room air.  White count 4.9, 10.3, platelet count 94.  Creatinine 7.73, potassium 2.9, alk phos 182, T. bili 1.3.  Patient evaluated by vascular surgery at the bedside by Dr. Myra Gianotti.  Will plan for procedural intervention today as well as fistulogram tomorrow. Review of Systems: As mentioned in the history of present illness. All other systems reviewed and are negative. Past Medical History:  Diagnosis Date   Anemia    Chronic kidney disease    Dialysis patient [facility])    M-W-F   GERD (gastroesophageal reflux disease)    Gout    Hernia    Hyperlipidemia    Hypertension    Hypothyroidism    Moderate pulmonary hypertension (HCC)    TB lung, latent    Thyroid disease    Past Surgical History:  Procedure Laterality Date   A/V FISTULAGRAM N/A 02/01/2022   Procedure: A/V Fistulagram;  Surgeon: Renford Dills,  MD;  Location: [facility];  Service: Cardiovascular;  Laterality: N/A;   AV FISTULA PLACEMENT Left 01/18/2017   Procedure: ARTERIOVENOUS (AV) FISTULA CREATION ( BRACHIAL CEPHALIC );  Surgeon: Renford Dills, MD;  Location: [facility] ORS;  Service: Vascular;  Laterality: Left;   BREAST BIOPSY Right 04/30/2013   intraductal papilloma   BREAST BIOPSY Right 04/30/2013   cyst aspiration   BREAST BIOPSY Right 05/27/2013   subareolar duct excision   BREAST BIOPSY Left 05/27/2013   subareolar duct excision   BREAST BIOPSY Left 08/11/2016   path pending   COLONOSCOPY  06/28/2018   DIALYSIS/PERMA CATHETER INSERTION N/A 02/01/2022   Procedure: DIALYSIS/PERMA CATHETER INSERTION;  Surgeon: Renford Dills, MD;  Location: [facility];  Service: Cardiovascular;  Laterality: N/A;   DIALYSIS/PERMA CATHETER REMOVAL N/A 07/28/2022   Procedure: DIALYSIS/PERMA CATHETER REMOVAL;  Surgeon: Annice Needy, MD;  Location: [facility];  Service: Cardiovascular;  Laterality: N/A;   HERNIA REPAIR  1960's   PARTIAL HYSTERECTOMY     1980's   PERIPHERAL VASCULAR THROMBECTOMY Left 04/18/2017   Procedure: PERIPHERAL VASCULAR THROMBECTOMY;  Surgeon: Renford Dills, MD;  Location: [facility];  Service: Cardiovascular;  Laterality: Left;   REVISON OF ARTERIOVENOUS FISTULA Left 05/06/2022   Procedure: REVISON OF ARTERIOVENOUS FISTULA (BRACHIAL CEPHALIC);  Surgeon: Renford Dills, MD;  Location: [facility] ORS;  Service: Vascular;  Laterality: Left;   Social History:  reports that she quit smoking about 39 years ago. Her smoking use  included cigarettes. She has never used smokeless tobacco. She reports that she does not drink alcohol and does not use drugs.  No Known Allergies  Family History  Problem Relation Age of Onset   Cancer Mother    Diabetes Father    Stroke Father     Prior to Admission medications   Medication Sig Start Date End Date Taking? Authorizing Provider   acetaminophen (TYLENOL) 500 MG tablet Take 1,000 mg by mouth every 6 (six) hours as needed.   Yes [provider]  amLODipine (NORVASC) 10 MG tablet Take 10 mg by mouth daily.   Yes [provider]  atorvastatin (LIPITOR) 80 MG tablet Take 80 mg by mouth at bedtime. 01/29/20  Yes [provider]  AURYXIA 1 GM 210 MG(Fe) tablet Take 630 mg by mouth 3 (three) times daily. 02/10/20  Yes [provider]  cinacalcet (SENSIPAR) 30 MG tablet Take 30 mg by mouth daily. 02/10/20  Yes [provider]  famotidine (PEPCID) 20 MG tablet Take 20 mg by mouth 2 (two) times daily. 01/30/20  Yes [provider]  hydrALAZINE (APRESOLINE) 50 MG tablet Take 50 mg by mouth 3 (three) times daily.   Yes [provider]  levothyroxine (SYNTHROID, LEVOTHROID) 75 MCG tablet Take 75 mcg by mouth daily before breakfast.   Yes [provider]  losartan (COZAAR) 100 MG tablet Take 100 mg by mouth daily.   Yes [provider]  Methoxy PEG-Epoetin Beta (MIRCERA IJ) Mircera 08/19/22 08/18/23  [provider]    Physical Exam: Vitals:   10/09/22 0706 10/09/22 0720 10/09/22 1038  BP: (!) 177/78  (!) 174/78  Pulse: 82  80  Resp: 18  18  Temp: 97.6 F (36.4 C)  97.7 F (36.5 C)  TempSrc: Oral  Oral  SpO2: 95%  95%  Weight:  63 kg   Height:  5\' 1"  (1.549 m)    Physical Exam Constitutional:      Appearance: She is normal weight.  HENT:     Head: Normocephalic and atraumatic.     Nose: Nose normal.     Mouth/Throat:     Mouth: Mucous membranes are moist.  Eyes:     Pupils: Pupils are equal, round, and reactive to light.  Cardiovascular:     Rate and Rhythm: Normal rate and regular rhythm.  Pulmonary:     Effort: Pulmonary effort is normal.  Abdominal:     General: Abdomen is flat.  Musculoskeletal:     Comments: Positive left upper extremity bleeding fistula with clamp in place  Skin:    General: Skin is warm.  Neurological:      General: No focal deficit present.  Psychiatric:        Mood and Affect: Mood normal.     Data Reviewed:  There are no new results to review at this time. Lab Results  Component Value Date   WBC 4.9 10/09/2022   HGB 10.3 (L) 10/09/2022   HCT 31.6 (L) 10/09/2022   MCV 86.8 10/09/2022   PLT 94 (L) 10/09/2022   Last metabolic panel Lab Results  Component Value Date   GLUCOSE 95 10/09/2022   NA 138 10/09/2022   K 2.9 (L) 10/09/2022   CL 96 (L) 10/09/2022   CO2 27 10/09/2022   BUN 37 (H) 10/09/2022   CREATININE 7.73 (H) 10/09/2022   GFRNONAA 5 (L) 10/09/2022   CALCIUM 8.4 (L) 10/09/2022   PROT 7.5 10/09/2022   ALBUMIN 3.7 10/09/2022  BILITOT 1.3 (H) 10/09/2022   ALKPHOS 182 (H) 10/09/2022   AST 29 10/09/2022   ALT 18 10/09/2022   ANIONGAP 15 10/09/2022    Assessment and Plan: * Complication of vascular graft Noted bleeding of left upper extremity hemodialysis graft Pending procedure correction by vascular surgery today as well as fistulogram tomorrow Will make n.p.o. Hold antiplatelet anticoagulation   Hypertension BP stable Titrate home regimen  CAD in native artery No active chest pain Hold antiplatelet/anticoagulation in the setting of bleeding graft  Type 2 diabetes mellitus with diabetic chronic kidney disease (HCC) SSI  A1C  Hypothyroidism Cont synthroid   Hyperlipidemia Cont statin    Gout Cont allopurinol   End stage renal disease (HCC) Baseline end-stage renal disease on hemodialysis Monday Wednesday Friday Will be due for dialysis on 5-13 Nephrology contacted  Anemia in chronic kidney disease Hgb 10.3  Appears near baseline       Advance Care Planning:   Code Status: Full Code   Consults: Vascular surgery w/ Dr. Myra Gianotti  Family Communication: Family at the bedside   Severity of Illness: The appropriate patient status for this patient is INPATIENT. Inpatient status is judged to be reasonable and necessary in order to provide  the required intensity of service to ensure the patient's safety. The patient's presenting symptoms, physical exam findings, and initial radiographic and laboratory data in the context of their chronic comorbidities is felt to place them at high risk for further clinical deterioration. Furthermore, it is not anticipated that the patient will be medically stable for discharge from the hospital within 2 midnights of admission.   * I certify that at the point of admission it is my clinical judgment that the patient will require inpatient hospital care spanning beyond 2 midnights from the point of admission due to high intensity of service, high risk for further deterioration and high frequency of surveillance required.*  Author: Floydene Flock, MD 10/09/2022 12:45 PM  For on call review [email or web address].

## 2022-10-09 NOTE — Consult Note (Signed)
Vascular and Vein Specialist of [facility]  Patient name: Felicia Butler MRN: [id number] DOB: 13-Aug-1960 Sex: female   REQUESTING PROVIDER:    ER   REASON FOR CONSULT:    Bleeding from dialysis access  HISTORY OF PRESENT ILLNESS:   Felicia Butler is a 62 y.o. female, who presented to the ER today after picking at a scab over top of her fistula and it started bleeding.  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  She says she has been having high flow rates with dialysis and has been scheduled for a fistulogram in a week or so.  PAST MEDICAL HISTORY    Past Medical History:  Diagnosis Date   Anemia    Chronic kidney disease    Dialysis patient (HCC)    M-W-F   GERD (gastroesophageal reflux disease)    Gout    Hernia    Hyperlipidemia    Hypertension    Hypothyroidism    Moderate pulmonary hypertension (HCC)    TB lung, latent    Thyroid disease      FAMILY HISTORY   Family History  Problem Relation Age of Onset   Cancer Mother    Diabetes Father    Stroke Father     SOCIAL HISTORY:   Social History   Socioeconomic History   Marital status: Divorced    Spouse name: Not on file   Number of children: 1   Years of education: Not on file   Highest education level: Not on file  Occupational History   Not on file  Tobacco Use   Smoking status: Former    Years: 5    Types: Cigarettes    Quit date: 05/31/1983    Years since quitting: 39.3   Smokeless tobacco: Never  Vaping Use   Vaping Use: Never used  Substance and Sexual Activity   Alcohol use: No   Drug use: No   Sexual activity: Not on file  Other Topics Concern   Not on file  Social History Narrative   Lives with sister and son   Social Determinants of Health   Financial Resource Strain: Not on file  Food Insecurity: Not on file  Transportation Needs: Not on file  Physical Activity:  Not on file  Stress: Not on file  Social Connections: Not on file  Intimate Partner Violence: Not on file    ALLERGIES:    No Known Allergies  CURRENT MEDICATIONS:    No current facility-administered medications for this encounter.   Current Outpatient Medications  Medication Sig Dispense Refill   acetaminophen (TYLENOL) 500 MG tablet Take 1,000 mg by mouth every 6 (six) hours as needed.     amLODipine (NORVASC) 10 MG tablet Take 10 mg by mouth daily.     atorvastatin (LIPITOR) 80 MG tablet Take 80 mg by mouth at bedtime.     AURYXIA 1 GM 210 MG(Fe) tablet Take 630 mg by mouth 3 (three) times daily.     cinacalcet (SENSIPAR) 30 MG tablet Take 30 mg by mouth daily.     famotidine (PEPCID) 20 MG tablet Take 20 mg by mouth 2 (two) times daily.     hydrALAZINE (APRESOLINE) 50 MG tablet Take 50 mg by mouth 2 (two) times daily.     levothyroxine (SYNTHROID, LEVOTHROID) 75 MCG tablet Take 75 mcg by mouth daily before breakfast.     losartan (COZAAR) 100 MG tablet Take 100 mg by  mouth daily.     Methoxy PEG-Epoetin Beta (MIRCERA IJ) Mircera      REVIEW OF SYSTEMS:   [X]  denotes positive finding, [ ]  denotes negative finding Cardiac  Comments:  Chest pain or chest pressure:    Shortness of breath upon exertion:    Short of breath when lying flat:    Irregular heart rhythm:        Vascular    Pain in calf, thigh, or hip brought on by ambulation:    Pain in feet at night that wakes you up from your sleep:     Blood clot in your veins:    Leg swelling:         Pulmonary    Oxygen at home:    Productive cough:     Wheezing:         Neurologic    Sudden weakness in arms or legs:     Sudden numbness in arms or legs:     Sudden onset of difficulty speaking or slurred speech:    Temporary loss of vision in one eye:     Problems with dizziness:         Gastrointestinal    Blood in stool:      Vomited blood:         Genitourinary    Burning when urinating:     Blood in  urine:        Psychiatric    Major depression:         Hematologic    Bleeding problems:    Problems with blood clotting too easily:        Skin    Rashes or ulcers:        Constitutional    Fever or chills:     PHYSICAL EXAM:   Vitals:   10/09/22 0706 10/09/22 0720 10/09/22 1038  BP: (!) 177/78  (!) 174/78  Pulse: 82  80  Resp: 18  18  Temp: 97.6 F (36.4 C)  97.7 F (36.5 C)  TempSrc: Oral  Oral  SpO2: 95%  95%  Weight:  63 kg   Height:  5\' 1"  (1.549 m)     GENERAL: The patient is a well-nourished female, in no acute distress. The vital signs are documented above. CARDIAC: There is a regular rate and rhythm.  VASCULAR: there is a 2mm ulcerated area over top of her fistula that is the source of bleding NEUROLOGIC: No focal weakness or paresthesias are detected. SKIN: There are no ulcers or rashes noted. PSYCHIATRIC: The patient has a normal affect.  STUDIES:   none  ASSESSMENT and PLAN   Bleeding from fistula:  I discussed that she needs to goto the OR for repair fo the ulcer overtop of her fistula.  She will also need a fistulogram to evaluate for central stenosis.  I will repair the fistula today and set her up for a fistulogram tomorrow.     Charlena Cross, MD, FACS [facility] of [facility] [phone] Pager [phone]

## 2022-10-09 NOTE — Assessment & Plan Note (Signed)
Baseline end-stage renal disease on hemodialysis Monday Wednesday Friday Will be due for dialysis on 5-13 Nephrology contacted

## 2022-10-09 NOTE — Assessment & Plan Note (Signed)
Hgb 10.3  Appears near baseline

## 2022-10-09 NOTE — Assessment & Plan Note (Signed)
BP stable Titrate home regimen 

## 2022-10-09 NOTE — Assessment & Plan Note (Signed)
Cont allopurinol 

## 2022-10-09 NOTE — Progress Notes (Signed)
Referring Provider: No ref. provider found Primary Care Physician:  Resa Miner, MD Primary Nephrologist:  Dr.   Jaquita Rector for Consultation: ESRD  HPI: 62 year old female with history of hypertension, coronary artery disease, congestive heart failure, end-stage renal disease on dialysis now sent to the emergency room by vascular for bleeding from the AV graft site.  Patient is stable dialysis yesterday and she is on a Monday Wednesday Friday schedule.  Past Medical History:  Diagnosis Date   Anemia    Chronic kidney disease    Dialysis patient [facility])    M-W-F   GERD (gastroesophageal reflux disease)    Gout    Hernia    Hyperlipidemia    Hypertension    Hypothyroidism    Moderate pulmonary hypertension (HCC)    TB lung, latent    Thyroid disease     Past Surgical History:  Procedure Laterality Date   A/V FISTULAGRAM N/A 02/01/2022   Procedure: A/V Fistulagram;  Surgeon: Renford Dills, MD;  Location: [facility];  Service: Cardiovascular;  Laterality: N/A;   AV FISTULA PLACEMENT Left 01/18/2017   Procedure: ARTERIOVENOUS (AV) FISTULA CREATION ( BRACHIAL CEPHALIC );  Surgeon: Renford Dills, MD;  Location: [facility] ORS;  Service: Vascular;  Laterality: Left;   BREAST BIOPSY Right 04/30/2013   intraductal papilloma   BREAST BIOPSY Right 04/30/2013   cyst aspiration   BREAST BIOPSY Right 05/27/2013   subareolar duct excision   BREAST BIOPSY Left 05/27/2013   subareolar duct excision   BREAST BIOPSY Left 08/11/2016   path pending   COLONOSCOPY  06/28/2018   DIALYSIS/PERMA CATHETER INSERTION N/A 02/01/2022   Procedure: DIALYSIS/PERMA CATHETER INSERTION;  Surgeon: Renford Dills, MD;  Location: [facility];  Service: Cardiovascular;  Laterality: N/A;   DIALYSIS/PERMA CATHETER REMOVAL N/A 07/28/2022   Procedure: DIALYSIS/PERMA CATHETER REMOVAL;  Surgeon: Annice Needy, MD;  Location: [facility];  Service: Cardiovascular;  Laterality: N/A;    HERNIA REPAIR  1960's   PARTIAL HYSTERECTOMY     1980's   PERIPHERAL VASCULAR THROMBECTOMY Left 04/18/2017   Procedure: PERIPHERAL VASCULAR THROMBECTOMY;  Surgeon: Renford Dills, MD;  Location: [facility];  Service: Cardiovascular;  Laterality: Left;   REVISON OF ARTERIOVENOUS FISTULA Left 05/06/2022   Procedure: REVISON OF ARTERIOVENOUS FISTULA (BRACHIAL CEPHALIC);  Surgeon: Renford Dills, MD;  Location: [facility] ORS;  Service: Vascular;  Laterality: Left;    Prior to Admission medications   Medication Sig Start Date End Date Taking? Authorizing Provider  acetaminophen (TYLENOL) 500 MG tablet Take 1,000 mg by mouth every 6 (six) hours as needed.   Yes [provider]  amLODipine (NORVASC) 10 MG tablet Take 10 mg by mouth daily.   Yes [provider]  atorvastatin (LIPITOR) 80 MG tablet Take 80 mg by mouth at bedtime. 01/29/20  Yes [provider]  AURYXIA 1 GM 210 MG(Fe) tablet Take 630 mg by mouth 3 (three) times daily. 02/10/20  Yes [provider]  cinacalcet (SENSIPAR) 30 MG tablet Take 30 mg by mouth daily. 02/10/20  Yes [provider]  famotidine (PEPCID) 20 MG tablet Take 20 mg by mouth 2 (two) times daily. 01/30/20  Yes [provider]  hydrALAZINE (APRESOLINE) 50 MG tablet Take 50 mg by mouth 3 (three) times daily.   Yes [provider]  levothyroxine (SYNTHROID, LEVOTHROID) 75 MCG tablet Take 75 mcg by mouth daily before breakfast.   Yes [provider]  losartan (COZAAR)  100 MG tablet Take 100 mg by mouth daily.   Yes [provider]  Methoxy PEG-Epoetin Beta (MIRCERA IJ) Mircera 08/19/22 08/18/23  [provider]    Current Facility-Administered Medications  Medication Dose Route Frequency Provider Last Rate Last Admin   acetaminophen (TYLENOL) tablet 650 mg  650 mg Oral Q6H PRN Floydene Flock, MD       amLODipine (NORVASC) tablet 10 mg  10 mg Oral Daily Floydene Flock, MD   10  mg at 10/09/22 1436   atorvastatin (LIPITOR) tablet 80 mg  80 mg Oral QHS Floydene Flock, MD       cinacalcet [facility]) tablet 30 mg  30 mg Oral Daily Floydene Flock, MD       ferric citrate (AURYXIA) tablet 630 mg  630 mg Oral TID Floydene Flock, MD       hydrALAZINE (APRESOLINE) tablet 50 mg  50 mg Oral BID Floydene Flock, MD   50 mg at 10/09/22 1435   [START ON 10/10/2022] levothyroxine (SYNTHROID) tablet 75 mcg  75 mcg Oral QAC breakfast Floydene Flock, MD       losartan (COZAAR) tablet 100 mg  100 mg Oral Daily Floydene Flock, MD   100 mg at 10/09/22 1435   ondansetron (ZOFRAN) tablet 4 mg  4 mg Oral Q6H PRN Floydene Flock, MD       Or   ondansetron [facility]) injection 4 mg  4 mg Intravenous Q6H PRN Floydene Flock, MD        Allergies as of 10/09/2022   (No Known Allergies)    Family History  Problem Relation Age of Onset   Cancer Mother    Diabetes Father    Stroke Father     Social History   Socioeconomic History   Marital status: Divorced    Spouse name: Not on file   Number of children: 1   Years of education: Not on file   Highest education level: Not on file  Occupational History   Not on file  Tobacco Use   Smoking status: Former    Years: 5    Types: Cigarettes    Quit date: 05/31/1983    Years since quitting: 39.3   Smokeless tobacco: Never  Vaping Use   Vaping Use: Never used  Substance and Sexual Activity   Alcohol use: No   Drug use: No   Sexual activity: Not on file  Other Topics Concern   Not on file  Social History Narrative   Lives with sister and son   Social Determinants of Health   Financial Resource Strain: Not on file  Food Insecurity: No Food Insecurity (10/09/2022)   Hunger Vital Sign    Worried About Running Out of Food in the Last Year: Never true    Ran Out of Food in the Last Year: Never true  Transportation Needs: Unmet Transportation Needs (10/09/2022)   PRAPARE - Transportation    Lack of Transportation  (Medical): Yes    Lack of Transportation (Non-Medical): Yes  Physical Activity: Not on file  Stress: Not on file  Social Connections: Not on file  Intimate Partner Violence: Not At Risk (10/09/2022)   Humiliation, Afraid, Rape, and Kick questionnaire    Fear of Current or Ex-Partner: No    Emotionally Abused: No    Physically Abused: No    Sexually Abused: No    Physical Exam: Vital signs in last 24 hours: Temp:  [97.6 F (  36.4 C)-98 F (36.7 C)] 98 F (36.7 C) (05/12 1958) Pulse Rate:  [61-82] 61 (05/12 2000) Resp:  [18-27] 26 (05/12 2000) BP: (159-187)/(76-85) 177/76 (05/12 2000) SpO2:  [94 %-98 %] 96 % (05/12 2000) Weight:  [11 kg] 63 kg (05/12 0720) Last BM Date : 10/08/22 General:   Alert,  Well-developed, well-nourished, pleasant and cooperative in NAD Head:  Normocephalic and atraumatic. Eyes:  Sclera clear, no icterus.   Conjunctiva pink. Ears:  Normal auditory acuity. Nose:  No deformity, discharge,  or lesions. Lungs:  Clear throughout to auscultation.   No wheezes, crackles, or rhonchi. No acute distress. Heart:  Regular rate and rhythm; no murmurs, clicks, rubs,  or gallops. Abdomen:  Soft, nontender and nondistended. No masses, hepatosplenomegaly or hernias noted. Normal bowel sounds, without guarding, and without rebound.   Extremities:  Without clubbing or edema.  Intake/Output from previous day: No intake/output data recorded. Intake/Output this shift: No intake/output data recorded.  Lab Results: Recent Labs    10/09/22 0726  WBC 4.9  HGB 10.3*  HCT 31.6*  PLT 94*   BMET Recent Labs    10/09/22 0726  NA 138  K 2.9*  CL 96*  CO2 27  GLUCOSE 95  BUN 37*  CREATININE 7.73*  CALCIUM 8.4*   LFT Recent Labs    10/09/22 0726  PROT 7.5  ALBUMIN 3.7  AST 29  ALT 18  ALKPHOS 182*  BILITOT 1.3*   PT/INR No results for input(s): "LABPROT", "INR" in the last 72 hours. Hepatitis Panel No results for input(s): "HEPBSAG", "HCVAB", "HEPAIGM",  "HEPBIGM" in the last 72 hours.  Studies/Results: No results found.  Assessment/Plan:  62 year old female with history of hypertension, coronary artery disease, congestive heart failure, end-stage renal disease on dialysis now sent to the emergency room by vascular for bleeding from the AV graft site.  Patient is stable dialysis yesterday and she is on a Monday Wednesday Friday schedule.  ESRD: Patient was scheduled for hemodialysis on Monday after vascular access is evaluated by vascular surgery.  ANEMIA: Will continue the anemia protocol with Epogen and IV iron.  MBD: Will monitor PTH, calcium and phosphorus levels.  Continue Sensipar.  HTN/VOL: Blood pressure is better controlled with losartan, hydralazine and amlodipine.  ACCESS: Vascular surgery to evaluate AV graft.  Will continue to follow.   LOS: 0 Lorain Childes, MD [facility] @TODAY @8 :18 PM

## 2022-10-10 ENCOUNTER — Encounter
Admission: EM | Disposition: A | Discharge status: Home / Self Care | Payer: Self-pay | Source: Home / Self Care | Attending: Internal Medicine

## 2022-10-10 ENCOUNTER — Encounter: Payer: Self-pay | Admitting: Surgery

## 2022-10-10 DIAGNOSIS — T82868A Thrombosis of vascular prosthetic devices, implants and grafts, initial encounter: Secondary | ICD-10-CM | POA: Diagnosis not present

## 2022-10-10 DIAGNOSIS — T829XXD Unspecified complication of cardiac and vascular prosthetic device, implant and graft, subsequent encounter: Secondary | ICD-10-CM

## 2022-10-10 DIAGNOSIS — Z9889 Other specified postprocedural states: Secondary | ICD-10-CM

## 2022-10-10 DIAGNOSIS — N186 End stage renal disease: Secondary | ICD-10-CM | POA: Diagnosis not present

## 2022-10-10 DIAGNOSIS — T82858A Stenosis of vascular prosthetic devices, implants and grafts, initial encounter: Secondary | ICD-10-CM

## 2022-10-10 DIAGNOSIS — Z992 Dependence on renal dialysis: Secondary | ICD-10-CM | POA: Diagnosis not present

## 2022-10-10 HISTORY — PX: A/V FISTULAGRAM: CATH118298

## 2022-10-10 LAB — CBC
HCT: 29.8 % — ABNORMAL LOW (ref 36.0–46.0)
Hemoglobin: 9.7 g/dL — ABNORMAL LOW (ref 12.0–15.0)
MCH: 28 pg (ref 26.0–34.0)
MCHC: 32.6 g/dL (ref 30.0–36.0)
MCV: 86.1 fL (ref 80.0–100.0)
Platelets: 104 10*3/uL — ABNORMAL LOW (ref 150–400)
RBC: 3.46 MIL/uL — ABNORMAL LOW (ref 3.87–5.11)
RDW: 16.5 % — ABNORMAL HIGH (ref 11.5–15.5)
WBC: 4.5 10*3/uL (ref 4.0–10.5)
nRBC: 0 % (ref 0.0–0.2)

## 2022-10-10 LAB — COMPREHENSIVE METABOLIC PANEL
ALT: 18 U/L (ref 0–44)
AST: 35 U/L (ref 15–41)
Albumin: 3.2 g/dL — ABNORMAL LOW (ref 3.5–5.0)
Alkaline Phosphatase: 164 U/L — ABNORMAL HIGH (ref 38–126)
Anion gap: 14 (ref 5–15)
BUN: 48 mg/dL — ABNORMAL HIGH (ref 8–23)
CO2: 27 mmol/L (ref 22–32)
Calcium: 8.2 mg/dL — ABNORMAL LOW (ref 8.9–10.3)
Chloride: 96 mmol/L — ABNORMAL LOW (ref 98–111)
Creatinine, Ser: 9.15 mg/dL — ABNORMAL HIGH (ref 0.44–1.00)
GFR, Estimated: 4 mL/min — ABNORMAL LOW (ref >60)
Glucose, Bld: 147 mg/dL — ABNORMAL HIGH (ref 70–99)
Potassium: 4 mmol/L (ref 3.5–5.1)
Sodium: 137 mmol/L (ref 135–145)
Total Bilirubin: 1 mg/dL (ref 0.3–1.2)
Total Protein: 6.7 g/dL (ref 6.5–8.1)

## 2022-10-10 LAB — HEPATITIS B SURFACE ANTIGEN: Hepatitis B Surface Ag: NONREACTIVE

## 2022-10-10 LAB — HIV ANTIBODY (ROUTINE TESTING W REFLEX): HIV Screen 4th Generation wRfx: NONREACTIVE

## 2022-10-10 SURGERY — A/V FISTULAGRAM
Anesthesia: Moderate Sedation

## 2022-10-10 MED ORDER — FENTANYL CITRATE PF 50 MCG/ML IJ SOSY: 12.5000 ug | PREFILLED_SYRINGE | Freq: Once | INTRAMUSCULAR | Status: DC | PRN

## 2022-10-10 MED ORDER — CEFAZOLIN SODIUM-DEXTROSE 1-4 GM/50ML-% IV SOLN
1.0000 g | INTRAVENOUS | Status: AC
  Administered 2022-10-10: 1 g via INTRAVENOUS

## 2022-10-10 MED ORDER — MIDAZOLAM HCL 2 MG/ML PO SYRP: 8.0000 mg | ORAL_SOLUTION | Freq: Once | ORAL | Status: DC | PRN

## 2022-10-10 MED ORDER — SIMETHICONE 80 MG PO CHEW
160.0000 mg | CHEWABLE_TABLET | Freq: Once | ORAL | Status: AC
  Administered 2022-10-10: 160 mg via ORAL
  Filled 2022-10-10: qty 2

## 2022-10-10 MED ORDER — MIDAZOLAM HCL 2 MG/2ML IJ SOLN
INTRAMUSCULAR | Status: DC | PRN
  Administered 2022-10-10: 2 mg via INTRAVENOUS
  Administered 2022-10-10: 1 mg via INTRAVENOUS

## 2022-10-10 MED ORDER — HEPARIN SODIUM (PORCINE) 1000 UNIT/ML IJ SOLN
INTRAMUSCULAR | Status: DC | PRN
  Administered 2022-10-10: 3000 [IU] via INTRAVENOUS

## 2022-10-10 MED ORDER — DIPHENHYDRAMINE HCL 50 MG/ML IJ SOLN: 50.0000 mg | Freq: Once | INTRAMUSCULAR | Status: DC | PRN

## 2022-10-10 MED ORDER — ONDANSETRON HCL 4 MG/2ML IJ SOLN: 4.0000 mg | Freq: Four times a day (QID) | INTRAMUSCULAR | Status: DC | PRN

## 2022-10-10 MED ORDER — FENTANYL CITRATE PF 50 MCG/ML IJ SOSY
PREFILLED_SYRINGE | INTRAMUSCULAR | Status: AC
  Filled 2022-10-10: qty 2

## 2022-10-10 MED ORDER — NEPRO/CARBSTEADY PO LIQD
237.0000 mL | Freq: Two times a day (BID) | ORAL | Status: DC
  Administered 2022-10-11: 237 mL via ORAL

## 2022-10-10 MED ORDER — HYDROMORPHONE HCL 1 MG/ML IJ SOLN: 1.0000 mg | Freq: Once | INTRAMUSCULAR | Status: DC | PRN

## 2022-10-10 MED ORDER — SODIUM CHLORIDE 0.9 % IV SOLN: INTRAVENOUS | Status: DC

## 2022-10-10 MED ORDER — CEFAZOLIN SODIUM-DEXTROSE 1-4 GM/50ML-% IV SOLN
INTRAVENOUS | Status: AC
  Filled 2022-10-10: qty 50

## 2022-10-10 MED ORDER — IODIXANOL 320 MG/ML IV SOLN
INTRAVENOUS | Status: DC | PRN
  Administered 2022-10-10: 37 mL via INTRAVENOUS

## 2022-10-10 MED ORDER — MIDAZOLAM HCL 2 MG/2ML IJ SOLN
INTRAMUSCULAR | Status: AC
  Filled 2022-10-10: qty 4

## 2022-10-10 MED ORDER — FENTANYL CITRATE (PF) 100 MCG/2ML IJ SOLN
INTRAMUSCULAR | Status: DC | PRN
  Administered 2022-10-10: 50 ug via INTRAVENOUS
  Administered 2022-10-10: 25 ug via INTRAVENOUS

## 2022-10-10 MED ORDER — FAMOTIDINE 20 MG PO TABS: 40.0000 mg | ORAL_TABLET | Freq: Once | ORAL | Status: DC | PRN

## 2022-10-10 MED ORDER — HEPARIN SODIUM (PORCINE) 1000 UNIT/ML IJ SOLN
INTRAMUSCULAR | Status: AC
  Filled 2022-10-10: qty 10

## 2022-10-10 MED ORDER — METHYLPREDNISOLONE SODIUM SUCC 125 MG IJ SOLR: 125.0000 mg | Freq: Once | INTRAMUSCULAR | Status: DC | PRN

## 2022-10-10 SURGICAL SUPPLY — 13 items
BALLN ULTRVRSE 9X80X75 (BALLOONS) ×1
BALLOON ULTRVRSE 9X80X75 (BALLOONS) IMPLANT
CANNULA 5F STIFF (CANNULA) IMPLANT
COVER PROBE ULTRASOUND 5X96 (MISCELLANEOUS) IMPLANT
DRAPE BRACHIAL (DRAPES) IMPLANT
KIT ENCORE 26 ADVANTAGE (KITS) IMPLANT
PACK ANGIOGRAPHY (CUSTOM PROCEDURE TRAY) ×1 IMPLANT
SHEATH BRITE TIP 6FRX5.5 (SHEATH) IMPLANT
SHEATH BRITE TIP 8FR 5.5 (SHEATH) IMPLANT
SHEATH BRITE TIP 8FRX11 (SHEATH) IMPLANT
STENT VIABAHN 10X15X120 (Permanent Stent) IMPLANT
SUT MNCRL AB 4-0 PS2 18 (SUTURE) IMPLANT
WIRE SUPRACORE 190CM (WIRE) IMPLANT

## 2022-10-10 NOTE — Progress Notes (Signed)
[facility]  ROUNDING NOTE   Subjective:   Family member at bedside.   Revision of left AVG yesterday by Dr. Myra Gianotti.  Angiogram by Dr. Wyn Quaker today with stent placement.   Objective:  Vital signs in last 24 hours:  Temp:  [97.5 F (36.4 C)-98.5 F (36.9 C)] 98.5 F (36.9 C) (05/13 1425) Pulse Rate:  [57-73] 70 (05/13 1545) Resp:  [11-27] 17 (05/13 1545) BP: (121-187)/(66-85) 141/71 (05/13 1545) SpO2:  [93 %-100 %] 98 % (05/13 1545) Weight:  [63 kg] 63 kg (05/13 1425)  Weight change:  Filed Weights   10/09/22 0720 10/10/22 1425  Weight: 63 kg 63 kg    Intake/Output: I/O last 3 completed shifts: In: 200 [I.V.:200] Out: 100 [Blood:100]   Intake/Output this shift:  No intake/output data recorded.  Physical Exam: General: NAD, laying in bed  Head: Normocephalic, atraumatic. Moist oral mucosal membranes  Eyes: Anicteric, PERRL  Neck: Supple, trachea midline  Lungs:  Clear to auscultation  Heart: Regular rate and rhythm  Abdomen:  Soft, nontender,   Extremities:  no peripheral edema.  Neurologic: Nonfocal, moving all four extremities  Skin: No lesions  Access: Left AVG in dressings    Basic Metabolic Panel: Recent Labs  Lab 10/09/22 0726 10/10/22 0509  NA 138 137  K 2.9* 4.0  CL 96* 96*  CO2 27 27  GLUCOSE 95 147*  BUN 37* 48*  CREATININE 7.73* 9.15*  CALCIUM 8.4* 8.2*    Liver Function Tests: Recent Labs  Lab 10/09/22 0726 10/10/22 0509  AST 29 35  ALT 18 18  ALKPHOS 182* 164*  BILITOT 1.3* 1.0  PROT 7.5 6.7  ALBUMIN 3.7 3.2*   No results for input(s): "LIPASE", "AMYLASE" in the last 168 hours. No results for input(s): "AMMONIA" in the last 168 hours.  CBC: Recent Labs  Lab 10/09/22 0726 10/10/22 0509  WBC 4.9 4.5  HGB 10.3* 9.7*  HCT 31.6* 29.8*  MCV 86.8 86.1  PLT 94* 104*    Cardiac Enzymes: No results for input(s): "CKTOTAL", "CKMB", "CKMBINDEX", "TROPONINI" in the last 168 hours.  BNP: Invalid input(s):  "POCBNP"  CBG: No results for input(s): "GLUCAP" in the last 168 hours.  Microbiology: Results for orders placed or performed during the hospital encounter of 01/10/17  Surgical pcr screen     Status: Abnormal   Collection Time: 01/10/17  8:28 AM   Specimen: Nasal Mucosa; Nasal Swab  Result Value Ref Range Status   MRSA, PCR NEGATIVE NEGATIVE Final   Staphylococcus aureus POSITIVE (A) NEGATIVE Final    Comment:        The Xpert SA Assay (FDA approved for NASAL specimens in patients over 75 years of age), is one component of a comprehensive surveillance program.  Test performance has been validated by [facility] for patients greater than or equal to 54 year old. It is not intended to diagnose infection nor to guide or monitor treatment. RESULT CALLED TO, READ BACK BY AND VERIFIED WITH: MARSHA BENNETT ON 01/10/17 @ 1144 BY JAG     Coagulation Studies: No results for input(s): "LABPROT", "INR" in the last 72 hours.  Urinalysis: No results for input(s): "COLORURINE", "LABSPEC", "PHURINE", "GLUCOSEU", "HGBUR", "BILIRUBINUR", "KETONESUR", "PROTEINUR", "UROBILINOGEN", "NITRITE", "LEUKOCYTESUR" in the last 72 hours.  Invalid input(s): "APPERANCEUR"    Imaging: PERIPHERAL VASCULAR CATHETERIZATION  Result Date: 10/10/2022 See surgical note for result.    Medications:    sodium chloride 10 mL/hr at 10/10/22 1500    [MAR Hold] amLODipine  10 mg Oral Daily   [MAR Hold] atorvastatin  80 mg Oral QHS   [MAR Hold] cinacalcet  30 mg Oral Daily   [MAR Hold] ferric citrate  630 mg Oral TID   [MAR Hold] hydrALAZINE  50 mg Oral BID   [MAR Hold] levothyroxine  75 mcg Oral QAC breakfast   [MAR Hold] losartan  100 mg Oral Daily   [MAR Hold] pneumococcal 20-valent conjugate vaccine  0.5 mL Intramuscular Tomorrow-1000   [MAR Hold] acetaminophen, diphenhydrAMINE, famotidine, fentaNYL (SUBLIMAZE) injection, fentaNYL, heparin sodium (porcine), HYDROmorphone (DILAUDID) injection,  iodixanol, methylPREDNISolone (SOLU-MEDROL) injection, midazolam, midazolam, [MAR Hold] ondansetron **OR** [MAR Hold] ondansetron (ZOFRAN) IV, ondansetron (ZOFRAN) IV  Assessment/ Plan:  [name] is a 62 y.o. black female with end stage renal disease on hemodialysis, hypertension, hypothyroidism, congestive heart failure who is admitted to [facility] on 10/09/2022 for ESRD on hemodialysis (HCC) [N18.6, Z99.2] Complication of vascular graft [T82.9XXA] Hemorrhage associated with renal dialysis graft [facility]) [T82.838A]  [facility] MWF [address] Left AVG 63.2kg  End Stage Renal Disease: with complication of dialysis device.  - appreciate vascular input - dialysis for later today. MWF schedule  Hypertension with chronic kidney disease - continue losartan, amlodipine and hydralazine  Secondary Hyperparathyroidism - Continue Auryxia  Anemia of chronic kidney disease: hemoglobin 9.7. With acute blood loss anemia.  - Mircera as outpatient   LOS: 1 Felicia Butler 5/13/20244:05 PM

## 2022-10-10 NOTE — Progress Notes (Signed)
Progress Note    10/10/2022 10:00 AM 1 Day Post-Op  Subjective:  Felicia Butler is a 62 y.o. female with medical history significant of end-stage renal disease on hemodialysis Monday Wednesday Friday, anemia of chronic disease, GERD, gout, hypertension, hyperlipidemia, hypothyroidism, pulmonary hypertension presenting with left graft bleeding. Patient is now POD#1 from left arm graft with interposition Artegraft.   On exam this morning patient is resting comfortably in bed.  Upper extremity with dressing in place no noted hematoma seroma or drainage.  No infection to note.  Patient endorses soreness but otherwise states she feels well.  Complaints overnight.  Vitals all remained stable.   Vitals:   10/10/22 0817 10/10/22 0934  BP: 129/70   Pulse: 64   Resp: 16   Temp: 97.7 F (36.5 C)   SpO2: 100% 96%   Physical Exam: Cardiac:  RRR, Normal S1, S2  Lungs: Clear on auscultation bilaterally, no rales rhonchi or wheezing noted Incisions: Left upper extremity surgical incision.  Dressing clean dry and intact.  No hematoma seroma to note. Extremities: Palpable pulses throughout.  Left upper extremity with surgical incision, clean dry and intact. Abdomen: Positive bowel sounds throughout, soft, flat, nontender, nondistended. Neurologic: No oriented x 3, follows commands, answers all questions appropriately.  CBC    Component Value Date/Time   WBC 4.5 10/10/2022 0509   RBC 3.46 (L) 10/10/2022 0509   HGB 9.7 (L) 10/10/2022 0509   HGB 11.7 (L) 06/21/2013 0048   HCT 29.8 (L) 10/10/2022 0509   HCT 36.3 06/21/2013 0048   PLT 104 (L) 10/10/2022 0509   PLT 338 06/21/2013 0048   MCV 86.1 10/10/2022 0509   MCV 83 06/21/2013 0048   MCH 28.0 10/10/2022 0509   MCHC 32.6 10/10/2022 0509   RDW 16.5 (H) 10/10/2022 0509   RDW 14.4 06/21/2013 0048   LYMPHSABS 1.3 05/03/2022 0902   MONOABS 0.6 05/03/2022 0902   EOSABS 0.1 05/03/2022 0902   BASOSABS 0.0 05/03/2022 0902    BMET     Component Value Date/Time   NA 137 10/10/2022 0509   NA 138 06/21/2013 0048   K 4.0 10/10/2022 0509   K 4.1 06/21/2013 0048   CL 96 (L) 10/10/2022 0509   CL 108 (H) 06/21/2013 0048   CO2 27 10/10/2022 0509   CO2 22 06/21/2013 0048   GLUCOSE 147 (H) 10/10/2022 0509   GLUCOSE 122 (H) 06/21/2013 0048   BUN 48 (H) 10/10/2022 0509   BUN 44 (H) 06/21/2013 0048   CREATININE 9.15 (H) 10/10/2022 0509   CREATININE 3.33 (H) 06/21/2013 0048   CALCIUM 8.2 (L) 10/10/2022 0509   CALCIUM 9.6 06/21/2013 0048   GFRNONAA 4 (L) 10/10/2022 0509   GFRNONAA 15 (L) 06/21/2013 0048   GFRAA 5 (L) 01/10/2017 0828   GFRAA 17 (L) 06/21/2013 0048    INR    Component Value Date/Time   INR 0.95 01/10/2017 0828     Intake/Output Summary (Last 24 hours) at 10/10/2022 1000 Last data filed at 10/09/2022 1900 Gross per 24 hour  Intake 200 ml  Output 100 ml  Net 100 ml     Assessment/Plan:  62 y.o. female is s/p from left arm graft with interposition Artegraft.  1 Day Post-Op   PLAN:  Vascular surgery patient will be taken to the vascular lab today for a left upper extremity AV fistulogram.  I discussed in detail with the patient the procedure, benefits, risks, and complications.  Verbalizes her understanding.  Answered all the patient's questions.  Patient has been n.p.o. since midnight.  Patient would like to proceed with the procedure as soon as possible.   I discussed the plan in detail with Dr. [name] MD and he agrees with the plan.  DVT prophylaxis:  NONE   Shmuel Girgis R [facility] 10/10/2022 10:00 AM

## 2022-10-10 NOTE — Discharge Instructions (Signed)
Transportation Agency Name: [place] [employer] Address: [address] [place], [place] [address] Phone: [phone] Email: [email or web address] Website: www.[email or web address].org Service(s) Offered: Housing services, self-sufficiency, congregate meal  program, weatherization program, heating appliance  repair/replacement program, emergency food assistance,  housing counseling, home ownership program, [place] program. September 22, 2016 22  Agency Name: [employer] ([employer]) Address: [address], [place], [place] [address] Phone: [phone] Email:  Website: www.acta-[place].[email or web address] Service(s) Offered: Transportation for general public, subscription and demand  response; Dial-a-Ride for citizens 60 years of age or older. Agency Name: Department of Social Services Address: [address], [place],  [address] Phone: [phone] Service(s) Offered: Child support services; child welfare services; food stamps;  Medicaid; work first family assistance; and aid with fuel,  rent, food and medicine, transportation assistance.  Agency Name: [employer]  [employer] Address: Phone: [phone] Service(s) Offered: Transports veterans to the [facility]. Call  forty-eight hours in advance and leave the name, telephone  number, date, and time of appointment. Veteran will be  contacted by the driver the day before the appointment to  arrange a pick up point   

## 2022-10-10 NOTE — Progress Notes (Signed)
Received patient in bed to unit.    Informed consent signed and in chart.    TX duration:3.5     Transported By hospital transport back to room212 Hand-off given to patient's nurse. Raynelle Chary RN   Access used: Lupper AVF Pt has surgical incision with stitches very near cannulation sites, Cannulated with 15g needles and achieved 300 BFR Access issues: None   Total UF removed:1600 Medication(s) given:none Post HD VS: Stable Post HD weight: 60.1kg    [facility]

## 2022-10-10 NOTE — Progress Notes (Addendum)
Progress Note   Patient: Felicia Butler ZOX:[id number] DOB: 1961-05-14 DOA: 10/09/2022     1 DOS: the patient was seen and examined on 10/10/2022    Subjective:  Patient seen and examined at bedside this morning Came in with bleeding from AV graft sites involving the left Bleeding currently under control after vascular surgeon saw patient yesterday Dressing appeared clean and dry Patient awaiting fistulogram today Denies chest pain nausea vomiting abdominal pain or cough   Brief hospital course: From HPI by Dr. Alvester Morin  "Felicia Butler is a 62 y.o. female with medical history significant of end-stage renal disease on hemodialysis Monday Wednesday Friday, anemia of chronic disease, GERD, gout, hypertension, hyperlipidemia, hypothyroidism, pulmonary hypertension presenting with left graft bleeding.  Patient reports having bleeding from her left upper extremity graft since earlier today.  Patient denies any known trauma.  Reports having some issue of bulging over the past 1 to 2 weeks.  Has been followed by outpatient vascular surgery.  Reports getting hemodialysis through the graft without any significant issues up-to-date.  No fevers or chills.  No nausea or vomiting.  No chest pain or shortness of breath.  No recent anticoagulation use. Presented to the ER afebrile, hemodynamically stable.  Satting well on room air.  White count 4.9, 10.3, platelet count 94.  Creatinine 7.73, potassium 2.9, alk phos 182, T. bili 1.3.  Patient evaluated by vascular surgery at the bedside by Dr. Myra Gianotti.  Will plan for procedural intervention today as well as fistulogram tomorrow."    Assessment and Plan:  Complication of vascular graft with associated ulceration and bleeding Noted bleeding of left upper extremity hemodialysis graft Pending procedure correction by vascular surgery today as well as fistulogram tomorrow Patient currently n.p.o. for fistulogram today Case discussed with vascular  surgeon Advancement of diet after surgical intervention today Hold antiplatelet anticoagulation     Hypertension BP stable Titrate home regimen   CAD in native artery No active chest pain Hold antiplatelet/anticoagulation in the setting of bleeding graft   Type 2 diabetes mellitus with diabetic chronic kidney disease (HCC) SSI  A1C   Hypothyroidism Cont synthroid    Hyperlipidemia Cont statin      Gout Cont allopurinol    End stage renal disease (HCC) Baseline end-stage renal disease on hemodialysis Monday Wednesday Friday Will be due for dialysis on 5-13 Nephrology contacted   Anemia in chronic kidney disease Hgb 10.3  Appears near baseline     Advance Care Planning:   Code Status: Full Code    Consults: Vascular surgery w/ Dr. Myra Gianotti   Family Communication: Family at the bedside      Physical Exam: Constitutional:      Appearance: She is normal weight.  HENT:     Head: Normocephalic and atraumatic.     Nose: Nose normal.     Mouth/Throat:     Mouth: Mucous membranes are moist.  Eyes:     Pupils: Pupils are equal, round, and reactive to light.  Cardiovascular:     Rate and Rhythm: Normal rate and regular rhythm.  Pulmonary:     Effort: Pulmonary effort is normal.  Abdominal:     General: Abdomen is flat.  Musculoskeletal:     Comments: Positive left upper extremity fistula region with clean and dry dressing in place, nontender Skin:    General: Skin is warm.  Neurological:     General: No focal deficit present.  Psychiatric:        Mood and Affect: Mood normal.  Data Reviewed: I have reviewed patient's laboratory results today showing sodium 137 potassium 4.0 creatinine 9.1   Time spent: 50 minutes  Vitals:   10/10/22 1516 10/10/22 1521 10/10/22 1526 10/10/22 1531  BP: 129/72 123/71 127/66 121/66  Pulse: 67 68 71 71  Resp: 20 (!) 22 (!) 24 19  Temp:      TempSrc:      SpO2: 98% 99% 99% 99%  Weight:      Height:         Author: Loyce Dys, MD 10/10/2022 3:49 PM  For on call review [email or web address].

## 2022-10-10 NOTE — Progress Notes (Signed)
Order received from Dr Wynelle Link for nepro bid

## 2022-10-10 NOTE — Op Note (Signed)
[facility]    OPERATIVE NOTE   PROCEDURE: 1.   Left brachiocephalic arteriovenous fistula cannulation under ultrasound guidance 2.   Left arm fistulagram including central venogram 3.   Covered stent to multiple areas of stenosis some with thrombus in the jump graft of the left brachiocephalic AV fistula  PRE-OPERATIVE DIAGNOSIS: 1. ESRD 2. Poorly functional left brachiocephalic AVF requiring previous jump graft and recent surgical intervention for bleeding  POST-OPERATIVE DIAGNOSIS: same as above   SURGEON: [name], MD  ANESTHESIA: local with MCS  ESTIMATED BLOOD LOSS: 5 cc  FINDING(S): Multiple areas of stenosis some with thrombus in the Artegraft jump graft of the left brachiocephalic AV fistula.  The anastomosis of the Artegraft to the cephalic vein near the antecubital fossa as well as the brachiocephalic anastomosis were widely patent.  The more central Artegraft anastomosis was also patent and there is no significant central venous stenosis.  SPECIMEN(S):  None  CONTRAST: 37 cc  FLUORO TIME: 1.8 minutes  MODERATE CONSCIOUS SEDATION TIME: Approximately 27 minutes with 3 mg of Versed and 75 mcg of Fentanyl   INDICATIONS: Felicia Butler is a 62 y.o. female who presents with malfunctioning left brachiocephalic arteriovenous fistula that has already previously had an Artegraft jump graft revision.  She was taken the operating room this weekend for bleeding requiring some surgical intervention to the Artegraft.  The patient is scheduled for left arm fistulagram.  The patient is aware the risks include but are not limited to: bleeding, infection, thrombosis of the cannulated access, and possible anaphylactic reaction to the contrast.  The patient is aware of the risks of the procedure and elects to proceed forward.  DESCRIPTION: After full informed written consent was obtained, the patient was brought back to the angiography suite and placed supine  upon the angiography table.  The patient was connected to monitoring equipment. Moderate conscious sedation was administered with a face to face encounter with the patient throughout the procedure with my supervision of the RN administering medicines and monitoring the patient's vital signs and mental status throughout from the start of the procedure until the patient was taken to the recovery room. The left arm was prepped and draped in the standard fashion for a percutaneous access intervention.  Under ultrasound guidance, the left brachiocephalic AV fistula arteriovenous fistula was cannulated with a micropuncture needle under direct ultrasound guidance where it was patent just into the Artegraft jump graft near the antecubital fossa and a permanent image was performed.  The microwire was advanced into the fistula and the needle was exchanged for the a microsheath.  I then upsized to a 6 Fr Sheath and imaging was performed.  Hand injections were completed to image the access including the central venous system. This demonstrated multiple areas of stenosis some with thrombus in the Artegraft jump graft of the left brachiocephalic AV fistula.  The anastomosis of the Artegraft to the cephalic vein near the antecubital fossa as well as the brachiocephalic anastomosis were widely patent.  The more central Artegraft anastomosis was also patent and there is no significant central venous stenosis.  Of note, there was a small echolucent semicircular body on imaging as well.  Based on the images, this patient will need intervention to the jump graft for the areas of thrombus and stenosis. I then gave the patient 3000 units of intravenous heparin.  I then crossed the stenoses with a Supracore wire.  I upsized to an 8 [place] sheath.  Due to  the thrombus present as well as the recent surgical intervention to the jump graft, I did not feel angioplasty alone was safe or likely to be effective and durable.  I elected to place  a covered stent.  A 10 mm diameter by 15 cm length Viabahn stent was then deployed and postdilated with a 9 mm balloon with excellent angiographic completion result, no significant residual stenosis, and resolution of the previous thrombus.   Based on the completion imaging, no further intervention is necessary.  The wire and balloon were removed from the sheath.  A 4-0 Monocryl purse-string suture was sewn around the sheath.  The sheath was removed while tying down the suture.  A sterile bandage was applied to the puncture site.  COMPLICATIONS: None  CONDITION: Stable   [name]  10/10/2022 3:36 PM   This note was created with Dragon Medical transcription system. Any errors in dictation are purely unintentional.

## 2022-10-10 NOTE — TOC CM/SW Note (Signed)
SDOH flag for transportation issues. CSW added resources to AVS. Per HD coordinator, patient gets to HD using [facility].   Charlynn Court, CSW [phone]

## 2022-10-10 NOTE — Interval H&P Note (Signed)
History and Physical Interval Note:  10/10/2022 2:29 PM  Felicia Butler  has presented today for surgery, with the diagnosis of ESRD.  The various methods of treatment have been discussed with the patient and family. After consideration of risks, benefits and other options for treatment, the patient has consented to  Procedure(s): A/V Fistulagram (N/A) as a surgical intervention.  The patient's history has been reviewed, patient examined, no change in status, stable for surgery.  I have reviewed the patient's chart and labs.  Questions were answered to the patient's satisfaction.     [name]

## 2022-10-10 NOTE — Discharge Planning (Signed)
[facility] Outpatient Facility  [facility]  [address]  [place] [address] [phone]  Scheduled days: Monday Wednesday and Friday  Treatment time: 5:40am  Confirmed schedule with Deborha RN, patient transports with [facility].

## 2022-10-11 ENCOUNTER — Encounter: Payer: Self-pay | Admitting: Vascular Surgery

## 2022-10-11 DIAGNOSIS — T829XXD Unspecified complication of cardiac and vascular prosthetic device, implant and graft, subsequent encounter: Secondary | ICD-10-CM | POA: Diagnosis not present

## 2022-10-11 LAB — HEPATITIS B SURFACE ANTIBODY, QUANTITATIVE: Hep B S AB Quant (Post): 66.4 m[IU]/mL (ref >9.9)

## 2022-10-11 MED ORDER — FAMOTIDINE 20 MG PO TABS: 20.0000 mg | ORAL_TABLET | Freq: Every day | ORAL | 3 refills | Status: AC

## 2022-10-11 NOTE — Discharge Summary (Signed)
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 female with medical history significant of end-stage renal disease on hemodialysis Monday Wednesday Friday, anemia of chronic disease, GERD, gout, hypertension, hyperlipidemia, hypothyroidism, pulmonary hypertension presenting with left graft bleeding.  Patient reports having bleeding from her left upper extremity graft that started on the day of presentation.  Patient was seen by vascular surgeon and underwent a left brachiocephalic arteriovenous fistula cannulation under ultrasound guidance, left arm fistulogram including central venogram, covered stent to multiple areas of stenosis some with thrombus in the jump graft of the left brachiocephalic AV fistula.  Patient was able to undergo dialysis after vascular intervention.  Has been cleared by nephrologist as well as vascular surgeon for discharge today and to follow-up as an outpatient.  Consultants: Vascular surgery, nephrology Procedures performed: Fistulogram, hemodialysis, revision of left arm graft with interposition artegraft Disposition: Home Diet recommendation:  Discharge Diet Orders (From admission, onward)     Start     Ordered   10/11/22 0000  Diet - low sodium heart healthy        10/11/22 1226           Cardiac diet DISCHARGE MEDICATION: Allergies as of 10/11/2022   No Known Allergies      Medication List     TAKE these medications    acetaminophen 500 MG  tablet Commonly known as: TYLENOL Take 1,000 mg by mouth every 6 (six) hours as needed.   amLODipine 10 MG tablet Commonly known as: NORVASC Take 10 mg by mouth daily.   atorvastatin 80 MG tablet Commonly known as: LIPITOR Take 80 mg by mouth at bedtime.   Auryxia 1 GM 210 MG(Fe) tablet Generic drug: ferric citrate Take 630 mg by mouth 3 (three) times daily.   cinacalcet 30 MG tablet Commonly known as: SENSIPAR Take 30 mg by mouth daily.   famotidine 20 MG tablet Commonly known as: PEPCID Take 1 tablet (20 mg total) by mouth daily. What changed: when to take this   hydrALAZINE 50 MG tablet Commonly known as: APRESOLINE Take 50 mg by mouth 3 (three) times daily.   levothyroxine 75 MCG tablet Commonly known as: SYNTHROID Take 75 mcg by mouth daily before breakfast.   losartan 100 MG tablet Commonly known as: COZAAR Take 100 mg by mouth daily.   Kathrynn Speed        Discharge Exam: Filed Weights   10/10/22 1425 10/10/22 1703 10/10/22 2213  Weight: 63 kg 61.7 kg 60.6 kg     Appearance: She is normal weight.  HENT:     Head: Normocephalic and atraumatic.     Nose: Nose normal.     Mouth/Throat:     Mouth: Mucous membranes are moist.  Eyes:     Pupils: Pupils are equal, round, and reactive to light.  Cardiovascular:     Rate and Rhythm: Normal rate and regular rhythm.  Pulmonary:     Effort: Pulmonary effort is normal.  Abdominal:     General: Abdomen is flat.  Musculoskeletal:  Comments: Positive left upper extremity fistula region with clean and dry dressing in place, nontender Skin:    General: Skin is warm.  Neurological:     General: No focal deficit present.  Psychiatric:        Mood and Affect: Mood normal.   Condition at discharge: good  Discharge time spent: greater than 30 minutes.  Signed: Loyce Dys, MD [facility] 10/11/2022

## 2022-10-11 NOTE — TOC Transition Note (Signed)
Transition of Care [facility]) - CM/SW Discharge Note   Patient Details  Name: Raimee Roethlisberger MRN: [id number] Date of Birth: 1961-02-12  Transition of Care [facility]) CM/SW Contact:  Darleene Cleaver, LCSW Phone Number: 10/11/2022, 1:24 PM   Clinical Narrative:     Patient discharging back home, SDOH has been addressed, resources have been provided to AVS.  TOC signing off, please reconsult if other TOC needs arise.   Final next level of care: Home/Self Care Barriers to Discharge: Barriers Resolved   Patient Goals and CMS Choice      Discharge Placement                         Discharge Plan and Services Additional resources added to the After Visit Summary for                                       Social Determinants of Health (SDOH) Interventions SDOH Screenings   Food Insecurity: No Food Insecurity (10/09/2022)  Housing: Low Risk  (10/09/2022)  Transportation Needs: Unmet Transportation Needs (10/09/2022)  Utilities: Not At Risk (10/09/2022)  Tobacco Use: Medium Risk (10/11/2022)     Readmission Risk Interventions     No data to display

## 2022-10-11 NOTE — Progress Notes (Signed)
[facility]  ROUNDING NOTE   Subjective:   Hemodialysis treatment yesterday using AVG. Tolerated treatment well.   Objective:  Vital signs in last 24 hours:  Temp:  [97.7 F (36.5 C)-98.5 F (36.9 C)] 98 F (36.7 C) (05/14 0341) Pulse Rate:  [67-72] 71 (05/14 0341) Resp:  [11-25] 20 (05/14 0341) BP: (113-172)/(65-81) 113/74 (05/14 0341) SpO2:  [93 %-99 %] 95 % (05/14 0341) Weight:  [60.6 kg-63 kg] 60.6 kg (05/13 2213)  Weight change: 0 kg Filed Weights   10/10/22 1425 10/10/22 1703 10/10/22 2213  Weight: 63 kg 61.7 kg 60.6 kg    Intake/Output: I/O last 3 completed shifts: In: 84.8 [I.V.:34.8; IV Piggyback:50] Out: 2400 [Urine:800; Other:1600]   Intake/Output this shift:  Total I/O In: 120 [P.O.:120] Out: -   Physical Exam: General: NAD, laying in bed  Head: Normocephalic, atraumatic. Moist oral mucosal membranes  Eyes: Anicteric, PERRL  Neck: Supple, trachea midline  Lungs:  Clear to auscultation  Heart: Regular rate and rhythm  Abdomen:  Soft, nontender,   Extremities:  no peripheral edema.  Neurologic: Nonfocal, moving all four extremities  Skin: No lesions  Access: Left AVG      Basic Metabolic Panel: Recent Labs  Lab 10/09/22 0726 10/10/22 0509  NA 138 137  K 2.9* 4.0  CL 96* 96*  CO2 27 27  GLUCOSE 95 147*  BUN 37* 48*  CREATININE 7.73* 9.15*  CALCIUM 8.4* 8.2*     Liver Function Tests: Recent Labs  Lab 10/09/22 0726 10/10/22 0509  AST 29 35  ALT 18 18  ALKPHOS 182* 164*  BILITOT 1.3* 1.0  PROT 7.5 6.7  ALBUMIN 3.7 3.2*    No results for input(s): "LIPASE", "AMYLASE" in the last 168 hours. No results for input(s): "AMMONIA" in the last 168 hours.  CBC: Recent Labs  Lab 10/09/22 0726 10/10/22 0509  WBC 4.9 4.5  HGB 10.3* 9.7*  HCT 31.6* 29.8*  MCV 86.8 86.1  PLT 94* 104*     Cardiac Enzymes: No results for input(s): "CKTOTAL", "CKMB", "CKMBINDEX", "TROPONINI" in the last 168 hours.  BNP: Invalid  input(s): "POCBNP"  CBG: No results for input(s): "GLUCAP" in the last 168 hours.  Microbiology: Results for orders placed or performed during the hospital encounter of 01/10/17  Surgical pcr screen     Status: Abnormal   Collection Time: 01/10/17  8:28 AM   Specimen: Nasal Mucosa; Nasal Swab  Result Value Ref Range Status   MRSA, PCR NEGATIVE NEGATIVE Final   Staphylococcus aureus POSITIVE (A) NEGATIVE Final    Comment:        The Xpert SA Assay (FDA approved for NASAL specimens in patients over 21 years of age), is one component of a comprehensive surveillance program.  Test performance has been validated by [facility] for patients greater than or equal to 39 year old. It is not intended to diagnose infection nor to guide or monitor treatment. RESULT CALLED TO, READ BACK BY AND VERIFIED WITH: MARSHA BENNETT ON 01/10/17 @ 1144 BY JAG     Coagulation Studies: No results for input(s): "LABPROT", "INR" in the last 72 hours.  Urinalysis: No results for input(s): "COLORURINE", "LABSPEC", "PHURINE", "GLUCOSEU", "HGBUR", "BILIRUBINUR", "KETONESUR", "PROTEINUR", "UROBILINOGEN", "NITRITE", "LEUKOCYTESUR" in the last 72 hours.  Invalid input(s): "APPERANCEUR"    Imaging: PERIPHERAL VASCULAR CATHETERIZATION  Result Date: 10/10/2022 See surgical note for result.    Medications:      amLODipine  10 mg Oral Daily   atorvastatin  80 mg Oral QHS   cinacalcet  30 mg Oral Daily   feeding supplement (NEPRO CARB STEADY)  237 mL Oral BID BM   ferric citrate  630 mg Oral TID   hydrALAZINE  50 mg Oral BID   levothyroxine  75 mcg Oral QAC breakfast   losartan  100 mg Oral Daily   acetaminophen, ondansetron **OR** ondansetron (ZOFRAN) IV  Assessment/ Plan:  [name] is a 62 y.o. black female with end stage renal disease on hemodialysis, hypertension, hypothyroidism, congestive heart failure who is admitted to [facility] on 10/09/2022 for ESRD on hemodialysis (HCC) [N18.6,  Z99.2] Complication of vascular graft [T82.9XXA] Hemorrhage associated with renal dialysis graft [facility]) [T82.838A]  [facility] Nephrology MWF [address] Left AVG 63.2kg  End Stage Renal Disease: with complication of dialysis device.  - appreciate vascular input. Continue to use AVG.  - Continue MWF schedule  Hypertension with chronic kidney disease:  - continue losartan, amlodipine and hydralazine  Secondary Hyperparathyroidism - Continue Auryxia  Anemia of chronic kidney disease: With acute blood loss anemia.  - Mircera as outpatient   LOS: 2 Chasyn Cinque 5/14/20241:47 PM

## 2022-10-11 NOTE — Plan of Care (Signed)
Patient Adequate for discharge. Resolve plan of care.  Felicia Butler

## 2022-10-11 NOTE — Progress Notes (Signed)
Discharge education complete. IV removed. Patient is waiting for her sister to bring her a dress and eating her lunch. Will call for transport when patient is dressed.  Cornell Barman Javaria Knapke

## 2022-10-11 NOTE — Progress Notes (Signed)
Progress Note    10/11/2022 1:14 PM 1 Day Post-Op  Subjective:  Felicia Butler is a 62 y.o. female with medical history significant of end-stage renal disease on hemodialysis Monday Wednesday Friday, anemia of chronic disease, GERD, gout, hypertension, hyperlipidemia, hypothyroidism, pulmonary hypertension presenting with left graft bleeding. Patient is now POD#2 from left arm graft with interposition Artegraft. Also POD #1 from Left Arm A/V Fistulagram including central venogram.    On exam this morning patient is resting comfortably in bed.  Upper extremity with dressing in place no noted hematoma seroma or drainage.  No infection to note.  Patient endorses soreness but otherwise states she feels well.  Patient endorses successful dialysis after procedure yesterday. No complaints overnight.  Vitals all remained stable.   Vitals:   10/10/22 2153 10/11/22 0341  BP: (!) 149/73 113/74  Pulse: 69 71  Resp: 16 20  Temp: 97.7 F (36.5 C) 98 F (36.7 C)  SpO2: 99% 95%   Physical Exam: Cardiac:  RRR, Normal S1, S2  Lungs: Clear on auscultation bilaterally, no rales rhonchi or wheezing noted Incisions: Left upper extremity surgical incision.  Dressing clean dry and intact.  No hematoma seroma to note. Extremities: Palpable pulses throughout.  Left upper extremity with surgical incision, clean dry and intact. Abdomen: Positive bowel sounds throughout, soft, flat, nontender, nondistended. Neurologic: No oriented x 3, follows commands, answers all questions appropriately.   CBC     Component Value Date/Time   WBC 4.5 10/10/2022 0509   RBC 3.46 (L) 10/10/2022 0509   HGB 9.7 (L) 10/10/2022 0509   HGB 11.7 (L) 06/21/2013 0048   HCT 29.8 (L) 10/10/2022 0509   HCT 36.3 06/21/2013 0048   PLT 104 (L) 10/10/2022 0509   PLT 338 06/21/2013 0048   MCV 86.1 10/10/2022 0509   MCV 83 06/21/2013 0048   MCH 28.0 10/10/2022 0509   MCHC 32.6 10/10/2022 0509   RDW 16.5 (H) 10/10/2022 0509   RDW  14.4 06/21/2013 0048   LYMPHSABS 1.3 05/03/2022 0902   MONOABS 0.6 05/03/2022 0902   EOSABS 0.1 05/03/2022 0902   BASOSABS 0.0 05/03/2022 0902    BMET    Component Value Date/Time   NA 137 10/10/2022 0509   NA 138 06/21/2013 0048   K 4.0 10/10/2022 0509   K 4.1 06/21/2013 0048   CL 96 (L) 10/10/2022 0509   CL 108 (H) 06/21/2013 0048   CO2 27 10/10/2022 0509   CO2 22 06/21/2013 0048   GLUCOSE 147 (H) 10/10/2022 0509   GLUCOSE 122 (H) 06/21/2013 0048   BUN 48 (H) 10/10/2022 0509   BUN 44 (H) 06/21/2013 0048   CREATININE 9.15 (H) 10/10/2022 0509   CREATININE 3.33 (H) 06/21/2013 0048   CALCIUM 8.2 (L) 10/10/2022 0509   CALCIUM 9.6 06/21/2013 0048   GFRNONAA 4 (L) 10/10/2022 0509   GFRNONAA 15 (L) 06/21/2013 0048   GFRAA 5 (L) 01/10/2017 0828   GFRAA 17 (L) 06/21/2013 0048    INR    Component Value Date/Time   INR 0.95 01/10/2017 0828     Intake/Output Summary (Last 24 hours) at 10/11/2022 1314 Last data filed at 10/11/2022 1040 Gross per 24 hour  Intake 204.83 ml  Output 2400 ml  Net -2195.17 ml     Assessment/Plan:  62 y.o. female is s/p from left arm graft with interposition Artegraft and Left Arm A/V Fistulagram including central venogram.  1 Day Post-Op   PLAN: Patient is now postop from a left upper extremity  AV fistulogram that was successful in returning blood flow to the Artegraft jump graft. Viabahn stent was used as part of the procedure. Patient underwent successful hemodialysis post procedure.   Vascular surgery okay for patient to discharge.  Follow-up in vein and vascular clinic in 6 weeks for fistula evaluation.    DVT prophylaxis:  NONE   Javaughn Opdahl R Shelbe Haglund [facility] 10/11/2022 1:14 PM

## 2022-10-12 ENCOUNTER — Other Ambulatory Visit: Payer: Self-pay

## 2022-10-12 ENCOUNTER — Telehealth: Payer: Self-pay

## 2022-10-12 DIAGNOSIS — Z86010 Personal history of colonic polyps: Secondary | ICD-10-CM

## 2022-10-12 NOTE — Telephone Encounter (Signed)
Gastroenterology Pre-Procedure Review Patient advised that we will obtain clearances from Pulmonology and Vascular prior to scheduling her colonoscopy.  Once we have received clearances from Dr. Tresa Res and Dr. Gilda Crease we will move forward with scheduling colonoscopy.   Request Date: TBD Requesting Physician: Dr. Jodelle Gross  PATIENT REVIEW QUESTIONS: The patient responded to the following health history questions as indicated:    1. Are you having any GI issues? no 2. Do you have a personal history of Polyps? yes (last colonoscopy 06/28/18 [facility]) 3. Do you have a family history of Colon Cancer or Polyps? no 4. Diabetes Mellitus? no 5. Joint replacements in the past 12 months?no 6. Major health problems in the past 3 months?no 7. Any artificial heart valves, MVP, or defibrillator?no    MEDICATIONS & ALLERGIES:    Patient reports the following regarding taking any anticoagulation/antiplatelet therapy:   Plavix, Coumadin, Eliquis, Xarelto, Lovenox, Pradaxa, Brilinta, or Effient? no Aspirin? no  Patient confirms/reports the following medications:  Current Outpatient Medications  Medication Sig Dispense Refill   acetaminophen (TYLENOL) 500 MG tablet Take 1,000 mg by mouth every 6 (six) hours as needed.     amLODipine (NORVASC) 10 MG tablet Take 10 mg by mouth daily.     atorvastatin (LIPITOR) 80 MG tablet Take 80 mg by mouth at bedtime.     AURYXIA 1 GM 210 MG(Fe) tablet Take 630 mg by mouth 3 (three) times daily.     cinacalcet (SENSIPAR) 30 MG tablet Take 30 mg by mouth daily.     famotidine (PEPCID) 20 MG tablet Take 1 tablet (20 mg total) by mouth daily. 30 tablet 3   hydrALAZINE (APRESOLINE) 50 MG tablet Take 50 mg by mouth 3 (three) times daily.     levothyroxine (SYNTHROID, LEVOTHROID) 75 MCG tablet Take 75 mcg by mouth daily before breakfast.     losartan (COZAAR) 100 MG tablet Take 100 mg by mouth daily.     Methoxy PEG-Epoetin Beta (MIRCERA IJ) Mircera     No current  facility-administered medications for this visit.    Patient confirms/reports the following allergies:  No Known Allergies  No orders of the defined types were placed in this encounter.   AUTHORIZATION INFORMATION Primary Insurance: 1D#: Group #:  Secondary Insurance: 1D#: Group #:  SCHEDULE INFORMATION: Date: TBD Time: Location: [facility]

## 2022-10-25 ENCOUNTER — Telehealth: Payer: Self-pay

## 2022-10-25 NOTE — Telephone Encounter (Signed)
Colonoscopy referral received.  Patients clearances are still pending.  Pt did see her cardiologist this morning I will refax clearance to Zettie Cooley. And will also refax Dr. Gilda Crease for clearance.  Thanks, Marcelino Duster CMA

## 2022-10-26 ENCOUNTER — Telehealth (INDEPENDENT_AMBULATORY_CARE_PROVIDER_SITE_OTHER): Payer: Self-pay

## 2022-10-26 NOTE — Telephone Encounter (Signed)
Spoke with the patient and she is scheduled with Dr. Gilda Crease for a LUE fistulagram on 11/08/22 with a 12:30 pm arrival time to the [facility]. Pre-procedure instructions were discussed and will be sent to Mychart and mailed. Patient was offered 11/01/22 and declined.

## 2022-10-26 NOTE — Telephone Encounter (Signed)
I attempted to contact the patient to scheduled a LUE fistulagram with Dr. Gilda Crease. A message was left for a return call.

## 2022-10-28 ENCOUNTER — Telehealth: Payer: Self-pay

## 2022-10-28 NOTE — Telephone Encounter (Signed)
Cardiac clearance has been granted for patient to have her colonoscopy.  Cardiac clearance obtained from her cardiologist Dr. Haze Justin on 10/28/22.  Vascular Clearance is still pending.  Patient has a procedure scheduled with Dr. Gilda Crease on 11/08/22.  Thanks,  [place], [place]

## 2022-11-04 ENCOUNTER — Telehealth (INDEPENDENT_AMBULATORY_CARE_PROVIDER_SITE_OTHER): Payer: Self-pay

## 2022-11-04 ENCOUNTER — Encounter (INDEPENDENT_AMBULATORY_CARE_PROVIDER_SITE_OTHER): Payer: Self-pay

## 2022-11-04 NOTE — Telephone Encounter (Signed)
Pt called wanting to know about copay for procedure on Tuesday.  I gave pt the number to [facility] Billing dept. She stated understanding

## 2022-11-08 ENCOUNTER — Encounter: Payer: Self-pay | Admitting: Vascular Surgery

## 2022-11-08 ENCOUNTER — Encounter
Admission: RE | Disposition: A | Discharge status: Home / Self Care | Payer: Self-pay | Source: Home / Self Care | Attending: Vascular Surgery

## 2022-11-08 ENCOUNTER — Other Ambulatory Visit: Payer: Self-pay

## 2022-11-08 ENCOUNTER — Ambulatory Visit
Admission: RE | Admit: 2022-11-08 | Discharge: 2022-11-08 | Disposition: A | Discharge status: Home / Self Care | Payer: 59 | Attending: Vascular Surgery | Admitting: Vascular Surgery

## 2022-11-08 DIAGNOSIS — Y841 Kidney dialysis as the cause of abnormal reaction of the patient, or of later complication, without mention of misadventure at the time of the procedure: Secondary | ICD-10-CM | POA: Insufficient documentation

## 2022-11-08 DIAGNOSIS — E1122 Type 2 diabetes mellitus with diabetic chronic kidney disease: Secondary | ICD-10-CM | POA: Diagnosis not present

## 2022-11-08 DIAGNOSIS — Z992 Dependence on renal dialysis: Secondary | ICD-10-CM | POA: Diagnosis not present

## 2022-11-08 DIAGNOSIS — I120 Hypertensive chronic kidney disease with stage 5 chronic kidney disease or end stage renal disease: Secondary | ICD-10-CM | POA: Insufficient documentation

## 2022-11-08 DIAGNOSIS — N186 End stage renal disease: Secondary | ICD-10-CM | POA: Diagnosis not present

## 2022-11-08 DIAGNOSIS — T82858A Stenosis of vascular prosthetic devices, implants and grafts, initial encounter: Secondary | ICD-10-CM | POA: Diagnosis present

## 2022-11-08 DIAGNOSIS — Z87891 Personal history of nicotine dependence: Secondary | ICD-10-CM | POA: Diagnosis not present

## 2022-11-08 HISTORY — PX: A/V FISTULAGRAM: CATH118298

## 2022-11-08 LAB — POTASSIUM (ARMC VASCULAR LAB ONLY): Potassium (ARMC vascular lab): 3.2 mmol/L — ABNORMAL LOW (ref 3.5–5.1)

## 2022-11-08 SURGERY — A/V FISTULAGRAM
Anesthesia: Moderate Sedation | Site: Arm Upper | Laterality: Left

## 2022-11-08 MED ORDER — HYDROMORPHONE HCL 1 MG/ML IJ SOLN: 1.0000 mg | Freq: Once | INTRAMUSCULAR | Status: DC | PRN

## 2022-11-08 MED ORDER — ONDANSETRON HCL 4 MG/2ML IJ SOLN: 4.0000 mg | Freq: Four times a day (QID) | INTRAMUSCULAR | Status: DC | PRN

## 2022-11-08 MED ORDER — MIDAZOLAM HCL 5 MG/5ML IJ SOLN
INTRAMUSCULAR | Status: AC
  Filled 2022-11-08: qty 5

## 2022-11-08 MED ORDER — DIPHENHYDRAMINE HCL 50 MG/ML IJ SOLN: 50.0000 mg | Freq: Once | INTRAMUSCULAR | Status: DC | PRN

## 2022-11-08 MED ORDER — CEFAZOLIN SODIUM-DEXTROSE 1-4 GM/50ML-% IV SOLN
INTRAVENOUS | Status: AC
  Filled 2022-11-08: qty 50

## 2022-11-08 MED ORDER — SODIUM CHLORIDE 0.9 % IV SOLN: INTRAVENOUS | Status: DC

## 2022-11-08 MED ORDER — MIDAZOLAM HCL 2 MG/2ML IJ SOLN
INTRAMUSCULAR | Status: DC | PRN
  Administered 2022-11-08: 1 mg via INTRAVENOUS

## 2022-11-08 MED ORDER — FENTANYL CITRATE (PF) 100 MCG/2ML IJ SOLN
INTRAMUSCULAR | Status: DC | PRN
  Administered 2022-11-08: 50 ug via INTRAVENOUS

## 2022-11-08 MED ORDER — CEFAZOLIN SODIUM-DEXTROSE 1-4 GM/50ML-% IV SOLN
1.0000 g | INTRAVENOUS | Status: AC
  Administered 2022-11-08: 1 g via INTRAVENOUS

## 2022-11-08 MED ORDER — SILVER SULFADIAZINE 1 % EX CREA
TOPICAL_CREAM | CUTANEOUS | Status: DC
  Filled 2022-11-08: qty 20

## 2022-11-08 MED ORDER — MIDAZOLAM HCL 2 MG/ML PO SYRP: 8.0000 mg | ORAL_SOLUTION | Freq: Once | ORAL | Status: DC | PRN

## 2022-11-08 MED ORDER — METHYLPREDNISOLONE SODIUM SUCC 125 MG IJ SOLR: 125.0000 mg | Freq: Once | INTRAMUSCULAR | Status: DC | PRN

## 2022-11-08 MED ORDER — HEPARIN SODIUM (PORCINE) 1000 UNIT/ML IJ SOLN
INTRAMUSCULAR | Status: DC | PRN
  Administered 2022-11-08: 3000 [IU] via INTRAVENOUS

## 2022-11-08 MED ORDER — HEPARIN SODIUM (PORCINE) 1000 UNIT/ML IJ SOLN
INTRAMUSCULAR | Status: AC
  Filled 2022-11-08: qty 10

## 2022-11-08 MED ORDER — FENTANYL CITRATE (PF) 100 MCG/2ML IJ SOLN
INTRAMUSCULAR | Status: AC
  Filled 2022-11-08: qty 2

## 2022-11-08 MED ORDER — FAMOTIDINE 20 MG PO TABS: 40.0000 mg | ORAL_TABLET | Freq: Once | ORAL | Status: DC | PRN

## 2022-11-08 MED ORDER — HEPARIN (PORCINE) IN NACL 1000-0.9 UT/500ML-% IV SOLN
INTRAVENOUS | Status: DC | PRN
  Administered 2022-11-08: 1000 mL

## 2022-11-08 SURGICAL SUPPLY — 15 items
BALLN DORADO 8X60X80 (BALLOONS) ×1
BALLN ULTRVRSE 10X40X75 (BALLOONS) ×1
BALLOON DORADO 8X60X80 (BALLOONS) IMPLANT
BALLOON ULTRVRSE 10X40X75 (BALLOONS) IMPLANT
COVER PROBE ULTRASOUND 5X96 (MISCELLANEOUS) IMPLANT
GOWN STRL REUS W/ TWL LRG LVL3 (GOWN DISPOSABLE) ×1 IMPLANT
GOWN STRL REUS W/TWL LRG LVL3 (GOWN DISPOSABLE) ×1
KIT ENCORE 26 ADVANTAGE (KITS) IMPLANT
NDL ENTRY 21GA 7CM ECHOTIP (NEEDLE) IMPLANT
NEEDLE ENTRY 21GA 7CM ECHOTIP (NEEDLE) ×1 IMPLANT
PACK ANGIOGRAPHY (CUSTOM PROCEDURE TRAY) ×1 IMPLANT
SET INTRO CAPELLA COAXIAL (SET/KITS/TRAYS/PACK) IMPLANT
SHEATH BRITE TIP 6FRX5.5 (SHEATH) IMPLANT
SUT MNCRL AB 4-0 PS2 18 (SUTURE) IMPLANT
WIRE SUPRACORE 190CM (WIRE) IMPLANT

## 2022-11-08 NOTE — Interval H&P Note (Signed)
History and Physical Interval Note:  11/08/2022 12:50 PM  Felicia Butler  has presented today for surgery, with the diagnosis of LUE Fistula    End Stage Renal.  The various methods of treatment have been discussed with the patient and family. After consideration of risks, benefits and other options for treatment, the patient has consented to  Procedure(s): A/V Fistulagram (Left) as a surgical intervention.  The patient's history has been reviewed, patient examined, no change in status, stable for surgery.  I have reviewed the patient's chart and labs.  Questions were answered to the patient's satisfaction.     [name]

## 2022-11-08 NOTE — Interval H&P Note (Signed)
History and Physical Interval Note:  11/08/2022 12:51 PM  Felicia Butler  has presented today for surgery, with the diagnosis of LUE Fistula    End Stage Renal.  The various methods of treatment have been discussed with the patient and family. After consideration of risks, benefits and other options for treatment, the patient has consented to  Procedure(s): A/V Fistulagram (Left) as a surgical intervention.  The patient's history has been reviewed, patient examined, no change in status, stable for surgery.  I have reviewed the patient's chart and labs.  Questions were answered to the patient's satisfaction.     [name]

## 2022-11-08 NOTE — H&P (View-Only) (Signed)
MRN : [id number]  Felicia Butler is a 62 y.o. (09-15-1960) female who presents with chief complaint of check access.  History of Present Illness:   The patient returns to the office for follow up regarding a problem with their dialysis access.   The patient notes a significant increase in bleeding time after decannulation.  The patient has also been informed that there is increased recirculation.    The patient denies hand pain or other symptoms consistent with steal phenomena.  No significant arm swelling.  The patient denies redness or swelling at the access site. The patient denies fever or chills at home or while on dialysis.  No recent shortening of the patient's walking distance or new symptoms consistent with claudication.  No history of rest pain symptoms. No new ulcers or wounds of the lower extremities have occurred.  The patient denies amaurosis fugax or recent TIA symptoms. There are no recent neurological changes noted. There is no history of DVT, PE or superficial thrombophlebitis. No recent episodes of angina or shortness of breath documented.    No outpatient medications have been marked as taking for the 11/08/22 encounter [facility] Encounter).    Past Medical History:  Diagnosis Date   Anemia    Chronic kidney disease    Dialysis patient [facility])    M-W-F   GERD (gastroesophageal reflux disease)    Gout    Hernia    Hyperlipidemia    Hypertension    Hypothyroidism    Moderate pulmonary hypertension (HCC)    TB lung, latent    Thyroid disease     Past Surgical History:  Procedure Laterality Date   A/V FISTULAGRAM N/A 10/10/2022   Procedure: A/V Fistulagram;  Surgeon: Annice Needy, MD;  Location: [facility] INVASIVE CV LAB;  Service: Cardiovascular;  Laterality: N/A;   A/V FISTULAGRAM N/A 02/01/2022   Procedure: A/V Fistulagram;  Surgeon: Renford Dills, MD;  Location: [facility] INVASIVE CV LAB;  Service: Cardiovascular;  Laterality: N/A;   AV  FISTULA PLACEMENT Left 01/18/2017   Procedure: ARTERIOVENOUS (AV) FISTULA CREATION ( BRACHIAL CEPHALIC );  Surgeon: Renford Dills, MD;  Location: [facility] ORS;  Service: Vascular;  Laterality: Left;   BREAST BIOPSY Right 04/30/2013   intraductal papilloma   BREAST BIOPSY Right 04/30/2013   cyst aspiration   BREAST BIOPSY Right 05/27/2013   subareolar duct excision   BREAST BIOPSY Left 05/27/2013   subareolar duct excision   BREAST BIOPSY Left 08/11/2016   path pending   COLONOSCOPY  06/28/2018   DIALYSIS/PERMA CATHETER INSERTION N/A 02/01/2022   Procedure: DIALYSIS/PERMA CATHETER INSERTION;  Surgeon: Renford Dills, MD;  Location: [facility] INVASIVE CV LAB;  Service: Cardiovascular;  Laterality: N/A;   DIALYSIS/PERMA CATHETER REMOVAL N/A 07/28/2022   Procedure: DIALYSIS/PERMA CATHETER REMOVAL;  Surgeon: Annice Needy, MD;  Location: [facility] INVASIVE CV LAB;  Service: Cardiovascular;  Laterality: N/A;   HERNIA REPAIR  1960's   PARTIAL HYSTERECTOMY     1980's   PERIPHERAL VASCULAR THROMBECTOMY Left 04/18/2017   Procedure: PERIPHERAL VASCULAR THROMBECTOMY;  Surgeon: Renford Dills, MD;  Location: [facility] INVASIVE CV LAB;  Service: Cardiovascular;  Laterality: Left;   REVISION OF ARTERIOVENOUS GORETEX GRAFT Left 10/09/2022   Procedure: REVISION OF ARTERIOVENOUS FISTULA;  Surgeon: Nada Libman, MD;  Location: [facility] ORS;  Service: Vascular;  Laterality: Left;   REVISON OF ARTERIOVENOUS FISTULA Left 05/06/2022  Procedure: REVISON OF ARTERIOVENOUS FISTULA (BRACHIAL CEPHALIC);  Surgeon: Renford Dills, MD;  Location: [facility] ORS;  Service: Vascular;  Laterality: Left;    Social History Social History   Tobacco Use   Smoking status: Former    Years: 5    Types: Cigarettes    Quit date: 05/31/1983    Years since quitting: 39.4   Smokeless tobacco: Never  Vaping Use   Vaping Use: Never used  Substance Use Topics   Alcohol use: No   Drug use: No    Family History Family History  Problem  Relation Age of Onset   Cancer Mother    Diabetes Father    Stroke Father     No Known Allergies   REVIEW OF SYSTEMS (Negative unless checked)  Constitutional: [] Weight loss  [] Fever  [] Chills Cardiac: [] Chest pain   [] Chest pressure   [] Palpitations   [] Shortness of breath when laying flat   [] Shortness of breath with exertion. Vascular:  [] Pain in legs with walking   [] Pain in legs at rest  [] History of DVT   [] Phlebitis   [] Swelling in legs   [] Varicose veins   [] Non-healing ulcers Pulmonary:   [] Uses home oxygen   [] Productive cough   [] Hemoptysis   [] Wheeze  [] COPD   [] Asthma Neurologic:  [] Dizziness   [] Seizures   [] History of stroke   [] History of TIA  [] Aphasia   [] Vissual changes   [] Weakness or numbness in arm   [] Weakness or numbness in leg Musculoskeletal:   [] Joint swelling   [] Joint pain   [] Low back pain Hematologic:  [] Easy bruising  [] Easy bleeding   [] Hypercoagulable state   [] Anemic Gastrointestinal:  [] Diarrhea   [] Vomiting  [] Gastroesophageal reflux/heartburn   [] Difficulty swallowing. Genitourinary:  [x] Chronic kidney disease   [] Difficult urination  [] Frequent urination   [] Blood in urine Skin:  [] Rashes   [] Ulcers  Psychological:  [] History of anxiety   []  History of major depression.  Physical Examination  There were no vitals filed for this visit. There is no height or weight on file to calculate BMI. Gen: WD/WN, NAD Head: [place]/AT, No temporalis wasting.  Ear/Nose/Throat: Hearing grossly intact, nares w/o erythema or drainage Eyes: PER, EOMI, sclera nonicteric.  Neck: Supple, no gross masses or lesions.  No JVD.  Pulmonary:  Good air movement, no audible wheezing, no use of accessory muscles.  Cardiac: RRR, precordium non-hyperdynamic. Vascular:   Left brachiocephalic fistula pulsatile Vessel Right Left  Radial Palpable Palpable  Brachial Palpable Palpable  Gastrointestinal: soft, non-distended. No guarding/no peritoneal signs.  Musculoskeletal: M/S 5/5  throughout.  No deformity.  Neurologic: CN 2-12 intact. Pain and light touch intact in extremities.  Symmetrical.  Speech is fluent. Motor exam as listed above. Psychiatric: Judgment intact, Mood & affect appropriate for pt's clinical situation. Dermatologic: No rashes or ulcers noted.  No changes consistent with cellulitis.   CBC Lab Results  Component Value Date   WBC 4.5 10/10/2022   HGB 9.7 (L) 10/10/2022   HCT 29.8 (L) 10/10/2022   MCV 86.1 10/10/2022   PLT 104 (L) 10/10/2022    BMET    Component Value Date/Time   NA 137 10/10/2022 0509   NA 138 06/21/2013 0048   K 4.0 10/10/2022 0509   K 4.1 06/21/2013 0048   CL 96 (L) 10/10/2022 0509   CL 108 (H) 06/21/2013 0048   CO2 27 10/10/2022 0509   CO2 22 06/21/2013 0048   GLUCOSE 147 (H) 10/10/2022 0509   GLUCOSE 122 (H)  06/21/2013 0048   BUN 48 (H) 10/10/2022 0509   BUN 44 (H) 06/21/2013 0048   CREATININE 9.15 (H) 10/10/2022 0509   CREATININE 3.33 (H) 06/21/2013 0048   CALCIUM 8.2 (L) 10/10/2022 0509   CALCIUM 9.6 06/21/2013 0048   GFRNONAA 4 (L) 10/10/2022 0509   GFRNONAA 15 (L) 06/21/2013 0048   GFRAA 5 (L) 01/10/2017 0828   GFRAA 17 (L) 06/21/2013 0048   CrCl cannot be calculated (Patient's most recent lab result is older than the maximum 21 days allowed.).  COAG Lab Results  Component Value Date   INR 0.95 01/10/2017    Radiology PERIPHERAL VASCULAR CATHETERIZATION  Result Date: 10/10/2022 See surgical note for result.    Assessment/Plan 1. ESRD (end stage renal disease) (HCC) Recommend:   The patient is experiencing increasing problems with their dialysis access.   Patient should have a fistulagram with the intention for intervention.  The intention for intervention is to restore appropriate flow and prevent thrombosis and possible loss of the access.  As well as improve the quality of dialysis therapy.   The risks, benefits and alternative therapies were reviewed in detail with the patient.  All  questions were answered.  The patient agrees to proceed with angio/intervention.     The patient will follow up with me in the office after the procedure.     2. Essential hypertension Continue antihypertensive medications as already ordered, these medications have been reviewed and there are no changes at this time.   3. Type 2 diabetes mellitus with stage 3 chronic kidney disease, unspecified whether long term insulin use, unspecified whether stage 3a or 3b CKD (HCC) Continue hypoglycemic medications as already ordered, these medications have been reviewed and there are no changes at this time.   Hgb A1C to be monitored as already arranged by primary service     [name], MD  11/08/2022 12:48 PM

## 2022-11-08 NOTE — Progress Notes (Signed)
                    MRN : [id number]  Felicia Butler is a 62 y.o. (08/02/1960) female who presents with chief complaint of check access.  History of Present Illness:   The patient returns to the office for follow up regarding a problem with their dialysis access.   The patient notes a significant increase in bleeding time after decannulation.  The patient has also been informed that there is increased recirculation.    The patient denies hand pain or other symptoms consistent with steal phenomena.  No significant arm swelling.  The patient denies redness or swelling at the access site. The patient denies fever or chills at home or while on dialysis.  No recent shortening of the patient's walking distance or new symptoms consistent with claudication.  No history of rest pain symptoms. No new ulcers or wounds of the lower extremities have occurred.  The patient denies amaurosis fugax or recent TIA symptoms. There are no recent neurological changes noted. There is no history of DVT, PE or superficial thrombophlebitis. No recent episodes of angina or shortness of breath documented.    No outpatient medications have been marked as taking for the 11/08/22 encounter (Hospital Encounter).    Past Medical History:  Diagnosis Date   Anemia    Chronic kidney disease    Dialysis patient (HCC)    M-W-F   GERD (gastroesophageal reflux disease)    Gout    Hernia    Hyperlipidemia    Hypertension    Hypothyroidism    Moderate pulmonary hypertension (HCC)    TB lung, latent    Thyroid disease     Past Surgical History:  Procedure Laterality Date   A/V FISTULAGRAM N/A 10/10/2022   Procedure: A/V Fistulagram;  Surgeon: Dew, Jason S, MD;  Location: [facility];  Service: Cardiovascular;  Laterality: N/A;   A/V FISTULAGRAM N/A 02/01/2022   Procedure: A/V Fistulagram;  Surgeon: Leslee Haueter G, MD;  Location: [facility];  Service: Cardiovascular;  Laterality: N/A;   AV  FISTULA PLACEMENT Left 01/18/2017   Procedure: ARTERIOVENOUS (AV) FISTULA CREATION ( BRACHIAL CEPHALIC );  Surgeon: Floraine Buechler G, MD;  Location: [facility];  Service: Vascular;  Laterality: Left;   BREAST BIOPSY Right 04/30/2013   intraductal papilloma   BREAST BIOPSY Right 04/30/2013   cyst aspiration   BREAST BIOPSY Right 05/27/2013   subareolar duct excision   BREAST BIOPSY Left 05/27/2013   subareolar duct excision   BREAST BIOPSY Left 08/11/2016   path pending   COLONOSCOPY  06/28/2018   DIALYSIS/PERMA CATHETER INSERTION N/A 02/01/2022   Procedure: DIALYSIS/PERMA CATHETER INSERTION;  Surgeon: Rayna Brenner G, MD;  Location: [facility];  Service: Cardiovascular;  Laterality: N/A;   DIALYSIS/PERMA CATHETER REMOVAL N/A 07/28/2022   Procedure: DIALYSIS/PERMA CATHETER REMOVAL;  Surgeon: Dew, Jason S, MD;  Location: [facility];  Service: Cardiovascular;  Laterality: N/A;   HERNIA REPAIR  1960's   PARTIAL HYSTERECTOMY     1980's   PERIPHERAL VASCULAR THROMBECTOMY Left 04/18/2017   Procedure: PERIPHERAL VASCULAR THROMBECTOMY;  Surgeon: Gregroy Dombkowski G, MD;  Location: [facility];  Service: Cardiovascular;  Laterality: Left;   REVISION OF ARTERIOVENOUS GORETEX GRAFT Left 10/09/2022   Procedure: REVISION OF ARTERIOVENOUS FISTULA;  Surgeon: Brabham, Vance W, MD;  Location: [facility];  Service: Vascular;  Laterality: Left;   REVISON OF ARTERIOVENOUS FISTULA Left 05/06/2022     Procedure: REVISON OF ARTERIOVENOUS FISTULA (BRACHIAL CEPHALIC);  Surgeon: Kristen Fromm G, MD;  Location: [facility];  Service: Vascular;  Laterality: Left;    Social History Social History   Tobacco Use   Smoking status: Former    Years: 5    Types: Cigarettes    Quit date: 05/31/1983    Years since quitting: 39.4   Smokeless tobacco: Never  Vaping Use   Vaping Use: Never used  Substance Use Topics   Alcohol use: No   Drug use: No    Family History Family History  Problem  Relation Age of Onset   Cancer Mother    Diabetes Father    Stroke Father     No Known Allergies   REVIEW OF SYSTEMS (Negative unless checked)  Constitutional: []Weight loss  []Fever  []Chills Cardiac: []Chest pain   []Chest pressure   []Palpitations   []Shortness of breath when laying flat   []Shortness of breath with exertion. Vascular:  []Pain in legs with walking   []Pain in legs at rest  []History of DVT   []Phlebitis   []Swelling in legs   []Varicose veins   []Non-healing ulcers Pulmonary:   []Uses home oxygen   []Productive cough   []Hemoptysis   []Wheeze  []COPD   []Asthma Neurologic:  []Dizziness   []Seizures   []History of stroke   []History of TIA  []Aphasia   []Vissual changes   []Weakness or numbness in arm   []Weakness or numbness in leg Musculoskeletal:   []Joint swelling   []Joint pain   []Low back pain Hematologic:  []Easy bruising  []Easy bleeding   []Hypercoagulable state   []Anemic Gastrointestinal:  []Diarrhea   []Vomiting  []Gastroesophageal reflux/heartburn   []Difficulty swallowing. Genitourinary:  [x]Chronic kidney disease   []Difficult urination  []Frequent urination   []Blood in urine Skin:  []Rashes   []Ulcers  Psychological:  []History of anxiety   [] History of major depression.  Physical Examination  There were no vitals filed for this visit. There is no height or weight on file to calculate BMI. Gen: WD/WN, NAD Head: [place]/AT, No temporalis wasting.  Ear/Nose/Throat: Hearing grossly intact, nares w/o erythema or drainage Eyes: PER, EOMI, sclera nonicteric.  Neck: Supple, no gross masses or lesions.  No JVD.  Pulmonary:  Good air movement, no audible wheezing, no use of accessory muscles.  Cardiac: RRR, precordium non-hyperdynamic. Vascular:   Left brachiocephalic fistula pulsatile Vessel Right Left  Radial Palpable Palpable  Brachial Palpable Palpable  Gastrointestinal: soft, non-distended. No guarding/no peritoneal signs.  Musculoskeletal: M/S 5/5  throughout.  No deformity.  Neurologic: CN 2-12 intact. Pain and light touch intact in extremities.  Symmetrical.  Speech is fluent. Motor exam as listed above. Psychiatric: Judgment intact, Mood & affect appropriate for pt's clinical situation. Dermatologic: No rashes or ulcers noted.  No changes consistent with cellulitis.   CBC Lab Results  Component Value Date   WBC 4.5 10/10/2022   HGB 9.7 (L) 10/10/2022   HCT 29.8 (L) 10/10/2022   MCV 86.1 10/10/2022   PLT 104 (L) 10/10/2022    BMET    Component Value Date/Time   NA 137 10/10/2022 0509   NA 138 06/21/2013 0048   K 4.0 10/10/2022 0509   K 4.1 06/21/2013 0048   CL 96 (L) 10/10/2022 0509   CL 108 (H) 06/21/2013 0048   CO2 27 10/10/2022 0509   CO2 22 06/21/2013 0048   GLUCOSE 147 (H) 10/10/2022 0509   GLUCOSE 122 (H)   06/21/2013 0048   BUN 48 (H) 10/10/2022 0509   BUN 44 (H) 06/21/2013 0048   CREATININE 9.15 (H) 10/10/2022 0509   CREATININE 3.33 (H) 06/21/2013 0048   CALCIUM 8.2 (L) 10/10/2022 0509   CALCIUM 9.6 06/21/2013 0048   GFRNONAA 4 (L) 10/10/2022 0509   GFRNONAA 15 (L) 06/21/2013 0048   GFRAA 5 (L) 01/10/2017 0828   GFRAA 17 (L) 06/21/2013 0048   CrCl cannot be calculated (Patient's most recent lab result is older than the maximum 21 days allowed.).  COAG Lab Results  Component Value Date   INR 0.95 01/10/2017    Radiology PERIPHERAL VASCULAR CATHETERIZATION  Result Date: 10/10/2022 See surgical note for result.    Assessment/Plan 1. ESRD (end stage renal disease) (HCC) Recommend:   The patient is experiencing increasing problems with their dialysis access.   Patient should have a fistulagram with the intention for intervention.  The intention for intervention is to restore appropriate flow and prevent thrombosis and possible loss of the access.  As well as improve the quality of dialysis therapy.   The risks, benefits and alternative therapies were reviewed in detail with the patient.  All  questions were answered.  The patient agrees to proceed with angio/intervention.     The patient will follow up with me in the office after the procedure.     2. Essential hypertension Continue antihypertensive medications as already ordered, these medications have been reviewed and there are no changes at this time.   3. Type 2 diabetes mellitus with stage 3 chronic kidney disease, unspecified whether long term insulin use, unspecified whether stage 3a or 3b CKD (HCC) Continue hypoglycemic medications as already ordered, these medications have been reviewed and there are no changes at this time.   Hgb A1C to be monitored as already arranged by primary service     Yidel Teuscher, MD  11/08/2022 12:48 PM   

## 2022-11-08 NOTE — Op Note (Signed)
OPERATIVE NOTE   PROCEDURE: Contrast injection left upper arm AV access Percutaneous transluminal angioplasty left upper arm AV access  PRE-OPERATIVE DIAGNOSIS: Complication of dialysis access                                                       End Stage Renal Disease  POST-OPERATIVE DIAGNOSIS: same as above   SURGEON: Renford Dills, M.D.  ANESTHESIA: Conscious sedation was administered under my direct supervision by the interventional radiology RN. IV Versed plus fentanyl were utilized. Continuous ECG, pulse oximetry and blood pressure was monitored throughout the entire procedure.  Conscious sedation was for a total of 28 minutes.  ESTIMATED BLOOD LOSS: minimal  FINDING(S): Stricture of the AV graft  SPECIMEN(S):  None  CONTRAST: 20 cc  FLUOROSCOPY TIME: 2.1 minutes  INDICATIONS: Felicia Butler is a 62 y.o. female who  presents with malfunctioning left arm AV access.  The patient is scheduled for angiography with possible intervention of the AV access.  The patient is aware the risks include but are not limited to: bleeding, infection, thrombosis of the cannulated access, and possible anaphylactic reaction to the contrast.  The patient acknowledges if the access can not be salvaged a tunneled catheter will be needed and will be placed during this procedure.  The patient is aware of the risks of the procedure and elects to proceed with the angiogram and intervention.  DESCRIPTION: After full informed written consent was obtained, the patient was brought back to the Special Procedure suite and placed supine position.  Appropriate cardiopulmonary monitors were placed.  The left arm was prepped and draped in the standard fashion.  Appropriate timeout is called. The left arm was cannulated with a micropuncture needle.  Cannulation was performed with ultrasound guidance. Ultrasound was placed in a sterile sleeve, the AV access was interrogated and noted to be echolucent and  compressible indicating patency. Image was recorded for the permanent record. The puncture is performed under continuous ultrasound visualization.   The microwire was advanced and the needle was exchanged for  a microsheath.  The J-wire was then advanced and a 6 Fr sheath inserted.  Hand injections were completed to image the access from the arterial anastomosis through the entire access.  The central venous structures were also imaged by hand injections.  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  Previously placed stent in the midportion of the graft is widely patent.  Previously placed stent in the proximal cephalic vein at the cephalic confluence appears to have greater than 60% stenosis at its leading edge.  The subclavian and Ondemet and superior vena cava appear widely patent.  Later in the case reflux imaging of the arterial anastomosis and arterial portion of the graft demonstrate wide patency.  3000 units of heparin was given and a supra core wire was negotiated through the strictures within the peripheral venous portion of the graft.  An 8 mm x 60 mm [place] balloon was used to balloon 2 separate locations both of which were peripheral.  Inflations were to 10 to 12 atm for approximately 1 minute.  A 10 mm x 40 mm balloon was then inflated within the midportion of the previously placed stent and reflux imaging of the  arterial anastomosis was performed.  Follow-up imaging demonstrates complete resolution of the stricture with rapid flow of contrast through the graft, the central venous anatomy is preserved.  A 4-0 Monocryl purse-string suture was sewn around the sheath.  The sheath was removed and light pressure was applied.  A sterile bandage was applied to the puncture site.  The previously placed sutures from the revision surgery performed Oct 09, 2022 were then removed.  There is a small area in the midportion where the  skin has not epithelialized but the wound bed looks healthy.  This was dressed with Silvadene cream.  Of note there is a radiodense curved object noted adjacent to the stent in the midportion of the graft    COMPLICATIONS: None  CONDITION: Good  Renford Dills, M.D [facility] Office: [phone]  11/08/2022 4:10 PM

## 2022-11-09 ENCOUNTER — Encounter: Payer: Self-pay | Admitting: Vascular Surgery

## 2022-11-15 ENCOUNTER — Telehealth (INDEPENDENT_AMBULATORY_CARE_PROVIDER_SITE_OTHER): Payer: Self-pay

## 2022-11-15 ENCOUNTER — Telehealth: Payer: Self-pay

## 2022-11-15 NOTE — Telephone Encounter (Signed)
Left voice message with Dr. Marijean Heath office to request an update on patients vascular clearance to see if we need to postpone scheduling her colonoscopy, since clearance has not been received.  Thanks,  [place], [place]

## 2022-11-15 NOTE — Telephone Encounter (Signed)
She can move forward with intervention

## 2022-11-15 NOTE — Telephone Encounter (Signed)
Left VM for Felicia Butler that pt can continue with intervention and if any further questions she could call the office

## 2022-11-17 ENCOUNTER — Telehealth: Payer: Self-pay

## 2022-11-17 NOTE — Telephone Encounter (Signed)
Vascular clearance has been granted.  Noted in chart note 11/15/22 from Vascular and phone call received.  Patient has been contacted to schedule her colonoscopy.  We have received cardiac clearance and vascular clearance.  Thanks, [place], [place]

## 2022-11-21 ENCOUNTER — Other Ambulatory Visit: Payer: Self-pay

## 2022-11-21 ENCOUNTER — Telehealth: Payer: Self-pay

## 2022-11-21 DIAGNOSIS — Z1211 Encounter for screening for malignant neoplasm of colon: Secondary | ICD-10-CM

## 2022-11-21 DIAGNOSIS — D126 Benign neoplasm of colon, unspecified: Secondary | ICD-10-CM

## 2022-11-21 MED ORDER — NA SULFATE-K SULFATE-MG SULF 17.5-3.13-1.6 GM/177ML PO SOLN: 1.0000 | Freq: Once | ORAL | 0 refills | Status: AC

## 2022-11-21 NOTE — Telephone Encounter (Signed)
Clearances have been granted for patient to move forward with her colonoscopy.  Triage completed on 10/12/22.  Colonoscopy Scheduled 01/10/23 at [facility] with Dr. Anna.  Thanks, Debraann Livingstone, CMA 

## 2022-11-21 NOTE — Progress Notes (Signed)
Clearances have been granted for patient to move forward with her colonoscopy.  Triage completed on 10/12/22.  Colonoscopy Scheduled 01/10/23 at [facility] with Dr. Tobi Bastos.  Thanks, [place], [place]

## 2022-11-30 ENCOUNTER — Other Ambulatory Visit (INDEPENDENT_AMBULATORY_CARE_PROVIDER_SITE_OTHER): Payer: Self-pay | Admitting: Vascular Surgery

## 2022-11-30 DIAGNOSIS — N186 End stage renal disease: Secondary | ICD-10-CM

## 2022-12-06 ENCOUNTER — Ambulatory Visit
Admission: RE | Admit: 2022-12-06 | Discharge: 2022-12-06 | Disposition: A | Discharge status: Home / Self Care | Payer: 59 | Source: Ambulatory Visit | Attending: Family Medicine | Admitting: Family Medicine

## 2022-12-06 DIAGNOSIS — Z1231 Encounter for screening mammogram for malignant neoplasm of breast: Secondary | ICD-10-CM | POA: Diagnosis not present

## 2022-12-08 ENCOUNTER — Other Ambulatory Visit: Payer: Self-pay | Admitting: Family Medicine

## 2022-12-08 ENCOUNTER — Ambulatory Visit (INDEPENDENT_AMBULATORY_CARE_PROVIDER_SITE_OTHER): Payer: 59 | Admitting: Nurse Practitioner

## 2022-12-08 ENCOUNTER — Encounter (INDEPENDENT_AMBULATORY_CARE_PROVIDER_SITE_OTHER): Payer: Self-pay

## 2022-12-08 ENCOUNTER — Encounter (INDEPENDENT_AMBULATORY_CARE_PROVIDER_SITE_OTHER): Payer: 59

## 2022-12-08 DIAGNOSIS — R928 Other abnormal and inconclusive findings on diagnostic imaging of breast: Secondary | ICD-10-CM

## 2022-12-13 ENCOUNTER — Ambulatory Visit
Admission: RE | Admit: 2022-12-13 | Discharge: 2022-12-13 | Disposition: A | Discharge status: Home / Self Care | Payer: 59 | Source: Ambulatory Visit | Attending: Family Medicine | Admitting: Family Medicine

## 2022-12-13 DIAGNOSIS — R928 Other abnormal and inconclusive findings on diagnostic imaging of breast: Secondary | ICD-10-CM | POA: Diagnosis present

## 2022-12-15 ENCOUNTER — Ambulatory Visit (INDEPENDENT_AMBULATORY_CARE_PROVIDER_SITE_OTHER): Payer: 59 | Admitting: Vascular Surgery

## 2022-12-15 ENCOUNTER — Encounter (INDEPENDENT_AMBULATORY_CARE_PROVIDER_SITE_OTHER): Payer: Self-pay

## 2022-12-15 ENCOUNTER — Encounter (INDEPENDENT_AMBULATORY_CARE_PROVIDER_SITE_OTHER): Payer: 59

## 2022-12-19 ENCOUNTER — Other Ambulatory Visit: Payer: Self-pay | Admitting: Family Medicine

## 2022-12-19 DIAGNOSIS — N63 Unspecified lump in unspecified breast: Secondary | ICD-10-CM

## 2022-12-19 DIAGNOSIS — R928 Other abnormal and inconclusive findings on diagnostic imaging of breast: Secondary | ICD-10-CM

## 2022-12-27 ENCOUNTER — Ambulatory Visit
Admission: RE | Admit: 2022-12-27 | Discharge: 2022-12-27 | Disposition: A | Discharge status: Home / Self Care | Payer: 59 | Source: Ambulatory Visit | Attending: Family Medicine | Admitting: Family Medicine

## 2022-12-27 DIAGNOSIS — N63 Unspecified lump in unspecified breast: Secondary | ICD-10-CM | POA: Insufficient documentation

## 2022-12-27 DIAGNOSIS — R928 Other abnormal and inconclusive findings on diagnostic imaging of breast: Secondary | ICD-10-CM | POA: Insufficient documentation

## 2022-12-27 DIAGNOSIS — C50919 Malignant neoplasm of unspecified site of unspecified female breast: Secondary | ICD-10-CM

## 2022-12-27 HISTORY — DX: Malignant neoplasm of unspecified site of unspecified female breast: C50.919

## 2022-12-27 HISTORY — PX: BREAST BIOPSY: SHX20

## 2022-12-27 MED ORDER — LIDOCAINE HCL 1 % IJ SOLN
2.0000 mL | Freq: Once | INTRAMUSCULAR | Status: AC
  Administered 2022-12-27: 2 mL via INTRADERMAL
  Filled 2022-12-27: qty 2

## 2022-12-27 MED ORDER — LIDOCAINE-EPINEPHRINE 1 %-1:100000 IJ SOLN
8.0000 mL | Freq: Once | INTRAMUSCULAR | Status: AC
  Administered 2022-12-27: 8 mL
  Filled 2022-12-27: qty 8

## 2022-12-29 ENCOUNTER — Encounter: Payer: Self-pay | Admitting: *Deleted

## 2022-12-29 DIAGNOSIS — C50919 Malignant neoplasm of unspecified site of unspecified female breast: Secondary | ICD-10-CM

## 2022-12-29 HISTORY — DX: Malignant neoplasm of unspecified site of unspecified female breast: C50.919

## 2022-12-29 NOTE — Progress Notes (Signed)
Received referral for newly diagnosed breast cancer from [facility].  Navigation initiated.  She will see Dr. Cathie Hoops on 8/6 and Dr. Claudine Mouton 8/8.

## 2023-01-03 ENCOUNTER — Encounter: Payer: Self-pay | Admitting: *Deleted

## 2023-01-03 ENCOUNTER — Inpatient Hospital Stay: Payer: 59

## 2023-01-03 ENCOUNTER — Inpatient Hospital Stay: Payer: 59 | Attending: Oncology | Admitting: Oncology

## 2023-01-03 ENCOUNTER — Encounter: Payer: Self-pay | Admitting: Oncology

## 2023-01-03 ENCOUNTER — Telehealth: Payer: Self-pay | Admitting: *Deleted

## 2023-01-03 ENCOUNTER — Other Ambulatory Visit: Payer: Self-pay | Admitting: *Deleted

## 2023-01-03 ENCOUNTER — Telehealth: Payer: Self-pay

## 2023-01-03 VITALS — BP 176/80 | HR 73 | Temp 97.0°F | Resp 18 | Ht 61.0 in | Wt 143.0 lb

## 2023-01-03 DIAGNOSIS — D0511 Intraductal carcinoma in situ of right breast: Secondary | ICD-10-CM | POA: Diagnosis present

## 2023-01-03 DIAGNOSIS — C50919 Malignant neoplasm of unspecified site of unspecified female breast: Secondary | ICD-10-CM

## 2023-01-03 DIAGNOSIS — D631 Anemia in chronic kidney disease: Secondary | ICD-10-CM

## 2023-01-03 DIAGNOSIS — Z809 Family history of malignant neoplasm, unspecified: Secondary | ICD-10-CM | POA: Insufficient documentation

## 2023-01-03 DIAGNOSIS — Z170 Estrogen receptor positive status [ER+]: Secondary | ICD-10-CM | POA: Diagnosis not present

## 2023-01-03 DIAGNOSIS — N186 End stage renal disease: Secondary | ICD-10-CM | POA: Diagnosis not present

## 2023-01-03 DIAGNOSIS — Z79899 Other long term (current) drug therapy: Secondary | ICD-10-CM | POA: Insufficient documentation

## 2023-01-03 DIAGNOSIS — Z992 Dependence on renal dialysis: Secondary | ICD-10-CM

## 2023-01-03 LAB — CBC WITH DIFFERENTIAL/PLATELET
Abs Immature Granulocytes: 0.02 10*3/uL (ref 0.00–0.07)
Basophils Absolute: 0.1 10*3/uL (ref 0.0–0.1)
Basophils Relative: 1 %
Eosinophils Absolute: 0.1 10*3/uL (ref 0.0–0.5)
Eosinophils Relative: 2 %
HCT: 32.8 % — ABNORMAL LOW (ref 36.0–46.0)
Hemoglobin: 10.8 g/dL — ABNORMAL LOW (ref 12.0–15.0)
Immature Granulocytes: 0 %
Lymphocytes Relative: 22 %
Lymphs Abs: 1.2 10*3/uL (ref 0.7–4.0)
MCH: 28 pg (ref 26.0–34.0)
MCHC: 32.9 g/dL (ref 30.0–36.0)
MCV: 85 fL (ref 80.0–100.0)
Monocytes Absolute: 0.6 10*3/uL (ref 0.1–1.0)
Monocytes Relative: 11 %
Neutro Abs: 3.3 10*3/uL (ref 1.7–7.7)
Neutrophils Relative %: 64 %
Platelets: 141 10*3/uL — ABNORMAL LOW (ref 150–400)
RBC: 3.86 MIL/uL — ABNORMAL LOW (ref 3.87–5.11)
RDW: 17.8 % — ABNORMAL HIGH (ref 11.5–15.5)
WBC: 5.3 10*3/uL (ref 4.0–10.5)
nRBC: 0 % (ref 0.0–0.2)

## 2023-01-03 LAB — COMPREHENSIVE METABOLIC PANEL
ALT: 16 U/L (ref 0–44)
AST: 25 U/L (ref 15–41)
Albumin: 3.7 g/dL (ref 3.5–5.0)
Alkaline Phosphatase: 213 U/L — ABNORMAL HIGH (ref 38–126)
Anion gap: 14 (ref 5–15)
BUN: 49 mg/dL — ABNORMAL HIGH (ref 8–23)
CO2: 29 mmol/L (ref 22–32)
Calcium: 8.6 mg/dL — ABNORMAL LOW (ref 8.9–10.3)
Chloride: 95 mmol/L — ABNORMAL LOW (ref 98–111)
Creatinine, Ser: 6.8 mg/dL — ABNORMAL HIGH (ref 0.44–1.00)
GFR, Estimated: 6 mL/min — ABNORMAL LOW (ref >60)
Glucose, Bld: 75 mg/dL (ref 70–99)
Potassium: 3.9 mmol/L (ref 3.5–5.1)
Sodium: 138 mmol/L (ref 135–145)
Total Bilirubin: 0.9 mg/dL (ref 0.3–1.2)
Total Protein: 7.8 g/dL (ref 6.5–8.1)

## 2023-01-03 NOTE — Telephone Encounter (Signed)
Pt called and left message to schedule colonoscopy please return call

## 2023-01-03 NOTE — Progress Notes (Signed)
Hematology/Oncology Consult Note [phone] [phone]     REFERRING PROVIDER: Resa Miner, MD    CHIEF COMPLAINTS/PURPOSE OF CONSULTATION:  Right breast invasive carcinoma, ER PR positive, HER2 negative  ASSESSMENT & PLAN:   Cancer Staging  Invasive carcinoma of breast (HCC) Staging form: Breast, AJCC 8th Edition - Clinical stage from 01/03/2023: cT1b, cN0, cM0 - Unsigned   Invasive carcinoma of breast [facility]) Pathology and radiology counseling: Discussed with the patient, the details of pathology including the type of breast cancer,the clinical staging, the significance of ER, PR and HER-2/neu receptors and the implications for treatment. After reviewing the pathology in detail, we proceeded to discuss the different treatment options between surgery, radiation, chemotherapy, antiestrogen therapies.  cT1b cN0  Treatment plan: 1.  Recommend breast conserving surgery with sentinel lymph node biopsy 2. Oncotype DX testing on the surgical specimen to determine if she would benefit from adjuvant chemo 3.  Adjuvant radiation 4.  Adjuvant antiestrogen therapy  Return to clinic based upon surgery pathology and Oncotype DX test result   Anemia in chronic kidney disease Hemoglobin stable.   Orders Placed This Encounter  Procedures   Comprehensive metabolic panel    Standing Status:   Future    Number of Occurrences:   1    Standing Expiration Date:   01/03/2024   CBC with Differential/Platelet    Standing Status:   Future    Number of Occurrences:   1    Standing Expiration Date:   01/03/2024   Cancer antigen 15-3    Standing Status:   Future    Number of Occurrences:   1    Standing Expiration Date:   01/03/2024   Cancer antigen 27.29    Standing Status:   Future    Number of Occurrences:   1    Standing Expiration Date:   01/03/2024   Follicle stimulating hormone    Standing Status:   Future    Number of Occurrences:   1    Standing Expiration Date:    01/03/2024   Estradiol    Standing Status:   Future    Number of Occurrences:   1    Standing Expiration Date:   01/03/2024   Ambulatory referral to Genetics    Referral Priority:   Routine    Referral Type:   Consultation    Referral Reason:   Specialty Services Required    Number of Visits Requested:   1   Follow-up to be determined. All questions were answered. The patient knows to call the clinic with any problems, questions or concerns.  Rickard Patience, MD, PhD [facility] Hematology Oncology 01/03/2023    HISTORY OF PRESENTING ILLNESS:  Felicia Butler 62 y.o. female presents to establish care for right breast cancer I have reviewed her chart and materials related to her cancer extensively and collaborated history with the patient. Summary of oncologic history is as follows: Oncology History  Invasive carcinoma of breast (HCC)  12/27/2022 Initial Diagnosis   Invasive carcinoma of breast (HCC)  Breast, right, needle core biopsy, 10 ocl, 7cmfn, mass - INVASIVE MAMMARY CARCINOMA WITH TUBULAR FEATURES, SEE NOTE - DUCTAL CARCINOMA IN SITU (DCIS): PRESENT, FOCAL, GRADE 2 - TUBULE FORMATION: SCORE 1 - NUCLEAR PLEOMORPHISM: SCORE 2 - MITOTIC COUNT: SCORE 1 - TOTAL SCORE: 4 - OVERALL GRADE: 1 - LYMPHOVASCULAR INVASION: NOT IDENTIFIED - CANCER LENGTH: 0.6 CM - CALCIFICATIONS: PRESENT, FOCAL  ER 100%+, PR 100%, HER2 negative.     Menarche  at age of 48 First live birth at age of 70 OCP use: Less than 5 years History of hysterectomy: 42s. Menopausal status: Probably postmenopausal History of HRT use: No History of chest radiation: No Number of previous breast biopsies: 2014 bilateral breast biopsy, papillomas. Family history: No breast cancer.  Mother endometrial cancer, maternal cousin pancreatic cancer  Patient has ESRD on hemodialysis. MEDICAL HISTORY:  Past Medical History:  Diagnosis Date   Anemia    Chronic kidney disease    Dialysis patient (HCC)    M-W-F   GERD  (gastroesophageal reflux disease)    Gout    Hernia    Hyperlipidemia    Hypertension    Hypothyroidism    Moderate pulmonary hypertension (HCC)    TB lung, latent    Thyroid disease     SURGICAL HISTORY: Past Surgical History:  Procedure Laterality Date   A/V FISTULAGRAM N/A 10/10/2022   Procedure: A/V Fistulagram;  Surgeon: Annice Needy, MD;  Location: [facility]VAS[facility];  Service: Cardiovascular;  Laterality: N/A;   A/V FISTULAGRAM N/A 02/01/2022   Procedure: A/V Fistulagram;  Surgeon: Renford Dills, MD;  Location: [facility];  Service: Cardiovascular;  Laterality: N/A;   A/V FISTULAGRAM Left 11/08/2022   Procedure: A/V Fistulagram;  Surgeon: Renford Dills, MD;  Location: [facility];  Service: Cardiovascular;  Laterality: Left;   AV FISTULA PLACEMENT Left 01/18/2017   Procedure: ARTERIOVENOUS (AV) FISTULA CREATION ( BRACHIAL CEPHALIC );  Surgeon: Renford Dills, MD;  Location: [facility] OR[facility];  Service: Vascular;  Laterality: Left;   BREAST BIOPSY Right 04/30/2013   intraductal papilloma   BREAST BIOPSY Right 04/30/2013   cyst aspiration   BREAST BIOPSY Right 05/27/2013   subareolar duct excision   BREAST BIOPSY Left 05/27/2013   subareolar duct excision   BREAST BIOPSY Left 08/11/2016   benign   BREAST BIOPSY Right 12/27/2022   [place] bx/ ribbon clip/ path pending   BREAST BIOPSY Right 12/27/2022   [place] RT BREAST BX W LOC DEV 1ST LESION IMG BX SPEC US GUIDE 12/27/2022 [facility]   COLONOSCOPY  06/28/2018   DIALYSIS/PERMA CATHETER INSERTION N/A 02/01/2022   Procedure: DIALYSIS/PERMA CATHETER INSERTION;  Surgeon: Renford Dills, MD;  Location: [facility];  Service: Cardiovascular;  Laterality: N/A;   DIALYSIS/PERMA CATHETER REMOVAL N/A 07/28/2022   Procedure: DIALYSIS/PERMA CATHETER REMOVAL;  Surgeon: Annice Needy, MD;  Location: [facility];  Service: Cardiovascular;  Laterality: N/A;   HERNIA REPAIR  1960's   PARTIAL  HYSTERECTOMY     1980's   PERIPHERAL VASCULAR THROMBECTOMY Left 04/18/2017   Procedure: PERIPHERAL VASCULAR THROMBECTOMY;  Surgeon: Renford Dills, MD;  Location: [facility];  Service: Cardiovascular;  Laterality: Left;   REVISION OF ARTERIOVENOUS GORETEX GRAFT Left 10/09/2022   Procedure: REVISION OF ARTERIOVENOUS FISTULA;  Surgeon: Nada Libman, MD;  Location: [facility];  Service: Vascular;  Laterality: Left;   REVISON OF ARTERIOVENOUS FISTULA Left 05/06/2022   Procedure: REVISON OF ARTERIOVENOUS FISTULA (BRACHIAL CEPHALIC);  Surgeon: Renford Dills, MD;  Location: [facility];  Service: Vascular;  Laterality: Left;    SOCIAL HISTORY: Social History   Socioeconomic History   Marital status: Divorced    Spouse name: Not on file   Number of children: 1   Years of education: Not on file   Highest education level: Not on file  Occupational History   Not on file  Tobacco Use   Smoking status:  Former    Current packs/day: 0.00    Types: Cigarettes    Start date: 05/30/1978    Quit date: 05/31/1983    Years since quitting: 39.6   Smokeless tobacco: Never  Vaping Use   Vaping status: Never Used  Substance and Sexual Activity   Alcohol use: No   Drug use: No   Sexual activity: Not on file  Other Topics Concern   Not on file  Social History Narrative   Lives with sister and son   Social Determinants of Health   Financial Resource Strain: Not on file  Food Insecurity: No Food Insecurity (01/03/2023)   Hunger Vital Sign    Worried About Running Out of Food in the Last Year: Never true    Ran Out of Food in the Last Year: Never true  Transportation Needs: No Transportation Needs (01/03/2023)   PRAPARE - Administrator, Civil Service (Medical): No    Lack of Transportation (Non-Medical): No  Recent Concern: Transportation Needs - Unmet Transportation Needs (10/09/2022)   PRAPARE - Administrator, Civil Service (Medical): Yes    Lack of  Transportation (Non-Medical): Yes  Physical Activity: Not on file  Stress: Not on file  Social Connections: Not on file  Intimate Partner Violence: Not At Risk (01/03/2023)   Humiliation, Afraid, Rape, and Kick questionnaire    Fear of Current or Ex-Partner: No    Emotionally Abused: No    Physically Abused: No    Sexually Abused: No    FAMILY HISTORY: Family History  Problem Relation Age of Onset   Uterine cancer Mother    Hypertension Father    Diabetes Father    Stroke Father    Kidney failure Father    Pancreatic cancer Cousin    Cancer Cousin    Breast cancer Neg Hx     ALLERGIES:  has No Known Allergies.  MEDICATIONS:  Current Outpatient Medications  Medication Sig Dispense Refill   acetaminophen (TYLENOL) 500 MG tablet Take 1,000 mg by mouth every 6 (six) hours as needed.     amLODipine (NORVASC) 10 MG tablet Take 10 mg by mouth daily.     atorvastatin (LIPITOR) 80 MG tablet Take 80 mg by mouth at bedtime.     AURYXIA 1 GM 210 MG(Fe) tablet Take 630 mg by mouth 3 (three) times daily.     cinacalcet (SENSIPAR) 30 MG tablet Take 30 mg by mouth daily.     famotidine (PEPCID) 20 MG tablet Take 1 tablet (20 mg total) by mouth daily. 30 tablet 3   hydrALAZINE (APRESOLINE) 50 MG tablet Take 50 mg by mouth 3 (three) times daily.     levothyroxine (SYNTHROID, LEVOTHROID) 75 MCG tablet Take 75 mcg by mouth daily before breakfast.     losartan (COZAAR) 100 MG tablet Take 100 mg by mouth daily.     Methoxy PEG-Epoetin Beta (MIRCERA IJ) Mircera     No current facility-administered medications for this visit.    Review of Systems  Constitutional:  Negative for appetite change, chills, fatigue and fever.  HENT:   Negative for hearing loss and voice change.   Eyes:  Negative for eye problems.  Respiratory:  Negative for chest tightness and cough.   Cardiovascular:  Negative for chest pain.  Gastrointestinal:  Negative for abdominal distention, abdominal pain and blood in stool.   Endocrine: Negative for hot flashes.  Genitourinary:  Negative for difficulty urinating and frequency.   Musculoskeletal:  Negative  for arthralgias.  Skin:  Negative for itching and rash.  Neurological:  Negative for extremity weakness.  Hematological:  Negative for adenopathy.  Psychiatric/Behavioral:  Negative for confusion.      PHYSICAL EXAMINATION: ECOG PERFORMANCE STATUS: 0 - Asymptomatic  Vitals:   01/03/23 1124  BP: (!) 176/80  Pulse: 73  Resp: 18  Temp: (!) 97 F (36.1 C)   Filed Weights   01/03/23 1124  Weight: 143 lb (64.9 kg)    Physical Exam Constitutional:      General: She is not in acute distress.    Appearance: She is not diaphoretic.  HENT:     Head: Normocephalic and atraumatic.     Nose: Nose normal.     Mouth/Throat:     Pharynx: No oropharyngeal exudate.  Eyes:     General: No scleral icterus.    Pupils: Pupils are equal, round, and reactive to light.  Cardiovascular:     Rate and Rhythm: Normal rate and regular rhythm.     Heart sounds: No murmur heard. Pulmonary:     Effort: Pulmonary effort is normal. No respiratory distress.     Breath sounds: No rales.  Chest:     Chest wall: No tenderness.  Abdominal:     General: There is no distension.     Palpations: Abdomen is soft.     Tenderness: There is no abdominal tenderness.  Musculoskeletal:        General: Normal range of motion.     Cervical back: Normal range of motion and neck supple.  Skin:    General: Skin is warm and dry.     Findings: No erythema.  Neurological:     Mental Status: She is alert and oriented to person, place, and time.     Cranial Nerves: No cranial nerve deficit.     Motor: No abnormal muscle tone.     Coordination: Coordination normal.  Psychiatric:        Mood and Affect: Affect normal.   Breast exam was performed in seated and lying down position. Patient is status post right breast biopsy with focal ecchymosis/tissue swelling. No palpable breast  masses bilaterally.  No palpable axillary adenopathy bilaterally.   LABORATORY DATA:  I have reviewed the data as listed    Latest Ref Rng & Units 01/03/2023   11:56 AM 10/10/2022    5:09 AM 10/09/2022    7:26 AM  CBC  WBC 4.0 - 10.5 K/uL 5.3  4.5  4.9   Hemoglobin 12.0 - 15.0 g/dL 62.1  9.7  30.8   Hematocrit 36.0 - 46.0 % 32.8  29.8  31.6   Platelets 150 - 400 K/uL 141  104  94       Latest Ref Rng & Units 01/03/2023   11:56 AM 10/10/2022    5:09 AM 10/09/2022    7:26 AM  CMP  Glucose 70 - 99 mg/dL 75  657  95   BUN 8 - 23 mg/dL 49  48  37   Creatinine 0.44 - 1.00 mg/dL 8.46  9.62  9.52   Sodium 135 - 145 mmol/L 138  137  138   Potassium 3.5 - 5.1 mmol/L 3.9  4.0  2.9   Chloride 98 - 111 mmol/L 95  96  96   CO2 22 - 32 mmol/L 29  27  27    Calcium 8.9 - 10.3 mg/dL 8.6  8.2  8.4   Total Protein 6.5 - 8.1 g/dL 7.8  6.7  7.5   Total Bilirubin 0.3 - 1.2 mg/dL 0.9  1.0  1.3   Alkaline Phos 38 - 126 U/L 213  164  182   AST 15 - 41 U/L 25  35  29   ALT 0 - 44 U/L 16  18  18       RADIOGRAPHIC STUDIES: I have personally reviewed the radiological images as listed and agreed with the findings in the report. MM 3D DIAGNOSTIC MAMMOGRAM UNILATERAL RIGHT BREAST  Addendum Date: 01/03/2023   ADDENDUM REPORT: 01/03/2023 13:15 ADDENDUM: Addendum to state that targeted right axillary ultrasound was performed on 12/13/2022, demonstrating morphologically benign lymph nodes. No right axillary lymphadenopathy. Electronically Signed   By: Jacob Moores M.D.   On: 01/03/2023 13:15   Result Date: 01/03/2023 CLINICAL DATA:  Screening recall for possible right breast distortion. Patient is status post subareolar ductal excision in December 2014. EXAM: DIGITAL DIAGNOSTIC UNILATERAL RIGHT MAMMOGRAM WITH TOMOSYNTHESIS AND CAD; ULTRASOUND RIGHT BREAST LIMITED TECHNIQUE: Right digital diagnostic mammography and breast tomosynthesis was performed. The images were evaluated with computer-aided detection. ; Targeted  ultrasound examination of the right breast was performed COMPARISON:  Previous exam(s). ACR Breast Density Category c: The breasts are heterogeneously dense, which may obscure small masses. FINDINGS: Spot tomosynthesis views demonstrate persistent architectural distortion in the upper-outer right breast middle depth (spot CC image 15/34, spot MLO 2 of 2 image 23/34). This corresponds with the screening mammogram finding and is not definitely stable compared to prior exams. Additionally, on physical exam, no visible scar is noted in this area. No additional suspicious mass, calcification, or other findings are identified in the right breast. Targeted right breast ultrasound was performed. At 10 o'clock 7 cm from the nipple, there is an irregular hypoechoic mass with internal vascularity. It measures 10 x 9 x 9 mm. This corresponds with the architectural distortion seen on mammogram. IMPRESSION: Right breast 10 mm irregular mass in the 10 o'clock position is suspicious for malignancy. Recommend further assessment with ultrasound-guided biopsy. RECOMMENDATION: Right breast ultrasound-guided biopsy (1 site). I have discussed the findings and recommendations with the patient. If applicable, a reminder letter will be sent to the patient regarding the next appointment. BI-RADS CATEGORY  4: Suspicious. Electronically Signed: By: Jacob Moores M.D. On: 12/13/2022 14:30   [place] LIMITED ULTRASOUND INCLUDING AXILLA RIGHT BREAST  Addendum Date: 01/03/2023   ADDENDUM REPORT: 01/03/2023 13:15 ADDENDUM: Addendum to state that targeted right axillary ultrasound was performed on 12/13/2022, demonstrating morphologically benign lymph nodes. No right axillary lymphadenopathy. Electronically Signed   By: Jacob Moores M.D.   On: 01/03/2023 13:15   Result Date: 01/03/2023 CLINICAL DATA:  Screening recall for possible right breast distortion. Patient is status post subareolar ductal excision in December 2014. EXAM: DIGITAL  DIAGNOSTIC UNILATERAL RIGHT MAMMOGRAM WITH TOMOSYNTHESIS AND CAD; ULTRASOUND RIGHT BREAST LIMITED TECHNIQUE: Right digital diagnostic mammography and breast tomosynthesis was performed. The images were evaluated with computer-aided detection. ; Targeted ultrasound examination of the right breast was performed COMPARISON:  Previous exam(s). ACR Breast Density Category c: The breasts are heterogeneously dense, which may obscure small masses. FINDINGS: Spot tomosynthesis views demonstrate persistent architectural distortion in the upper-outer right breast middle depth (spot CC image 15/34, spot MLO 2 of 2 image 23/34). This corresponds with the screening mammogram finding and is not definitely stable compared to prior exams. Additionally, on physical exam, no visible scar is noted in this area. No additional suspicious mass, calcification, or other findings are identified in the right breast.  Targeted right breast ultrasound was performed. At 10 o'clock 7 cm from the nipple, there is an irregular hypoechoic mass with internal vascularity. It measures 10 x 9 x 9 mm. This corresponds with the architectural distortion seen on mammogram. IMPRESSION: Right breast 10 mm irregular mass in the 10 o'clock position is suspicious for malignancy. Recommend further assessment with ultrasound-guided biopsy. RECOMMENDATION: Right breast ultrasound-guided biopsy (1 site). I have discussed the findings and recommendations with the patient. If applicable, a reminder letter will be sent to the patient regarding the next appointment. BI-RADS CATEGORY  4: Suspicious. Electronically Signed: By: Jacob Moores M.D. On: 12/13/2022 14:30   MM CLIP PLACEMENT RIGHT  Result Date: 12/27/2022 CLINICAL DATA:  Patient status post ultrasound biopsy right breast mass EXAM: 3D DIAGNOSTIC RIGHT MAMMOGRAM POST ULTRASOUND BIOPSY COMPARISON:  Previous exam(s). FINDINGS: 3D Mammographic images were obtained following ultrasound guided biopsy of right  breast mass. The biopsy marking clip is in expected position at the site of biopsy. IMPRESSION: Appropriate positioning of the ribbon shaped biopsy marking clip at the site of biopsy in the right breast 10 o'clock position. Final Assessment: Post Procedure Mammograms for Marker Placement Electronically Signed   By: Annia Belt M.D.   On: 12/27/2022 14:00   [place] RT BREAST BX W LOC DEV 1ST LESION IMG BX SPEC US GUIDE  Result Date: 12/27/2022 CLINICAL DATA:  Indeterminate right breast mass 10 o'clock position. EXAM: ULTRASOUND GUIDED RIGHT BREAST CORE NEEDLE BIOPSY COMPARISON:  Previous exam(s). PROCEDURE: I met with the patient and we discussed the procedure of ultrasound-guided biopsy, including benefits and alternatives. We discussed the high likelihood of a successful procedure. We discussed the risks of the procedure, including infection, bleeding, tissue injury, clip migration, and inadequate sampling. Informed written consent was given. The usual time-out protocol was performed immediately prior to the procedure. Lesion quadrant: Upper outer quadrant Using sterile technique and 1% Lidocaine as local anesthetic, under direct ultrasound visualization, a 14 gauge spring-loaded device was used to perform biopsy of right breast mass 10 o'clock position using a lateral approach. At the conclusion of the procedure ribbon shaped tissue marker clip was deployed into the biopsy cavity. Follow up 2 view mammogram was performed and dictated separately. IMPRESSION: Ultrasound guided biopsy of right breast mass 10 o'clock position. No apparent complications. Electronically Signed   By: Annia Belt M.D.   On: 12/27/2022 13:45   MM 3D SCREENING MAMMOGRAM BILATERAL BREAST  Result Date: 12/07/2022 CLINICAL DATA:  Screening. EXAM: DIGITAL SCREENING BILATERAL MAMMOGRAM WITH TOMOSYNTHESIS AND CAD TECHNIQUE: Bilateral screening digital craniocaudal and mediolateral oblique mammograms were obtained. Bilateral screening digital  breast tomosynthesis was performed. The images were evaluated with computer-aided detection. COMPARISON:  Previous exam(s). ACR Breast Density Category c: The breasts are heterogeneously dense, which may obscure small masses. FINDINGS: In the right breast, possible distortion warrants further evaluation. In the left breast, no findings suspicious for malignancy. IMPRESSION: Further evaluation is suggested for possible distortion in the right breast. RECOMMENDATION: Diagnostic mammogram and possibly ultrasound of the right breast. (Code:[id number]-R-[id number]) The patient will be contacted regarding the findings, and additional imaging will be scheduled. BI-RADS CATEGORY  0: Incomplete: Need additional imaging evaluation. Electronically Signed   By: Sande Brothers M.D.   On: 12/07/2022 08:59

## 2023-01-03 NOTE — Assessment & Plan Note (Signed)
Hemoglobin stable 

## 2023-01-03 NOTE — Telephone Encounter (Signed)
Let Felicia Butler know that the radiologist did check the lymph nodes on ultrasound and will addend the report so she will not need to have a repeat axillary ultrasound.

## 2023-01-03 NOTE — Progress Notes (Signed)
Accompanied patient and family to initial medical oncology appointment.   Reviewed Breast Cancer treatment handbook.   Care plan summary given to patient.   Reviewed outreach programs and cancer center services.   

## 2023-01-03 NOTE — Assessment & Plan Note (Signed)
Pathology and radiology counseling: Discussed with the patient, the details of pathology including the type of breast cancer,the clinical staging, the significance of ER, PR and HER-2/neu receptors and the implications for treatment. After reviewing the pathology in detail, we proceeded to discuss the different treatment options between surgery, radiation, chemotherapy, antiestrogen therapies.  cT1b cN0  Treatment plan: 1.  Recommend breast conserving surgery with sentinel lymph node biopsy 2. Oncotype DX testing on the surgical specimen to determine if she would benefit from adjuvant chemo 3.  Adjuvant radiation 4.  Adjuvant antiestrogen therapy  Return to clinic based upon surgery pathology and Oncotype DX test result

## 2023-01-03 NOTE — Progress Notes (Signed)
Pt here for follow up. Pt states she has dialysis on M, W & Fridays (early morning)

## 2023-01-05 ENCOUNTER — Ambulatory Visit: Payer: 59 | Admitting: Surgery

## 2023-01-09 ENCOUNTER — Other Ambulatory Visit: Payer: Self-pay

## 2023-01-09 ENCOUNTER — Encounter: Payer: Self-pay | Admitting: Gastroenterology

## 2023-01-09 ENCOUNTER — Telehealth: Payer: Self-pay

## 2023-01-09 DIAGNOSIS — Z86010 Personal history of colonic polyps: Secondary | ICD-10-CM

## 2023-01-09 DIAGNOSIS — Z1211 Encounter for screening for malignant neoplasm of colon: Secondary | ICD-10-CM

## 2023-01-09 NOTE — Telephone Encounter (Signed)
Contacted [facility] to inquire on patients rx that was sent over on 11/21/22.  They said they don't know who patient spoke to but they have the prep.  I've asked them to call patient to pick up this prep as soon as it is filled.  She has been rescheduled to 02/16/23.  Thanks,  [place], [place]

## 2023-01-09 NOTE — Telephone Encounter (Signed)
Patient needs to reschedule colonoscopy because she states she did not receive her prep. Her colonoscopy is schedule for tomorrow.

## 2023-01-09 NOTE — H&P (View-Only) (Signed)
Patient ID: Felicia Butler, female   DOB: 06-Feb-1961, 62 y.o.   MRN: [id number]  Chief Complaint: Right breast cancer  History of Present Illness Felicia Butler is a 62 y.o. female with recently biopsied palpable lump, of mammographic abnormality consistent with ultrasound 1 cm mass in upper outer quadrant right breast.  She denies any history of nipple discharge overlying dermal changes or breast pain.  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  Ultrasound negative for pathologic lymphadenopathy prior to biopsy.  She began menstruating at age of 72 she is gravida 1 para 1, having her first pregnancy at the age of 61.  She has had a hysterectomy and no family history of breast cancer.  Past Medical History Past Medical History:  Diagnosis Date   Anemia    Chronic kidney disease    D[facility] patient [facility] [address])    M-W-F   GERD (gastroesophageal reflux disease)    Gout    Hernia    Hyperlipidemia    Hypertension    Hypothyroidism    Moderate pulmonary hypertension (HCC)    TB lung, latent    Thyroid disease       Past Surgical History:  Procedure Laterality Date   A/V FISTULAGRAM N/A 10/10/2022   Procedure: A/V Fistulagram;  Surgeon: Annice Needy, MD;  Location: [facility];  Service: Cardiovascular;  Laterality: N/A;   A/V FISTULAGRAM N/A 02/01/2022   Procedure: A/V Fistulagram;  Surgeon: Renford Dills, MD;  Location: [facility];  Service: Cardiovascular;  Laterality: N/A;   A/V FISTULAGRAM Left 11/08/2022   Procedure: A/V Fistulagram;  Surgeon: Renford Dills, MD;  Location: [facility];  Service: Cardiovascular;  Laterality: Left;   ABDOMINAL HYSTERECTOMY     AV FISTULA PLACEMENT Left 01/18/2017   Procedure: ARTERIOVENOUS (AV) FISTULA CREATION ( BRACHIAL CEPHALIC );  Surgeon: Renford Dills, MD;  Location: [facility];  Service: Vascular;  Laterality: Left;   BREAST BIOPSY Right 04/30/2013    intraductal papilloma   BREAST BIOPSY Right 04/30/2013   cyst aspiration   BREAST BIOPSY Right 05/27/2013   subareolar duct excision   BREAST BIOPSY Left 05/27/2013   subareolar duct excision   BREAST BIOPSY Left 08/11/2016   benign   BREAST BIOPSY Right 12/27/2022   [place] bx/ ribbon clip/ path pending   BREAST BIOPSY Right 12/27/2022   [place] RT BREAST BX W LOC DEV 1ST LESION IMG BX SPEC US GUIDE 12/27/2022 [facility]   COLONOSCOPY  06/28/2018   DIALYSIS/PERMA CATHETER INSERTION N/A 02/01/2022   Procedure: DIALYSIS/PERMA CATHETER INSERTION;  Surgeon: Renford Dills, MD;  Location: [facility];  Service: Cardiovascular;  Laterality: N/A;   DIALYSIS/PERMA CATHETER REMOVAL N/A 07/28/2022   Procedure: DIALYSIS/PERMA CATHETER REMOVAL;  Surgeon: Annice Needy, MD;  Location: [facility];  Service: Cardiovascular;  Laterality: N/A;   HERNIA REPAIR  1960's   PARTIAL HYSTERECTOMY     1980's   PERIPHERAL VASCULAR THROMBECTOMY Left 04/18/2017   Procedure: PERIPHERAL VASCULAR THROMBECTOMY;  Surgeon: Renford Dills, MD;  Location: [facility];  Service: Cardiovascular;  Laterality: Left;   REVISION OF ARTERIOVENOUS GORETEX GRAFT Left 10/09/2022   Procedure: REVISION OF ARTERIOVENOUS FISTULA;  Surgeon: Nada Libman, MD;  Location: [facility];  Service: Vascular;  Laterality: Left;   REVISON OF ARTERIOVENOUS FISTULA Left 05/06/2022   Procedure: REVISON OF ARTERIOVENOUS FISTULA (BRACHIAL CEPHALIC);  Surgeon: Renford Dills, MD;  Location: [facility];  Service: Vascular;  Laterality: Left;    No Known Allergies  Current Outpatient Medications  Medication Sig Dispense Refill   acetaminophen (TYLENOL) 500 MG tablet Take 1,000 mg by mouth every 6 (six) hours as needed.     amLODipine (NORVASC) 10 MG tablet Take 10 mg by mouth daily.     atorvastatin (LIPITOR) 80 MG tablet Take 80 mg by mouth at bedtime.     AURYXIA 1 GM 210 MG(Fe) tablet Take 630 mg by mouth 3  (three) times daily.     cinacalcet (SENSIPAR) 30 MG tablet Take 30 mg by mouth daily.     famotidine (PEPCID) 20 MG tablet Take 1 tablet (20 mg total) by mouth daily. 30 tablet 3   hydrALAZINE (APRESOLINE) 50 MG tablet Take 50 mg by mouth 3 (three) times daily.     levothyroxine (SYNTHROID, LEVOTHROID) 75 MCG tablet Take 75 mcg by mouth daily before breakfast.     lidocaine-prilocaine (EMLA) cream Apply to the areola and cover with plastic wrap one hour prior to leaving for surgery. 5 g 0   losartan (COZAAR) 100 MG tablet Take 100 mg by mouth daily.     Methoxy PEG-Epoetin Beta (MIRCERA IJ) Mircera     No current facility-administered medications for this visit.    Family History Family History  Problem Relation Age of Onset   Uterine cancer Mother    Hypertension Father    Diabetes Father    Stroke Father    Kidney failure Father    Pancreatic cancer Cousin    Cancer Cousin    Breast cancer Neg Hx       Social History Social History   Tobacco Use   Smoking status: Former    Current packs/day: 0.00    Types: Cigarettes    Start date: 05/30/1978    Quit date: 05/31/1983    Years since quitting: 39.6    Passive exposure: Past   Smokeless tobacco: Never  Vaping Use   Vaping status: Never Used  Substance Use Topics   Alcohol use: No   Drug use: No        Review of Systems  Constitutional: Negative.   HENT:  Positive for tinnitus.   Eyes: Negative.   Respiratory: Negative.    Cardiovascular: Negative.   Gastrointestinal:  Positive for heartburn.  Genitourinary: Negative.   Skin: Negative.   Neurological: Negative.   Psychiatric/Behavioral: Negative.      Physical Exam Blood pressure (!) 154/80, pulse 74, temperature 98 F (36.7 C), height 5\' 1"  (1.549 m), weight 138 lb 6.4 oz (62.8 kg), last menstrual period 11/18/1996, SpO2 98%. Last Weight  Most recent update: 01/10/2023  2:43 PM    Weight  62.8 kg (138 lb 6.4 oz)             CONSTITUTIONAL: Well  developed, and nourished, marked evidence of weight loss, appropriately responsive and aware without distress.   EYES: Sclera non-icteric.   EARS, NOSE, MOUTH AND THROAT: Mask worn.  The oropharynx is clear. Oral mucosa is pink and moist.    Hearing is intact to voice.  NECK: Trachea is midline, and there is no jugular venous distension.  LYMPH NODES:  Lymph nodes in the neck are not appreciated. RESPIRATORY:  Lungs are clear, and breath sounds are equal bilaterally.  Normal respiratory effort without pathologic use of accessory muscles. CARDIOVASCULAR: Heart is regular in rate and rhythm.   Well perfused.  GI: The abdomen is  soft, nontender, and mildly distended. There were no palpable masses.    GU: Caryl Lyn present as chaperone.  There is a readily palpable mass at the area of concern consistent with deep recently biopsied lesion.  It is slightly larger than a centimeter which makes me somewhat suspicious that this may not be the lesion in question, however there is no other palpable lesion in the area and there is an relative paucity of fat which convinces me that this must be it.  She is thin enough to palpate lymph nodes in both axilla, neither are remarkably suspicious, she has her dialysis access in her left arm.  MUSCULOSKELETAL:  Symmetrical muscle tone appreciated in all four extremities.    SKIN: Skin turgor is normal. No pathologic skin lesions appreciated.  NEUROLOGIC:  Motor and sensation appear grossly normal.  Cranial nerves are grossly without defect. PSYCH:  Alert and oriented to person, place and time. Affect is flat, though appropriate for situation.  Data Reviewed I have personally reviewed what is currently available of the patient's imaging, recent labs and medical records.   Labs:     Latest Ref Rng & Units 01/03/2023   11:56 AM 10/10/2022    5:09 AM 10/09/2022    7:26 AM  CBC  WBC 4.0 - 10.5 K/uL 5.3  4.5  4.9   Hemoglobin 12.0 - 15.0 g/dL 16.1  9.7  09.6    Hematocrit 36.0 - 46.0 % 32.8  29.8  31.6   Platelets 150 - 400 K/uL 141  104  94       Latest Ref Rng & Units 01/03/2023   11:56 AM 10/10/2022    5:09 AM 10/09/2022    7:26 AM  CMP  Glucose 70 - 99 mg/dL 75  045  95   BUN 8 - 23 mg/dL 49  48  37   Creatinine 0.44 - 1.00 mg/dL 4.09  8.11  9.14   Sodium 135 - 145 mmol/L 138  137  138   Potassium 3.5 - 5.1 mmol/L 3.9  4.0  2.9   Chloride 98 - 111 mmol/L 95  96  96   CO2 22 - 32 mmol/L 29  27  27    Calcium 8.9 - 10.3 mg/dL 8.6  8.2  8.4   Total Protein 6.5 - 8.1 g/dL 7.8  6.7  7.5   Total Bilirubin 0.3 - 1.2 mg/dL 0.9  1.0  1.3   Alkaline Phos 38 - 126 U/L 213  164  182   AST 15 - 41 U/L 25  35  29   ALT 0 - 44 U/L 16  18  18     REPORT OF SURGICAL PATHOLOGY Addendum: Breast Biomarker Results FINAL DIAGNOSIS Diagnosis Breast, right, needle core biopsy, 10 ocl, 7cmfn, mass - INVASIVE MAMMARY CARCINOMA WITH TUBULAR FEATURES, SEE NOTE - DUCTAL CARCINOMA IN SITU (DCIS): PRESENT, FOCAL, GRADE 2 - TUBULE FORMATION: SCORE 1 - NUCLEAR PLEOMORPHISM: SCORE 2 - MITOTIC COUNT: SCORE 1 - TOTAL SCORE: 4 - OVERALL GRADE: 1 - LYMPHOVASCULAR INVASION: NOT IDENTIFIED - CANCER LENGTH: 0.6 CM - CALCIFICATIONS: PRESENT, FOCAL Diagnosis Note Immunohistochemical staining for myoepithelial markers (calponin, smooth muscle myosin, and p63) are performed on block 1A. The myoepithelial markers are focally lost which is consistent with the above diagnosis. Dr. Oneita Kras reviewed the case and concurs with the interpretation. A breast prognostic profile (ER, PR, and HER2) is pending and will be reported in an addendum. Randa Lynn, RN was notified via secure chat on  12/29/2022. Leonarda Salon M.D. Pathologist, Electronic Signature (Case signed 12/29/2022) Specimen Gross and Clinical Information Specimen Comment TIF: 1:35 pm, CIT < 30 sec; mass Specimen(s) Obtained: Breast, right, needle core biopsy, 10 ocl, 7cmfn, mass 1 of 3  Specimen Clinical  Information C/F IDC Gross Received in formalin labeled with the patient's name and right breast 10 o'clock, 7 cm from nipple, and consists of four cores of fibroadipose tissue, ranging from 0.5 x 0.2 x 0.1 cm to 1.6 x 0.2 x 0.1 cm. The specimen is entirely submitted in one cassette. TIF 1:35 p.m. on 12/27/22. CIT less than 30 secs. (KL:gt, 12/28/22) Stain(s) used in Diagnosis: The following stain(s) were used in diagnosing the case: ER-ACIS, PR-ACIS, Her2 by IHC. The control(s) stained appropriately. ADDITIONAL INFORMATION: 1A) Breast, right, needle core biopsy, 10 ocl, 7 cmfn, mass PROGNOSTIC INDICATORS Results: IMMUNOHISTOCHEMICAL AND MORPHOMETRIC ANALYSIS PERFORMED MANUALLY The tumor cells are NEGATIVE for Her2 (1+). Estrogen Receptor: 100%, POSITIVE, STRONG STAINING INTENSITY Progesterone Receptor: 100%, POSITIVE, STRONG STAINING INTENSITY REFERENCE RANGE ESTROGEN RECEPTOR NEGATIVE 0% POSITIVE =>1% REFERENCE RANGE PROGESTERONE RECEPTOR NEGATIVE 0% POSITIVE =>1% All controls stained appropriately Jerene Bears MD Pathologist, Electronic Signature ( Signed 12/30/2022)   Imaging: Radiological images reviewed:  ADDENDUM REPORT: 01/03/2023 13:15   ADDENDUM: Addendum to state that targeted right axillary ultrasound was performed on 12/13/2022, demonstrating morphologically benign lymph nodes. No right axillary lymphadenopathy.     Electronically Signed   By: Jacob Moores M.D.   On: 01/03/2023 13:15    Addended by Jacob Moores, MD on 01/03/2023  3:15 PM    Study Result  Narrative & Impression  CLINICAL DATA:  Screening recall for possible right breast distortion. Patient is status post subareolar ductal excision in December 2014.   EXAM: DIGITAL DIAGNOSTIC UNILATERAL RIGHT MAMMOGRAM WITH TOMOSYNTHESIS AND CAD; ULTRASOUND RIGHT BREAST LIMITED   TECHNIQUE: Right digital diagnostic mammography and breast tomosynthesis was performed. The images were evaluated  with computer-aided detection. ; Targeted ultrasound examination of the right breast was performed   COMPARISON:  Previous exam(s).   ACR Breast Density Category c: The breasts are heterogeneously dense, which may obscure small masses.   FINDINGS: Spot tomosynthesis views demonstrate persistent architectural distortion in the upper-outer right breast middle depth (spot CC image 15/34, spot MLO 2 of 2 image 23/34). This corresponds with the screening mammogram finding and is not definitely stable compared to prior exams. Additionally, on physical exam, no visible scar is noted in this area. No additional suspicious mass, calcification, or other findings are identified in the right breast.   Targeted right breast ultrasound was performed. At 10 o'clock 7 cm from the nipple, there is an irregular hypoechoic mass with internal vascularity. It measures 10 x 9 x 9 mm. This corresponds with the architectural distortion seen on mammogram.   IMPRESSION: Right breast 10 mm irregular mass in the 10 o'clock position is suspicious for malignancy. Recommend further assessment with ultrasound-guided biopsy.   RECOMMENDATION: Right breast ultrasound-guided biopsy (1 site).   I have discussed the findings and recommendations with the patient. If applicable, a reminder letter will be sent to the patient regarding the next appointment.   BI-RADS CATEGORY  4: Suspicious.   Electronically Signed: By: Jacob Moores M.D. On: 12/13/2022 14:30    Within last 24 hrs: No results found.  Assessment    Palpable mass consistent with invasive carcinoma of the right breast, located in upper outer quadrant. Patient Active Problem List   Diagnosis Date Noted  Invasive carcinoma of breast (HCC) 01/03/2023   Family history of cancer 01/03/2023   Complication of vascular graft 10/09/2022   Hypercalcemia 06/03/2022   Chronic kidney disease (CKD), stage III (moderate) (HCC) 06/03/2022    Hypertension 06/03/2022   Allergy, unspecified, initial encounter 01/30/2022   Anaphylactic shock, unspecified, initial encounter 01/30/2022   Mixed hyperlipidemia 01/06/2021   Contact with and (suspected) exposure to covid-19 11/06/2020   Encounter for screening for COVID-19 11/06/2020   CAD in native artery 09/10/2020   Complication of vascular access for dialysis 08/19/2020   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   Gout 02/27/2020   Hyperlipidemia 02/27/2020   Hypothyroidism 02/27/2020   Obesity 02/27/2020   TB lung, 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   Other disorders of phosphorus metabolism 11/07/2018   COVID-19 10/15/2018   Hyperkalemia 08/08/2018   Anemia in chronic kidney disease 04/10/2017   Coagulation defect, unspecified (HCC) 04/10/2017   Diarrhea, unspecified 04/10/2017   Encounter for immunization 04/10/2017   End stage renal disease (HCC) 04/10/2017   Fever, unspecified 04/10/2017   Headache, unspecified 04/10/2017   Iron deficiency anemia, unspecified 04/10/2017   Moderate protein-calorie malnutrition (HCC) 04/10/2017   Pain, unspecified 04/10/2017   Pruritus, unspecified 04/10/2017   Secondary hyperparathyroidism of renal origin (HCC) 04/10/2017   Shortness of breath 04/10/2017   Type 2 diabetes mellitus with diabetic chronic kidney disease (HCC) 04/10/2017   Hypertensive chronic kidney disease with stage 1 through stage 4 chronic kidney disease, or unspecified chronic kidney disease 04/10/2017   Chronic renal insufficiency 12/18/2016   Essential hypertension 12/18/2016   Intraductal papilloma of breast 05/16/2013   Proteinuria 10/07/2012    Plan    Will proceed with right breast lumpectomy, sentinel lymph node biopsy right axilla.  I discussed the available options with the patient, and the person who accompanied her. The risk of recurrence is similar between mastectomy and lumpectomy with radiation.  I also discussed that given the small size of the cancer  would recommend localization lumpectomy with radiation to follow.  We engaged in open debate regarding a tag localization process, and elected to go without it, somewhat confident that this palpable lump is the area of concern.  I also discussed that we would need to do a sentinel lymph node biopsy to check the nodes.   Explained to the patient that after her surgical treatment additional treatment will depend on her prognostic indicators and stage.    I discussed risks of bleeding, infection, damage to surrounding tissues, having positive margins, needing further resection, damage to nerves causing arm numbness or difficulty raising arm, causing lymphedema in the arm; as well as anesthesia risks of MI, stroke, prolonged ventilation, pulmonary embolism, thrombosis and even death.   Patient was given the opportunity to ask questions and have them answered.  They would like to proceed with right breast lumpectomy with sentinel lymph node biopsy.     Face-to-face time spent with the patient and accompanying care providers(if present) was 45 minutes, with more than 50% of the time spent counseling, educating, and coordinating care of the patient.    These notes generated with voice recognition software. I apologize for typographical errors.  Campbell Lerner M.D., FACS 01/10/2023, 4:03 PM

## 2023-01-09 NOTE — Progress Notes (Unsigned)
Patient ID: Felicia Butler, female   DOB: 05-26-1961, 62 y.o.   MRN: [id number]  Chief Complaint: Right breast cancer  History of Present Illness Felicia Butler is a 62 y.o. female with recently biopsied palpable lump, of mammographic abnormality consistent with ultrasound 1 cm mass in upper outer quadrant right breast.  She denies any history of nipple discharge overlying dermal changes or breast pain.  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  Ultrasound negative for pathologic lymphadenopathy prior to biopsy.  She began menstruating at age of 80 she is gravida 1 para 1, having her first pregnancy at the age of 27.  She has had a hysterectomy and no family history of breast cancer.  Past Medical History Past Medical History:  Diagnosis Date  . Anemia   . Chronic kidney disease   . Dialysis patient [facility])    M-W-F  . GERD (gastroesophageal reflux disease)   . Gout   . Hernia   . Hyperlipidemia   . Hypertension   . Hypothyroidism   . Moderate pulmonary hypertension (HCC)   . TB lung, latent   . Thyroid disease       Past Surgical History:  Procedure Laterality Date  . A/V FISTULAGRAM N/A 10/10/2022   Procedure: A/V Fistulagram;  Surgeon: Annice Needy, MD;  Location: [facility] INVASIVE CV [facility];  Service: Cardiovascular;  Laterality: N/A;  . A/V FISTULAGRAM N/A 02/01/2022   Procedure: A/V Fistulagram;  Surgeon: Renford Dills, MD;  Location: [facility];  Service: Cardiovascular;  Laterality: N/A;  . A/V FISTULAGRAM Left 11/08/2022   Procedure: A/V Fistulagram;  Surgeon: Renford Dills, MD;  Location: [facility];  Service: Cardiovascular;  Laterality: Left;  . ABDOMINAL HYSTERECTOMY    . AV FISTULA PLACEMENT Left 01/18/2017   Procedure: ARTERIOVENOUS (AV) FISTULA CREATION ( BRACHIAL CEPHALIC );  Surgeon: Renford Dills, MD;  Location: [facility];  Service: Vascular;  Laterality: Left;  . BREAST BIOPSY  Right 04/30/2013   intraductal papilloma  . BREAST BIOPSY Right 04/30/2013   cyst aspiration  . BREAST BIOPSY Right 05/27/2013   subareolar duct excision  . BREAST BIOPSY Left 05/27/2013   subareolar duct excision  . BREAST BIOPSY Left 08/11/2016   benign  . BREAST BIOPSY Right 12/27/2022   [place] bx/ ribbon clip/ path pending  . BREAST BIOPSY Right 12/27/2022   [place] RT BREAST BX W LOC DEV 1ST LESION IMG BX SPEC US GUIDE 12/27/2022 [facility]  . COLONOSCOPY  06/28/2018  . DIALYSIS/PERMA CATHETER INSERTION N/A 02/01/2022   Procedure: DIALYSIS/PERMA CATHETER INSERTION;  Surgeon: Renford Dills, MD;  Location: [facility];  Service: Cardiovascular;  Laterality: N/A;  . DIALYSIS/PERMA CATHETER REMOVAL N/A 07/28/2022   Procedure: DIALYSIS/PERMA CATHETER REMOVAL;  Surgeon: Annice Needy, MD;  Location: [facility];  Service: Cardiovascular;  Laterality: N/A;  . HERNIA REPAIR  1960's  . PARTIAL HYSTERECTOMY     1980's  . PERIPHERAL VASCULAR THROMBECTOMY Left 04/18/2017   Procedure: PERIPHERAL VASCULAR THROMBECTOMY;  Surgeon: Renford Dills, MD;  Location: [facility];  Service: Cardiovascular;  Laterality: Left;  . REVISION OF ARTERIOVENOUS GORETEX GRAFT Left 10/09/2022   Procedure: REVISION OF ARTERIOVENOUS FISTULA;  Surgeon: Nada Libman, MD;  Location: [facility];  Service: Vascular;  Laterality: Left;  . REVISON OF ARTERIOVENOUS FISTULA Left 05/06/2022   Procedure: REVISON OF ARTERIOVENOUS FISTULA (BRACHIAL CEPHALIC);  Surgeon: Renford Dills, MD;  Location: [facility];  Service: Vascular;  Laterality: Left;    No Known Allergies  Current Outpatient Medications  Medication Sig Dispense Refill  . acetaminophen (TYLENOL) 500 MG tablet Take 1,000 mg by mouth every 6 (six) hours as needed.    Marland Kitchen amLODipine (NORVASC) 10 MG tablet Take 10 mg by mouth daily.    Marland Kitchen atorvastatin (LIPITOR) 80 MG tablet Take 80 mg by mouth at bedtime.    Gean Quint 1 GM 210  MG(Fe) tablet Take 630 mg by mouth 3 (three) times daily.    . cinacalcet (SENSIPAR) 30 MG tablet Take 30 mg by mouth daily.    . famotidine (PEPCID) 20 MG tablet Take 1 tablet (20 mg total) by mouth daily. 30 tablet 3  . hydrALAZINE (APRESOLINE) 50 MG tablet Take 50 mg by mouth 3 (three) times daily.    Marland Kitchen levothyroxine (SYNTHROID, LEVOTHROID) 75 MCG tablet Take 75 mcg by mouth daily before breakfast.    . lidocaine-prilocaine (EMLA) cream Apply to the areola and cover with plastic wrap one hour prior to leaving for surgery. 5 g 0  . losartan (COZAAR) 100 MG tablet Take 100 mg by mouth daily.    . Methoxy PEG-Epoetin Beta (MIRCERA IJ) Mircera     No current facility-administered medications for this visit.    Family History Family History  Problem Relation Age of Onset  . Uterine cancer Mother   . Hypertension Father   . Diabetes Father   . Stroke Father   . Kidney failure Father   . Pancreatic cancer Cousin   . Cancer Cousin   . Breast cancer Neg Hx       Social History Social History   Tobacco Use  . Smoking status: Former    Current packs/day: 0.00    Types: Cigarettes    Start date: 05/30/1978    Quit date: 05/31/1983    Years since quitting: 39.6    Passive exposure: Past  . Smokeless tobacco: Never  Vaping Use  . Vaping status: Never Used  Substance Use Topics  . Alcohol use: No  . Drug use: No        Review of Systems  Constitutional: Negative.   HENT:  Positive for tinnitus.   Eyes: Negative.   Respiratory: Negative.    Cardiovascular: Negative.   Gastrointestinal:  Positive for heartburn.  Genitourinary: Negative.   Skin: Negative.   Neurological: Negative.   Psychiatric/Behavioral: Negative.    Blood clot essentially in your scrotum has not a good trade off cassette that will not take a while to go away be more harmful if we leave a little hernia sac behind and normally that much behind but there is a good chance she might end up with what we call a  hydrocele as just an enclosed fluid-filled space that we can deal with the later down the road may be urologist might even do procedure in the office to to remove enough of that lining under under direct numbing medicine here to keep that fluid from persisting in the scrotal area there but the most important thing is to get the bowels about just how would I know her it is it just a lot of times these these fluid-filled things can be just very asymptomatic a lot smaller than what you are dealing with now with the hernia obviously if we particularly placed a partition here where the hole is kept the bowels where they belong keep the bowels out of harm's way then a  lot of this will get back toward normal but it may never fully get back to to the actual normal size everything out of be as far as regressing back may take some time there may be little hiccup with a fluid-filled sac feeling that space for a while with some more we can do about that we will cross that bridge we get to it most of was needed a little of any yeah what what will walk-through step-by-step and her follow-up with he has an out you know after surgery after surgery is done because right now the most important thing is I can get everything that was in your hernia back Worple lungs do not have any difficulty breathing or any not in excruciating pain S most important thing right now is that if you know ideally if I get things back right of the lungs we can all get along the and then were in good shape okay so lets yeah yeah 1 there also is on the left side but not near the aspect we can do both at the same time if you want I probably build appreciate the left side considering a big the right is right now "based upon the the approach I take it whenever he approach it through 4 through 3 incisions of the 3 incisions right appear and were going to come in behind and be looking at these openings looking at making sure all the now what I just did here all  of bowel organ to [place] their goal of bowel to fall back in your belly where prolongs Assessment intestines yeah yeah and fortunately for you you have not gotten stuck there in a desperate way and created a bigger problem I am glad for that but I want to let that happen and its wife and glad you are being proactive in getting 1 after this now rather than having this visit you in the ER and desperation half month to put it off distal if you eat you know how much is bothering you more than I do and that is that is the biggest I think at this point in time the we have known for years that we can watch and wait and what what we can watch and wait because we know that it gets more bothersome as it approaches risk and and you will know essentially what needs to be done should it get more troublesome phoria yeah yeah encounter determine okay I cannot put up with an right now is thinking maybe having done 1213 in November was on the although further yet I was I was thinking I thought her like a month that I here month or okay I thought so I thought I heard it you think I am ordinate Euna again decision sentence just suddenly changed overnight far as I can tell has not got back probably months centimeters overnight and I had at trended I am not I am not trying to dissuade you anything but I think you already got kind of a perspective that this is going to really lay up for a solid month or more I do not really think is can be that bad and I hate to say that because it is almost like Gudena better lived up to my promises and end somewhat out of my control typically when we patch something from the inside as long as we have a good overlap of what were patching that you cannot overwhelm that mesh repair organ be put  mesh over whole as prior this vague number going to have a mesh patch is probably about that leg and I will be just a couple stitches holding it in place and sometimes a lot of your intra-abdominal contents can be,  laying up against that to do that from going through his little incisions in that nice and it if if you did anything good by putting off you you set yourself up for a better opportunity because I think today's techniques like more today's techniques are whole lot better and a lot easier to get over that and then the old-fashioned big the groin cut big sutures the mesh that would put him down there so now I am not trying to move it up to middle October some and again most of [place] your comfort level and I cannot make I cannot make promises I do not want to make promises that that disappoints you can keep out longer Anticipate at the same time to get along fine do not have a lot of pain does not get not that much more bothersome you know there is there is a comfort level of putting it off till you want to get it done okay so I am happy to work with you there but I hate free to think that I got a put this off until this point in time because I am expecting him to be laid up for for 6 weeks afterwards I hope with I hope it is nothing like that neurostatus I can reasonably and depending upon what it is you are anticipating doing afterwards I would like to say that you are lifting ability would be gets reasonable to say within a week to 2 weeks you could you could resume some lifting that is that is that is a pretty bold boast for a person repairing hernias but I think because of the way were approaching things and the fact that you are tissues pretty are pretty strong I am Uniphyl to hold maybe is big around the eye I anticipate you not having good coverage of that yet a year ago I can I can push it back in yeah and and so part of it and now and so I was glad to see it it went back any is easily is a did assess better relief to make it down to read and do the left side later 8 we just will do them both at the same time because it is the same 3 incisions I will I will make any more incisions on your abdomen there was just  coming from behind it got bit [place] on [place] on this side small on that side and take down the drape that is getting blown through those holes that take Dennett draped put mesh and is just skin tissues that they have have has yes from intestines to oh well we have got multiple layers of abdominal wall in that groin area you know how a little aperture on the camera, works with folding other the you got a whole like that and that should have been kind of closed off with that with the muscles but but it is a little GABA gone on the right side and and parietal smaller on your left and and its its up is a combination of like 3 different layers of muscle joining gathering coming into the groin areas so that is that is where the hole is and that is some relief with that heavy had any  other imaging CAT scans or anything because of back 10 years ago I had a real problem I went to the emergency room they Me 3 days 3 days they concerned about how okay arrhythmias games and feels acid reflux last literally I was there Friday Saturday Sunday afternoon and I say they did not really check it again at that point I was about 62 might be having a heart attack at any other CAT scans or anything GI 59 needs any GI visits upper endoscopy is not drinking drink any diet illogical history or system I did years ago but went to her doctor this was 20 years ago and actually turned upside I want things got like a lump seen then after that that back Glyset back in years ago and againis not the comfortable while and got better but I I am not big on taking medicine on the just do not think people to take medicine every day throughout the last always big pharmacy postmedical there manage if you may prescribe another medication for him we will try This is going I think thinking out loud to him, I am, wondering you know you you have developed a pretty good sized hernia in the groin could you have also developed a similar sized hernia in your  diaphragm something on my head where the hiatal hernia yeah that is right thing may minimize the possibility so that is why I have he had a colonoscopy for and lower leg dehydrated to [facility] medicine okay morning, I know that does not bode well with making plans like thank you for doing this with my bourbon from yeah yeah burping problem can be a lot of different things heart hard to put a finger on it specifically I could be think from is from gallbladder disease to hiatal hernia though just plain no acid reflux maybe little maybe little stricturing or narrowing at the bottom of your esophagus yet he said your wife suffering, sent similar issues she had her hip replaced last October and she sees me she lobate skin warm she notes about 5 and 10 pain in which she has had problems but hers partly related to she works part-time stress ever ever take any simethicone or Mylicon is that ever come across you not a drug taker so well I been [employer] and make a bulge alignment the gastric Gas-X and Rolaids seem to work for which she is wondering about starting to have problem with gallbladder evidence at the appointment her right Nechama Guard know that it does not hurt problems.  I do not some soreness of the years I had some soreness here and right yeah I thought just because looking like when he goes but it had not bothered me recently after I had COVID last month last February into February and based on what we go a little he could have some problems aftereffects from COVID from all the point that [place] again there is we are doing "problem as I think it.  The gallbladder I mean week we could look it into you do not plan surgery right away for a minute may be we could start with an ultrasound ultrasound and now is just just to [facility] tested they just put some jelly under this area take a little look its most sensitive for looking for stones got any stone stones or common we do a lot of gallbladder surgery  is a common problem is not something that can countersink up  on yeah sometimes if you have ways of of kind of putting up with some degrees of indigestion some discomfort and stuff like that it comes think about it depends a high wanted to find the the bloating gas belching syndrome yeah that that might be 1 we are looking at things so had is your primary care done any screening things for occult Ketocon I got the sense that we can write well, you know I am I am glad to look deeper look look after things that that might be more helpful there is a lot of relatively common things that she could be suffering with her dealing with and we just need to look a little more aggressively at that some these areas CT scan can tell us a lot I saw 1 Theodis Blaze 2 years ago and yeah I am we will have his whole conversation being recorded here it was not intentional but Dorgan at all pretty much so anyway and allow to happen but I had a CT scan 2 years ago Alexander Bergeron see problem with of course this is not because of the know we want this and   Physical Exam Blood pressure (!) 154/80, pulse 74, temperature 98 F (36.7 C), height 5\' 1"  (1.549 m), weight 138 lb 6.4 oz (62.8 kg), last menstrual period 11/18/1996, SpO2 98%. Last Weight  Most recent update: 01/10/2023  2:43 PM    Weight  62.8 kg (138 lb 6.4 oz)             CONSTITUTIONAL: Well developed, and nourished, appropriately responsive and aware without distress. ***  EYES: Sclera non-icteric.   EARS, NOSE, MOUTH AND THROAT: Mask worn.  *** The oropharynx is clear. Oral mucosa is pink and moist.  Dentition: ***   Hearing is intact to voice.  NECK: Trachea is midline, and there is no jugular venous distension.  LYMPH NODES:  Lymph nodes in the neck are not appreciated. RESPIRATORY:  Lungs are clear, and breath sounds are equal bilaterally. *** Normal respiratory effort without pathologic use of accessory muscles. CARDIOVASCULAR: Heart is regular in rate and rhythm.   *** Well perfused.  GI: The abdomen is *** soft, nontender, and nondistended. There were no palpable masses. *** I did not appreciate hepatosplenomegaly. There were normal bowel sounds.  *** GU: *** MUSCULOSKELETAL:  Symmetrical muscle tone appreciated in all four extremities.    SKIN: Skin turgor is normal. No pathologic skin lesions appreciated.  NEUROLOGIC:  Motor and sensation appear grossly normal.  Cranial nerves are grossly without defect. PSYCH:  Alert and oriented to person, place and time. Affect is appropriate for situation.  Data Reviewed I have personally reviewed what is currently available of the patient's imaging, recent labs and medical records.   Labs:     Latest Ref Rng & Units 01/03/2023   11:56 AM 10/10/2022    5:09 AM 10/09/2022    7:26 AM  CBC  WBC 4.0 - 10.5 K/uL 5.3  4.5  4.9   Hemoglobin 12.0 - 15.0 g/dL 95.1  9.7  88.4   Hematocrit 36.0 - 46.0 % 32.8  29.8  31.6   Platelets 150 - 400 K/uL 141  104  94       Latest Ref Rng & Units 01/03/2023   11:56 AM 10/10/2022    5:09 AM 10/09/2022    7:26 AM  CMP  Glucose 70 - 99 mg/dL 75  166  95   BUN 8 - 23 mg/dL 49  48  37   Creatinine 0.44 - 1.00 mg/dL 2.95  2.84  1.32   Sodium 135 - 145 mmol/L 138  137  138   Potassium 3.5 - 5.1 mmol/L 3.9  4.0  2.9   Chloride 98 - 111 mmol/L 95  96  96   CO2 22 - 32 mmol/L 29  27  27    Calcium 8.9 - 10.3 mg/dL 8.6  8.2  8.4   Total Protein 6.5 - 8.1 g/dL 7.8  6.7  7.5   Total Bilirubin 0.3 - 1.2 mg/dL 0.9  1.0  1.3   Alkaline Phos 38 - 126 U/L 213  164  182   AST 15 - 41 U/L 25  35  29   ALT 0 - 44 U/L 16  18  18     REPORT OF SURGICAL PATHOLOGY Addendum: Breast Biomarker Results FINAL DIAGNOSIS Diagnosis Breast, right, needle core biopsy, 10 ocl, 7cmfn, mass - INVASIVE MAMMARY CARCINOMA WITH TUBULAR FEATURES, SEE NOTE - DUCTAL CARCINOMA IN SITU (DCIS): PRESENT, FOCAL, GRADE 2 - TUBULE FORMATION: SCORE 1 - NUCLEAR PLEOMORPHISM: SCORE 2 - MITOTIC COUNT: SCORE 1 - TOTAL  SCORE: 4 - OVERALL GRADE: 1 - LYMPHOVASCULAR INVASION: NOT IDENTIFIED - CANCER LENGTH: 0.6 CM - CALCIFICATIONS: PRESENT, FOCAL Diagnosis Note Immunohistochemical staining for myoepithelial markers (calponin, smooth muscle myosin, and p63) are performed on block 1A. The myoepithelial markers are focally lost which is consistent with the above diagnosis. Dr. Oneita Kras reviewed the case and concurs with the interpretation. A breast prognostic profile (ER, PR, and HER2) is pending and will be reported in an addendum. Randa Lynn, RN was notified via secure chat on 12/29/2022. Leonarda Salon M.D. Pathologist, Electronic Signature (Case signed 12/29/2022) Specimen Gross and Clinical Information Specimen Comment TIF: 1:35 pm, CIT < 30 sec; mass Specimen(s) Obtained: Breast, right, needle core biopsy, 10 ocl, 7cmfn, mass 1 of 3  Specimen Clinical Information C/F IDC Gross Received in formalin labeled with the patient's name and right breast 10 o'clock, 7 cm from nipple, and consists of four cores of fibroadipose tissue, ranging from 0.5 x 0.2 x 0.1 cm to 1.6 x 0.2 x 0.1 cm. The specimen is entirely submitted in one cassette. TIF 1:35 p.m. on 12/27/22. CIT less than 30 secs. (KL:gt, 12/28/22) Stain(s) used in Diagnosis: The following stain(s) were used in diagnosing the case: ER-ACIS, PR-ACIS, Her2 by IHC. The control(s) stained appropriately. ADDITIONAL INFORMATION: 1A) Breast, right, needle core biopsy, 10 ocl, 7 cmfn, mass PROGNOSTIC INDICATORS Results: IMMUNOHISTOCHEMICAL AND MORPHOMETRIC ANALYSIS PERFORMED MANUALLY The tumor cells are NEGATIVE for Her2 (1+). Estrogen Receptor: 100%, POSITIVE, STRONG STAINING INTENSITY Progesterone Receptor: 100%, POSITIVE, STRONG STAINING INTENSITY REFERENCE RANGE ESTROGEN RECEPTOR NEGATIVE 0% POSITIVE =>1% REFERENCE RANGE PROGESTERONE RECEPTOR NEGATIVE 0% POSITIVE =>1% All controls stained appropriately Jerene Bears MD Pathologist,  Electronic Signature ( Signed 12/30/2022) {Labs :[id number]}  Imaging: Radiological images reviewed:  ADDENDUM REPORT: 01/03/2023 13:15   ADDENDUM: Addendum to state that targeted right axillary ultrasound was performed on 12/13/2022, demonstrating morphologically benign lymph nodes. No right axillary lymphadenopathy.     Electronically Signed   By: Jacob Moores M.D.   On: 01/03/2023 13:15    Addended by Jacob Moores, MD on 01/03/2023  3:15 PM    Study Result  Narrative & Impression  CLINICAL DATA:  Screening recall for possible right breast distortion. Patient is status post subareolar ductal excision in December 2014.   EXAM: DIGITAL DIAGNOSTIC UNILATERAL RIGHT MAMMOGRAM WITH TOMOSYNTHESIS AND CAD; ULTRASOUND RIGHT BREAST LIMITED  TECHNIQUE: Right digital diagnostic mammography and breast tomosynthesis was performed. The images were evaluated with computer-aided detection. ; Targeted ultrasound examination of the right breast was performed   COMPARISON:  Previous exam(s).   ACR Breast Density Category c: The breasts are heterogeneously dense, which may obscure small masses.   FINDINGS: Spot tomosynthesis views demonstrate persistent architectural distortion in the upper-outer right breast middle depth (spot CC image 15/34, spot MLO 2 of 2 image 23/34). This corresponds with the screening mammogram finding and is not definitely stable compared to prior exams. Additionally, on physical exam, no visible scar is noted in this area. No additional suspicious mass, calcification, or other findings are identified in the right breast.   Targeted right breast ultrasound was performed. At 10 o'clock 7 cm from the nipple, there is an irregular hypoechoic mass with internal vascularity. It measures 10 x 9 x 9 mm. This corresponds with the architectural distortion seen on mammogram.   IMPRESSION: Right breast 10 mm irregular mass in the 10 o'clock position is suspicious  for malignancy. Recommend further assessment with ultrasound-guided biopsy.   RECOMMENDATION: Right breast ultrasound-guided biopsy (1 site).   I have discussed the findings and recommendations with the patient. If applicable, a reminder letter will be sent to the patient regarding the next appointment.   BI-RADS CATEGORY  4: Suspicious.   Electronically Signed: By: Jacob Moores M.D. On: 12/13/2022 14:30    Within last 24 hrs: No results found.  Assessment    *** Patient Active Problem List   Diagnosis Date Noted  . Invasive carcinoma of breast (HCC) 01/03/2023  . Family history of cancer 01/03/2023  . Complication of vascular graft 10/09/2022  . Hypercalcemia 06/03/2022  . Chronic kidney disease (CKD), stage III (moderate) (HCC) 06/03/2022  . Hypertension 06/03/2022  . Allergy, unspecified, initial encounter 01/30/2022  . Anaphylactic shock, unspecified, initial encounter 01/30/2022  . Mixed hyperlipidemia 01/06/2021  . Contact with and (suspected) exposure to covid-19 11/06/2020  . Encounter for screening for COVID-19 11/06/2020  . CAD in native artery 09/10/2020  . Complication of vascular access for dialysis 08/19/2020  . 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  . Gout 02/27/2020  . Hyperlipidemia 02/27/2020  . Hypothyroidism 02/27/2020  . Obesity 02/27/2020  . TB lung, 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  . Other disorders of phosphorus metabolism 11/07/2018  . COVID-19 10/15/2018  . Hyperkalemia 08/08/2018  . Anemia in chronic kidney disease 04/10/2017  . Coagulation defect, unspecified (HCC) 04/10/2017  . Diarrhea, unspecified 04/10/2017  . Encounter for immunization 04/10/2017  . End stage renal disease (HCC) 04/10/2017  . Fever, unspecified 04/10/2017  . Headache, unspecified 04/10/2017  . Iron deficiency anemia, unspecified 04/10/2017  . Moderate protein-calorie malnutrition (HCC) 04/10/2017  . Pain, unspecified 04/10/2017  . Pruritus, unspecified  04/10/2017  . Secondary hyperparathyroidism of renal origin (HCC) 04/10/2017  . Shortness of breath 04/10/2017  . Type 2 diabetes mellitus with diabetic chronic kidney disease (HCC) 04/10/2017  . Hypertensive chronic kidney disease with stage 1 through stage 4 chronic kidney disease, or unspecified chronic kidney disease 04/10/2017  . Chronic renal insufficiency 12/18/2016  . Essential hypertension 12/18/2016  . Intraductal papilloma of breast 05/16/2013  . Proteinuria 10/07/2012    Plan    ***  * Cannot find OR case * *** Face-to-face time spent with the patient and accompanying care providers(if present) was *** minutes, with more than 50% of the time spent counseling, educating, and coordinating care of the patient.    These notes generated with  voice recognition software. I apologize for typographical errors.  Campbell Lerner M.D., FACS 01/10/2023, 4:03 PM

## 2023-01-10 ENCOUNTER — Ambulatory Visit: Admission: RE | Admit: 2023-01-10 | Payer: 59 | Source: Home / Self Care | Admitting: Gastroenterology

## 2023-01-10 ENCOUNTER — Ambulatory Visit (INDEPENDENT_AMBULATORY_CARE_PROVIDER_SITE_OTHER): Payer: 59 | Admitting: Surgery

## 2023-01-10 ENCOUNTER — Encounter: Admission: RE | Payer: Self-pay | Source: Home / Self Care

## 2023-01-10 ENCOUNTER — Encounter: Payer: Self-pay | Admitting: Surgery

## 2023-01-10 VITALS — BP 154/80 | HR 74 | Temp 98.0°F | Ht 61.0 in | Wt 138.4 lb

## 2023-01-10 DIAGNOSIS — C50919 Malignant neoplasm of unspecified site of unspecified female breast: Secondary | ICD-10-CM

## 2023-01-10 DIAGNOSIS — C50411 Malignant neoplasm of upper-outer quadrant of right female breast: Secondary | ICD-10-CM

## 2023-01-10 SURGERY — COLONOSCOPY WITH PROPOFOL
Anesthesia: General

## 2023-01-10 MED ORDER — LIDOCAINE-PRILOCAINE 2.5-2.5 % EX CREA: TOPICAL_CREAM | CUTANEOUS | 0 refills | Status: DC

## 2023-01-10 NOTE — Patient Instructions (Signed)
We have spoken today about removing a lump in your breast. This will be done by Dr. Claudine Mouton at [facility].  You will most likely be able to leave the hospital several hours after your surgery. Rarely, a patient needs to stay over night but this is a possibility.  Plan to tentatively be off work for 1-2 weeks following the surgery and may return with approximately 2 more weeks of a lifting restriction, no greater than 15 lbs.  Please see your Blue surgery sheet for more information. Our surgery scheduler will call you to look at surgery dates and to go over information.   If you have FMLA or Disability paperwork that needs to be filled out, please have your company fax your paperwork to [phone] or you may drop this by either office. This paperwork will be filled out within 3 days after your surgery has been completed.    Lumpectomy A lumpectomy is a form of "breast conserving" or "breast preservation" surgery. It may also be referred to as a partial mastectomy. During a lumpectomy, the portion of the breast that contains the cancerous tumor or breast mass (the lump) is removed. Some normal tissue around the lump may also be removed to make sure all of the tumor has been removed.  LET [facility] CARE PROVIDER KNOW ABOUT: Any allergies you have. All medicines you are taking, including vitamins, herbs, eye drops, creams, and over-the-counter medicines. Previous problems you or members of your family have had with the use of anesthetics. Any blood disorders you have. Previous surgeries you have had. Medical conditions you have. RISKS AND COMPLICATIONS Generally, this is a safe procedure. However, problems can occur and include: Bleeding. Infection. Pain. Temporary swelling. Change in the shape of the breast, particularly if a large portion is removed. BEFORE THE PROCEDURE Ask your health care provider about changing or stopping your regular medicines. This is especially important if you  are taking diabetes medicines or blood thinners. Do not eat or drink anything after midnight on the night before the procedure or as directed by your health care provider. Ask your health care provider if you can take a sip of water with any approved medicines. On the day of surgery, your health care provider will use a mammogram or ultrasound to locate and mark the tumor in your breast. These markings on your breast will show where the cut (incision) will be made. PROCEDURE  An IV tube will be put into one of your veins. You may be given medicine to help you relax before the surgery (sedative). You will be given one of the following: A medicine that numbs the area (local anesthetic). A medicine that makes you fall asleep (general anesthetic). Your health care provider will use a kind of electric scalpel that uses heat to minimize bleeding (electrocautery knife). A curved incision (like a smile or frown) that follows the natural curve of your breast is made, to allow for minimal scarring and better healing. The tumor will be removed with some of the surrounding tissue. This will be sent to the lab for analysis. Your health care provider may also remove your lymph nodes at this time if needed. Sometimes, but not always, a rubber tube called a drain will be surgically inserted into your breast area or armpit to collect excess fluid that may accumulate in the space where the tumor was. This drain is connected to a plastic bulb on the outside of your body. This drain creates suction to help remove  the fluid. The incisions will be closed with stitches (sutures). A bandage may be placed over the incisions. AFTER THE PROCEDURE You will be taken to the recovery area. You will be given medicine for pain. A small rubber drain may be placed in the breast for 2-3 days to prevent a collection of blood (hematoma) from developing in the breast. You will be given instructions on caring for the drain before you go  home. A pressure bandage (dressing) will be applied for 1-2 days to prevent bleeding. Ask your health care provider how to care for your bandage at home.   This information is not intended to replace advice given to you by your health care provider. Make sure you discuss any questions you have with your health care provider.   Document Released: 06/27/2006 Document Revised: 06/06/2014 Document Reviewed: 10/19/2012 Elsevier Interactive Patient Education [employer]

## 2023-01-11 ENCOUNTER — Encounter: Payer: Self-pay | Admitting: *Deleted

## 2023-01-11 ENCOUNTER — Ambulatory Visit: Payer: Self-pay | Admitting: Surgery

## 2023-01-11 ENCOUNTER — Other Ambulatory Visit: Payer: Self-pay | Admitting: Surgery

## 2023-01-11 ENCOUNTER — Telehealth: Payer: Self-pay | Admitting: Surgery

## 2023-01-11 DIAGNOSIS — C50919 Malignant neoplasm of unspecified site of unspecified female breast: Secondary | ICD-10-CM

## 2023-01-11 DIAGNOSIS — R928 Other abnormal and inconclusive findings on diagnostic imaging of breast: Secondary | ICD-10-CM

## 2023-01-11 NOTE — Progress Notes (Signed)
Lumpectomy is scheduled for 8/21.  She will see Dr. Cathie Hoops and Dr. Rushie Chestnut on 9/10.  Appt. Details left on VM and copy of AVS mailed.

## 2023-01-11 NOTE — Telephone Encounter (Signed)
Patient has been advised of Pre-Admission date/time, and Surgery date at [facility].  Surgery Date: 01/18/23 patient to arrive at 10:30 am as will be having SLN bx injection prior to her surgery.      Preadmission Testing Date: 01/12/23 (phone 1p-4p)

## 2023-01-12 ENCOUNTER — Encounter
Admission: RE | Admit: 2023-01-12 | Discharge: 2023-01-12 | Disposition: A | Discharge status: Home / Self Care | Payer: 59 | Source: Ambulatory Visit | Attending: Surgery | Admitting: Surgery

## 2023-01-12 ENCOUNTER — Inpatient Hospital Stay: Payer: 59

## 2023-01-12 ENCOUNTER — Other Ambulatory Visit: Payer: Self-pay

## 2023-01-12 ENCOUNTER — Encounter: Payer: Self-pay | Admitting: Licensed Clinical Social Worker

## 2023-01-12 ENCOUNTER — Inpatient Hospital Stay (HOSPITAL_BASED_OUTPATIENT_CLINIC_OR_DEPARTMENT_OTHER): Payer: 59 | Admitting: Licensed Clinical Social Worker

## 2023-01-12 ENCOUNTER — Ambulatory Visit: Payer: Self-pay | Admitting: Surgery

## 2023-01-12 DIAGNOSIS — C50919 Malignant neoplasm of unspecified site of unspecified female breast: Secondary | ICD-10-CM

## 2023-01-12 DIAGNOSIS — Z01812 Encounter for preprocedural laboratory examination: Secondary | ICD-10-CM

## 2023-01-12 DIAGNOSIS — I10 Essential (primary) hypertension: Secondary | ICD-10-CM

## 2023-01-12 DIAGNOSIS — Z8049 Family history of malignant neoplasm of other genital organs: Secondary | ICD-10-CM | POA: Diagnosis not present

## 2023-01-12 DIAGNOSIS — Z800 Family history of malignant neoplasm of digestive organs: Secondary | ICD-10-CM

## 2023-01-12 DIAGNOSIS — Z7183 Encounter for nonprocreative genetic counseling: Secondary | ICD-10-CM

## 2023-01-12 HISTORY — DX: Atherosclerotic heart disease of native coronary artery without angina pectoris: I25.10

## 2023-01-12 HISTORY — DX: Unspecified osteoarthritis, unspecified site: M19.90

## 2023-01-12 HISTORY — DX: Dyspnea, unspecified: R06.00

## 2023-01-12 HISTORY — DX: Cardiac murmur, unspecified: R01.1

## 2023-01-12 NOTE — Progress Notes (Signed)
REFERRING PROVIDER: Rickard Patience, MD [address] [place],  [place] [address]  PRIMARY PROVIDER:  Resa Miner, MD  PRIMARY REASON FOR VISIT:  1. Invasive carcinoma of breast (HCC)   2. Family history of uterine cancer   3. Family history of pancreatic cancer      HISTORY OF PRESENT ILLNESS:   [name], a 62 y.o. female, was seen for a [place] cancer genetics consultation at the request of Dr. Cathie Hoops due to a personal and family history of cancer.  Felicia Butler presents to clinic today to discuss the possibility of a hereditary predisposition to cancer, genetic testing, and to further clarify her future cancer risks, as well as potential cancer risks for family members.   CANCER HISTORY:  In 2024, at the age of 28, Felicia Butler was diagnosed with invasive mammary carcinoma of the right breast, ER/PR+ HER2-. The treatment plan includes lumpectomy, scheduled for 8/21, Oncotype testing to determine if chemotherapy is needed, adjuvant radiation and adjuvant endocrine therapy.   Oncology History  Invasive carcinoma of breast (HCC)  12/07/2022 Mammogram   Bilateral screening mammogram showed possible right breast distortion   12/13/2022 Mammogram   Unilateral right diagnostic mammogram showed right breast 10 mm irregular mass in the 10 o'clock position suspicious for malignancy.  Right axillary ultrasound was performed and demonstrated morphologically benign lymph nodes.  No right axillary lymphadenopathy.   12/27/2022 Initial Diagnosis   Invasive carcinoma of breast (HCC)  Breast, right, needle core biopsy, 10 ocl, 7cmfn, mass - INVASIVE MAMMARY CARCINOMA WITH TUBULAR FEATURES, SEE NOTE - DUCTAL CARCINOMA IN SITU (DCIS): PRESENT, FOCAL, GRADE 2 - TUBULE FORMATION: SCORE 1 - NUCLEAR PLEOMORPHISM: SCORE 2 - MITOTIC COUNT: SCORE 1 - TOTAL SCORE: 4 - OVERALL GRADE: 1 - LYMPHOVASCULAR INVASION: NOT IDENTIFIED - CANCER LENGTH: 0.6 CM - CALCIFICATIONS: PRESENT, FOCAL  ER 100%+, PR  100%, HER2 negative.  (1+)   01/03/2023 Cancer Staging   Staging form: Breast, AJCC 8th Edition - Clinical stage from 01/03/2023: cT1b, cN0, cM0, ER+, PR+, HER2- - Signed by Rickard Patience, MD on 01/03/2023 Stage prefix: Initial diagnosis     RISK FACTORS: Menarche was at age 50-13.  First live birth at age 63.  Ovaries intact: yes.  Hysterectomy: yes.  Colonoscopy: yes;  few polyps . Mammogram within the last year: yes. Past Medical History:  Diagnosis Date   Anemia    Chronic kidney disease    Dialysis patient [facility])    M-W-F   GERD (gastroesophageal reflux disease)    Gout    Hernia    Hyperlipidemia    Hypertension    Hypothyroidism    Moderate pulmonary hypertension (HCC)    TB lung, latent    Thyroid disease     Past Surgical History:  Procedure Laterality Date   A/V FISTULAGRAM N/A 10/10/2022   Procedure: A/V Fistulagram;  Surgeon: Annice Needy, MD;  Location: [facility] INVASIVE CV LAB;  Service: Cardiovascular;  Laterality: N/A;   A/V FISTULAGRAM N/A 02/01/2022   Procedure: A/V Fistulagram;  Surgeon: Renford Dills, MD;  Location: [facility] INVASIVE CV LAB;  Service: Cardiovascular;  Laterality: N/A;   A/V FISTULAGRAM Left 11/08/2022   Procedure: A/V Fistulagram;  Surgeon: Renford Dills, MD;  Location: [facility] INVASIVE CV LAB;  Service: Cardiovascular;  Laterality: Left;   ABDOMINAL HYSTERECTOMY     AV FISTULA PLACEMENT Left 01/18/2017   Procedure: ARTERIOVENOUS (AV) FISTULA CREATION ( BRACHIAL CEPHALIC );  Surgeon: Renford Dills, MD;  Location:  [facility] ORS;  Service: Vascular;  Laterality: Left;   BREAST BIOPSY Right 04/30/2013   intraductal papilloma   BREAST BIOPSY Right 04/30/2013   cyst aspiration   BREAST BIOPSY Right 05/27/2013   subareolar duct excision   BREAST BIOPSY Left 05/27/2013   subareolar duct excision   BREAST BIOPSY Left 08/11/2016   benign   BREAST BIOPSY Right 12/27/2022   [place] bx/ ribbon clip/ path pending   BREAST BIOPSY Right 12/27/2022   [place] RT  BREAST BX W LOC DEV 1ST LESION IMG BX SPEC US GUIDE 12/27/2022 [facility]   COLONOSCOPY  06/28/2018   DIALYSIS/PERMA CATHETER INSERTION N/A 02/01/2022   Procedure: DIALYSIS/PERMA CATHETER INSERTION;  Surgeon: Renford Dills, MD;  Location: [facility] INVASIVE CV LAB;  Service: Cardiovascular;  Laterality: N/A;   DIALYSIS/PERMA CATHETER REMOVAL N/A 07/28/2022   Procedure: DIALYSIS/PERMA CATHETER REMOVAL;  Surgeon: Annice Needy, MD;  Location: [facility] INVASIVE CV LAB;  Service: Cardiovascular;  Laterality: N/A;   HERNIA REPAIR  1960's   PARTIAL HYSTERECTOMY     1980's   PERIPHERAL VASCULAR THROMBECTOMY Left 04/18/2017   Procedure: PERIPHERAL VASCULAR THROMBECTOMY;  Surgeon: Renford Dills, MD;  Location: [facility] INVASIVE CV LAB;  Service: Cardiovascular;  Laterality: Left;   REVISION OF ARTERIOVENOUS GORETEX GRAFT Left 10/09/2022   Procedure: REVISION OF ARTERIOVENOUS FISTULA;  Surgeon: Nada Libman, MD;  Location: [facility] ORS;  Service: Vascular;  Laterality: Left;   REVISON OF ARTERIOVENOUS FISTULA Left 05/06/2022   Procedure: REVISON OF ARTERIOVENOUS FISTULA (BRACHIAL CEPHALIC);  Surgeon: Renford Dills, MD;  Location: [facility] ORS;  Service: Vascular;  Laterality: Left;    FAMILY HISTORY:  We obtained a detailed, 4-generation family history.  Significant diagnoses are listed below: Family History  Problem Relation Age of Onset   Uterine cancer Mother 92   Hypertension Father    Diabetes Father    Stroke Father    Kidney failure Father    Cancer Maternal Grandmother        unk type   Pancreatic cancer Cousin    Pancreatic cancer Cousin    Breast cancer Neg Hx    Felicia Butler has 1 son. She has 6 brothers and 5 sisters, no cancers.  Felicia Butler's mother had uterine cancer at 64 and passed at 55. Two maternal cousins (who were siblings) both died of pancreatic cancer. Maternal grandmother had cancer, unknown type.  Felicia Butler's father died at 4. She has limited information about  his side of the family, possible cancers in aunts/uncles.  Felicia Butler is unaware of previous family history of genetic testing for hereditary cancer risks. There is no reported Ashkenazi Jewish ancestry. There is no known consanguinity.    GENETIC COUNSELING ASSESSMENT: Ms. Sabas is a 62 y.o. female with a personal and family history of cancer which is somewhat suggestive of a hereditary cancer syndrome and predisposition to cancer. We, therefore, discussed and recommended the following at today's visit.   DISCUSSION: We discussed that approximately 10% of breast cancer is hereditary. Most cases of hereditary breast cancer are associated with BRCA1/BRCA2 genes, which can also increase the risk for pancreatic cancer. There are other genes associated with hereditary cancer as well. Cancers and risks are gene specific. We discussed that testing is beneficial for several reasons including knowing about cancer risks, identifying potential screening and risk-reduction options that may be appropriate, and to understand if other family members could be at risk for cancer and allow them to undergo genetic testing.   We reviewed  the characteristics, features and inheritance patterns of hereditary cancer syndromes. We also discussed genetic testing, including the appropriate family members to test, the process of testing, insurance coverage and turn-around-time for results. We discussed the implications of a negative, positive and/or variant of uncertain significant result. We recommended Ms. Corigliano pursue genetic testing for the [facility] Common Hereditary Cancers+RNA gene panel.   Based on Ms. Heumann's personal and family history of cancer, she meets medical criteria for genetic testing. Despite that she meets criteria, she may still have an out of pocket cost.   PLAN: After considering the risks, benefits, and limitations, Ms. Traino provided informed consent to pursue genetic testing and the blood sample  was sent to [facility] for analysis of the Common Hereditary Cancers+RNA panel. Results should be available within approximately 2-3 weeks' time, at which point they will be disclosed by telephone to Ms. Hauck, as will any additional recommendations warranted by these results. Ms. Reh will receive a summary of her genetic counseling visit and a copy of her results once available. This information will also be available in Epic.   Ms. Lamay questions were answered to her satisfaction today. Our contact information was provided should additional questions or concerns arise. Thank you for the referral and allowing [place] to share in the care of your patient.   Lacy Duverney, MS, [facility] Genetic Counselor [email or web address] Phone: [phone]  The patient was seen for a total of 20 minutes in face-to-face genetic counseling.  Dr. Blake Divine was available for discussion regarding this case.   _______________________________________________________________________ For Office Staff:  Number of people involved in session: 1 Was an Intern/ student involved with case: no

## 2023-01-12 NOTE — Patient Instructions (Addendum)
Your procedure is scheduled on:01/18/23 - Wednesday Report to the Registration Desk on the 1st floor of the [facility] Mall. To find out your arrival time, please call [phone] between 1PM - 3PM on: 01/17/23 - Tuesday If your arrival time is 6:00 am, do not arrive before that time as the Medical Mall entrance doors do not open until 6:00 am.  REMEMBER: Instructions that are not followed completely may result in serious medical risk, up to and including death; or upon the discretion of your surgeon and anesthesiologist your surgery may need to be rescheduled.  Do not eat food or drink any liquids after midnight the night before surgery.  No gum chewing or hard candies.  One week prior to surgery: Stop Anti-inflammatories (NSAIDS) such as Advil, Aleve, Ibuprofen, Motrin, Naproxen, Naprosyn and Aspirin based products such as Excedrin, Goody's Powder, BC Powder.  Stop ANY OVER THE COUNTER supplements until after surgery.  You may however, continue to take Tylenol if needed for pain up until the day of surgery.   TAKE ONLY THESE MEDICATIONS THE MORNING OF SURGERY WITH A SIP OF WATER:  famotidine (PEPCID) - (take one the night before and one on the morning of surgery - helps to prevent nausea after surgery.) amLODipine (NORVASC)  AURYXIA  cinacalcet (SENSIPAR) hydrALAZINE (APRESOLINE)  levothyroxine (SYNTHROID, LEVOTHROID)    HOLD losartan (COZAAR) on the day of your surgery.    No Alcohol for 24 hours before or after surgery.  No Smoking including e-cigarettes for 24 hours before surgery.  No chewable tobacco products for at least 6 hours before surgery.  No nicotine patches on the day of surgery.  Do not use any "recreational" drugs for at least a week (preferably 2 weeks) before your surgery.  Please be advised that the combination of cocaine and anesthesia may have negative outcomes, up to and including death. If you test positive for cocaine, your surgery will be  cancelled.  On the morning of surgery brush your teeth with toothpaste and water, you may rinse your mouth with mouthwash if you wish. Do not swallow any toothpaste or mouthwash.  Use CHG Soap or wipes as directed on instruction sheet.  Do not wear jewelry, make-up, hairpins, clips or nail polish.  Do not wear lotions, powders, or perfumes.   Do not shave body hair from the neck down 48 hours before surgery.  Contact lenses, hearing aids and dentures may not be worn into surgery.  Do not bring valuables to the hospital. [facility] is not responsible for any missing/lost belongings or valuables.   Notify your doctor if there is any change in your medical condition (cold, fever, infection).  Wear comfortable clothing (specific to your surgery type) to the hospital.  After surgery, you can help prevent lung complications by doing breathing exercises.  Take deep breaths and cough every 1-2 hours. Your doctor may order a device called an Incentive Spirometer to help you take deep breaths. When coughing or sneezing, hold a pillow firmly against your incision with both hands. This is called "splinting." Doing this helps protect your incision. It also decreases belly discomfort.  If you are being admitted to the hospital overnight, leave your suitcase in the car. After surgery it may be brought to your room.  In case of increased patient census, it may be necessary for you, the patient, to continue your postoperative care in the Same Day Surgery department.  If you are being discharged the day of surgery, you will not be  allowed to drive home. You will need a responsible individual to drive you home and stay with you for 24 hours after surgery.   If you are taking public transportation, you will need to have a responsible individual with you.  Please call the Pre-admissions Testing Dept. at [phone] if you have any questions about these instructions.  Surgery Visitation  Policy:  Patients having surgery or a procedure may have two visitors.  Children under the age of 7 must have an adult with them who is not the patient.  Inpatient Visitation:    Visiting hours are 7 a.m. to 8 p.m. Up to four visitors are allowed at one time in a patient room. The visitors may rotate out with other people during the day.  One visitor age 25 or older may stay with the patient overnight and must be in the room by 8 p.m.    Preparing for Surgery with CHLORHEXIDINE GLUCONATE (CHG) Soap  Chlorhexidine Gluconate (CHG) Soap  o An antiseptic cleaner that kills germs and bonds with the skin to continue killing germs even after washing  o Used for showering the night before surgery and morning of surgery  Before surgery, you can play an important role by reducing the number of germs on your skin.  CHG (Chlorhexidine gluconate) soap is an antiseptic cleanser which kills germs and bonds with the skin to continue killing germs even after washing.  Please do not use if you have an allergy to CHG or antibacterial soaps. If your skin becomes reddened/irritated stop using the CHG.  1. Shower the NIGHT BEFORE SURGERY and the MORNING OF SURGERY with CHG soap.  2. If you choose to wash your hair, wash your hair first as usual with your normal shampoo.  3. After shampooing, rinse your hair and body thoroughly to remove the shampoo.  4. Use CHG as you would any other liquid soap. You can apply CHG directly to the skin and wash gently with a scrungie or a clean washcloth.  5. Apply the CHG soap to your body only from the neck down. Do not use on open wounds or open sores. Avoid contact with your eyes, ears, mouth, and genitals (private parts). Wash face and genitals (private parts) with your normal soap.  6. Wash thoroughly, paying special attention to the area where your surgery will be performed.  7. Thoroughly rinse your body with warm water.  8. Do not shower/wash with your  normal soap after using and rinsing off the CHG soap.  9. Pat yourself dry with a clean towel.  10. Wear clean pajamas to bed the night before surgery.  12. Place clean sheets on your bed the night of your first shower and do not sleep with pets.  13. Shower again with the CHG soap on the day of surgery prior to arriving at the hospital.  14. Do not apply any deodorants/lotions/powders.  15. Please wear clean clothes to the hospital.

## 2023-01-13 ENCOUNTER — Encounter
Admission: RE | Admit: 2023-01-13 | Discharge: 2023-01-13 | Disposition: A | Discharge status: Home / Self Care | Payer: 59 | Source: Ambulatory Visit | Attending: Surgery | Admitting: Surgery

## 2023-01-13 ENCOUNTER — Encounter: Payer: Self-pay | Admitting: Surgery

## 2023-01-13 ENCOUNTER — Telehealth: Payer: Self-pay | Admitting: Urgent Care

## 2023-01-13 DIAGNOSIS — Z01812 Encounter for preprocedural laboratory examination: Secondary | ICD-10-CM | POA: Insufficient documentation

## 2023-01-13 DIAGNOSIS — Z992 Dependence on renal dialysis: Secondary | ICD-10-CM | POA: Insufficient documentation

## 2023-01-13 DIAGNOSIS — I2510 Atherosclerotic heart disease of native coronary artery without angina pectoris: Secondary | ICD-10-CM

## 2023-01-13 DIAGNOSIS — R9431 Abnormal electrocardiogram [ECG] [EKG]: Secondary | ICD-10-CM

## 2023-01-13 DIAGNOSIS — C50911 Malignant neoplasm of unspecified site of right female breast: Secondary | ICD-10-CM | POA: Insufficient documentation

## 2023-01-13 DIAGNOSIS — E785 Hyperlipidemia, unspecified: Secondary | ICD-10-CM | POA: Diagnosis not present

## 2023-01-13 DIAGNOSIS — E875 Hyperkalemia: Secondary | ICD-10-CM

## 2023-01-13 DIAGNOSIS — I10 Essential (primary) hypertension: Secondary | ICD-10-CM | POA: Diagnosis not present

## 2023-01-13 DIAGNOSIS — Z01810 Encounter for preprocedural cardiovascular examination: Secondary | ICD-10-CM

## 2023-01-13 DIAGNOSIS — E039 Hypothyroidism, unspecified: Secondary | ICD-10-CM | POA: Insufficient documentation

## 2023-01-13 DIAGNOSIS — Z01818 Encounter for other preprocedural examination: Secondary | ICD-10-CM | POA: Diagnosis present

## 2023-01-13 HISTORY — DX: End stage renal disease: N18.6

## 2023-01-13 NOTE — Progress Notes (Addendum)
Perioperative Services Pre-Admission/Anesthesia Testing    Date: 01/13/23  Name: Felicia Butler MRN:   [id number]  Re: Abnormal ECG and plans for surgery  Planned Surgical Procedure(s):    Case: [id number] Date/Time: 01/18/23 1158   Procedure: BREAST LUMPECTOMY WITH SENTINEL LYMPH NODE BX (Right)   Anesthesia type: General   Pre-op diagnosis: invasive carcinoma right breast   Location: [facility] OR ROOM 06 / [facility]   Surgeons: Campbell Lerner, MD      Clinical Notes:  Patient is scheduled for the above procedure on 01/18/2023 with Dr. Campbell Lerner, MD. In preparation for her procedure, patient presented to the PAT clinic on 01/13/2023 for preoperative lab and ECG testing. In review of her ECG, patient with noted critical QTc prolongation to 577 ms. Tracing also reveals lateral T wave abnormalities. When compared to previous ECG, patient did have QTc prolongation on her 05/04/2023 tracing. QTc interval at that time was 511 ms. She had surgery here at [facility] later that month with no documented complications.   I have reviewed her medication list. I do not see any medications that would be implicated in her QTc prolongation (no torsadogenics). Patient does have ESRD and is on hemodialysis on M-W-F. Labs reviewed:  Na 138 mmol/l K+ 3.9 mmol/L Ca2+ 8.6 mg/dL BUN 49 mg/dL Creatinine 8.54 mg/dL  Patient does have hypothyroidism, which is a known cause of abnormal QTc prolongation. With that said, in review of her recent labs, patient's TSH was WNL at 3.970 uIU/mL on 10/05/2022.   Spoke with anesthesia. Patient will need clearance from cardiology before proceeding. Anesthesia stands to further prolong the QTc placing patient at increased risk of TdP. Given her newly diagnosed malignancy, I am soliciting input from medicine, as patient has multiple medical comorbidities, and from PCCM  given her pulmonary HTN diagnosis. Efforts  being made to ensure that patient is safely able to undergo planned procedure without delay.   Impression and Plan:  Felicia Butler with worsening QTc prolongation noted on her ECG. Will recheck TSH and add a magnesium level to preoperative labs. I have discussed case details and findings with attending anesthesiologist on call Joelene Millin, MD). MD requesting clearance from cardiologist prior to proceeding. MD was informed that clearances have already been requested from cardiology, PCCM, and medicine. We are awaiting responses from the aforementioned specialty providers at this time.   Message sent to patients care team to make them aware of today's findings, additional lab orders, and plans for follow up with cardiology. No changes are being made to the OR schedule at this time. Again, with her malignancy, which carries an overall unknown prognosis, the goal is to not have to delay the patient's case. Did advise that case being able to proceed would be based on cardiology evaluation, any testing that may be deemed necessary, and ultimately, clearance from the cardiology service line. Message was sent to surgeon Claudine Mouton, MD), medical oncologist Cathie Hoops, MD), and breast nurse navigators Lalla Brothers, RN and Christell Constant, RN). Will plan on keep the team updated on new information as it is made available to me.   ADDENDUM 01/13/2023 at 1610: Unable to add on repeat TSH and MG today. I did not realize that labs were not drawn today. Above referenced labs were drawn on 01/03/2023. Attempted to contact patient to have her return today for repeat labs, however I was not able to reach her. Additionally, received communication back from cardiology office regarding clearance. Patient will need to  be seen in office prior to clearance being provided. I did leave patient a message making her aware of this. She was asked to call her cardiologist's office ASAP to discuss appointment details. While in the process of documenting, I was  contacted by the cardiology. They are able to work patient in on Monday morning (01/16/2023) at 0800. When they contacted the patient, she informed them that she cannot make this appointment die to her dialysis (M-W-F). Cardiology office advised that they are scheduling out about 2-3 weeks at this point. They suggested patient be referred to a [facility] practice for cardiology evaluation and clearance.   Quentin Mulling, MSN, APRN, FNP-C, CEN [facility]  Peri-operative Services Nurse Practitioner Phone: [phone] 01/13/23 3:06 PM  NOTE: This note has been prepared using Dragon dictation software. Despite my best ability to proofread, there is always the potential that unintentional transcriptional errors may still occur from this process.

## 2023-01-13 NOTE — Progress Notes (Addendum)
  Perioperative Services Pre-Admission/Anesthesia Testing    Date: 01/13/23  Name: Felicia Butler MRN:   [id number]  Re: Cardiology clearance required prior to surgery (BREAST LUMPECTOMY WITH SENTINEL LYMPH NODE BX)  Patient with abnormal ECG. Patient unable to get in with her cardiologist prior to surgery. Spoke with cardiology office. They advised that it could be quite sometime (~3 weeks) before patient could be seen. In the essences of time, in the setting of patient with a newly diagnosed breast malignancy, efforts are being made to not postpone patient's surgery. With that said, anesthesia stands to lead to potential exacerbation of patient's prolonged QTc interval. [facility] cardiology made the recommendation that patient be seen locally for evaluation of EKG concern and for surgical clearance.   Reached out to [facility] to inquire about the availability of an appointment for this patient. Discussed scheduling conflicts due to dialysis. They were able to graciously offer an appointment with Dr. Azucena Cecil, MD on Tuesday (01/17/2023) morning at 0800. I contacted the patient to discuss. Reviewed EKG concerns in the setting of her upcoming surgical/anesthetic courses. Patient verbalized understanding and accepted the offered appointment here. Referral entered and office made aware.   Will follow up after cardiology evaluation regarding recommendations and ultimate disposition regarding surgery. Will forward note to care team in efforts to update everyone regarding plans for this patient.   Quentin Mulling, MSN, APRN, FNP-C, CEN [facility]  Peri-operative Services Nurse Practitioner Phone: [phone] 01/13/23 4:40 PM  NOTE: This note has been prepared using Dragon dictation software. Despite my best ability to proofread, there is always the potential that unintentional transcriptional errors may still occur from this process.

## 2023-01-16 ENCOUNTER — Encounter: Payer: Self-pay | Admitting: Surgery

## 2023-01-16 ENCOUNTER — Other Ambulatory Visit: Payer: 59

## 2023-01-16 NOTE — Progress Notes (Signed)
Perioperative / Anesthesia Services  Pre-Admission Testing Clinical Review / Preoperative Anesthesia Consult  Date: 01/17/23  Patient Demographics:  Name: Felicia Butler DOB:   Sep 02, 1960 MRN:   [id number]  Planned Surgical Procedure(s):    Case: [id number] Date/Time: 01/18/23 1020   Procedure: BREAST LUMPECTOMY WITH SENTINEL LYMPH NODE BX (Right)   Anesthesia type: General   Pre-op diagnosis: invasive carcinoma right breast   Location: [facility] OR ROOM 06 / [facility]   Surgeons: Campbell Lerner, MD     NOTE: Available PAT nursing documentation and vital signs have been reviewed. Clinical nursing staff has updated patient's PMH/PSHx, current medication list, and drug allergies/intolerances to ensure comprehensive history available to assist in medical decision making as it pertains to the aforementioned surgical procedure and anticipated anesthetic course. Extensive review of available clinical information personally performed. [place] PMH and PSHx updated with any diagnoses/procedures that  may have been inadvertently omitted during her intake with the pre-admission testing department's nursing staff.  Clinical Discussion:  Felicia Butler is a 62 y.o. female who is submitted for pre-surgical anesthesia review and clearance prior to her undergoing the above procedure. Patient is a Former Smoker (quit 05/1983). 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  Patient is followed by cardiology at [facility]. She was last seen in the cardiology clinic on 09/02/2021; notes reviewed. Patient was being seen as part of a pre-renal transplant workup. At the time of her clinic visit, patient doing well overall from a  cardiovascular perspective. Patient denied any chest pain, shortness of breath, PND, orthopnea, palpitations, significant peripheral edema, weakness, fatigue, vertiginous symptoms, or presyncope/syncope. Patient with a past medical history significant for cardiovascular diagnoses. Documented physical exam was grossly benign, providing no evidence of acute exacerbation and/or decompensation of the patient's known cardiovascular conditions.  Blood pressure was uncontrolled at 174/83 mmHg on prescribed CCB (amlodipine), vasodilator (hydralazine) and ARB (losartan) therapies.  Patient is on atorvastatin for her HLD diagnosis and ASCVD prevention. Patient is not diabetic. She does not have an OSAH diagnosis. Patient had no documented limitations with regards to her functional capacity. No changes were made to her medication regimen during her visit with cardiology.  In efforts to better risk stratify patient for potential renal transplant, non-invasive cardiovascular workup was ordered (TTE and MPI study). Patient scheduled to follow-up with outpatient cardiology in 2 months or sooner if needed.  Following her visit with cardiology, patient underwent the recommended noninvasive cardiovascular studies.  Myocardial perfusion imaging study performed on 09/21/2021 revealed a normal left ventricular systolic function with an EF of 55%.  There was no evidence of stress-induced myocardial ischemia or arrhythmia; no scintigraphic evidence of scar.  No significant coronary artery calcifications were noted on the attenuation correction imaging CT.  Study determined to be normal and low risk.  TTE performed on 12/09/2021 revealed a normal left ventricular systolic function with an EF of >55%.  There were no regional wall motion abnormalities. Left ventricular diastolic Doppler parameters consistent with pseudonormalization (G2DD).  GLS -16.8%.  Right ventricular size and function normal.  Left atrium mildly dilated.  No  significant valvular regurgitation noted.  All transvalvular gradients were noted to be normal providing no evidence suggestive of valvular stenosis.  TR maximal velocity 3.5 m/s.  Estimated PASP 52 mmHg  consistent with moderate pulmonary hypertension.  Aorta normal in size with no evidence of aneurysmal dilatation.  Patient underwent core needle biopsy of a RIGHT breast mass on 12/27/2022.  Pathology returned positive for invasive mammary carcinoma (cT1b, cN0c, M0).  Immunohistochemical testing (IHC) revealed that tumor was ER/PR (+) and HER2/neu (-).  Patient was scheduled for consultation with general surgery to discuss surgical resection for final staging and treatment planning.  Patient has been scheduled for an BREAST LUMPECTOMY WITH SENTINEL LYMPH NODE BX (Right) on 01/18/2023 with Dr. Campbell Lerner, MD. in review of her preoperative ECG, patient with a critically prolonged QTc interval of 577 ms. When comparing to previous ECG, patient with prolonged QTc back in 04/2022, at which time it was 511 ms.    Reached out to patient's primary cardiology team to discuss.  Cardiology team offered to see patient in consult, however due to her dialysis schedule, patient was unable to be seen in clinic prior to upcoming surgical date.  Primary cardiology team recommended that patient be seen locally.  Reached out to [facility] and was graciously provided an appointment for preoperative evaluation of ECG abnormality.  Patient was seen in consult by local cardiology provider Azucena Cecil, MD) on 01/17/2023; notes reviewed.  At the time of her visit with cardiology, patient was doing well overall from a cardiovascular perspective. She has no complaints. Patient being seen for preoperative clearance prior to upcoming lumpectomy. EKG abnormality (prolonged QTc) had improved. Cardiology noted that this was likely due to ESRD diagnosis. Per cardiology, "patient may proceed with planned surgery at an overall  ACCEPTABLE risk. Avoid QT prolonging medications. Ensure potassium and magnesium levels are within normal limits". No changes made to medication regimen. Patient to follow up with outpatient cardiology in 4 months or sooner if needed.   In review of her medication reconciliation, the patient is not noted to be taking any type of anticoagulation or antiplatelet therapies that would need to be held during her perioperative course.  Patient denies previous perioperative complications with anesthesia in the past. In review of the available records, it is noted that patient underwent a general anesthetic course here at [facility] in 09/2022 without documented complications.      01/17/2023    8:18 AM 01/10/2023    2:42 PM 01/03/2023   11:24 AM  Vitals with BMI  Height 5\' 1"  5\' 1"  5\' 1"   Weight 138 lbs 138 lbs 6 oz 143 lbs  BMI 26.09 26.16 27.03  Systolic 164 154 102  Diastolic 92 80 80  Pulse 83 74 73    Providers/Specialists:   NOTE: Primary physician provider listed below. Patient may have been seen by APP or partner within same practice.   PROVIDER ROLE / SPECIALTY LAST Barton Fanny, MD General Surgery (Surgeon) 01/10/2023  Resa Miner, MD Primary Care Provider ???  Damien Fusi, MD Cardiology 09/02/2021  Debbe Odea, MD Cardiology (local consulting) 01/17/2023  Haze Justin, MD Pulmonary Medicine 10/25/2022  Rickard Patience, MD Medical Oncology 01/03/2023  Molli Barrows, MD Nephrology 01/11/2023   Allergies:  Patient has no known allergies.  Current Home Medications:   No current facility-administered medications for this encounter.    acetaminophen (TYLENOL) 500 MG tablet   amLODipine (NORVASC) 10 MG tablet   atorvastatin (LIPITOR) 80 MG tablet   AURYXIA 1 GM 210 MG(Fe) tablet   cinacalcet (SENSIPAR) 30 MG tablet   famotidine (PEPCID) 20 MG tablet   hydrALAZINE (  APRESOLINE) 50 MG tablet   levothyroxine  (SYNTHROID, LEVOTHROID) 75 MCG tablet   lidocaine-prilocaine (EMLA) cream   losartan (COZAAR) 100 MG tablet   Methoxy PEG-Epoetin Beta (MIRCERA IJ)   History:   Past Medical History:  Diagnosis Date   (HFpEF) heart failure with preserved ejection fraction (HCC) 12/09/2021   a.) TTE 12/09/2021: EF >55%, no RWMAs, GLS -16.8%, mild LAE, mod pHTN (RVSP 52), G2DD   Anemia associated with chronic renal failure    Aortic atherosclerosis (HCC)    Arthritis    Atherosclerosis of iliac artery    Coronary artery disease    a.) MV 11/28/2019: no sig CAD, no ischemia; b.) MV 09/21/2021: no sig CAD, no ischemia   DDD (degenerative disc disease), lumbar    Dyspnea    ESRD (end stage renal disease) on dialysis [facility])    a.) started HD on M-W-F in 2018   FSGS (focal segmental glomerulosclerosis) 1987   a.) diagnosed by Bx in 1987   GERD (gastroesophageal reflux disease)    Gout    Heart murmur    Hyperlipidemia    Hypertension    Hypothyroidism    Invasive carcinoma of RIGHT breast (HCC) 12/27/2022   a.) CNB of RIGHT breast mass on 12/27/2022 --> invasive mammary carcinoma (cT1b, cN0, cM0); ER/PR (+), HER2/neu (-).   Prolonged QT interval 05/03/2022   a.) ECG 05/03/2022: QTc = 511 ms; b.) ECG 01/13/2023: QTc = 577 ms   Pulmonary HTN (HCC) 12/09/2021   a.) TTE 12/09/2021: RVSP 52 mmHg   TB lung, latent    a.) (+) PPD 09/2011. CXR (-). Declined INH due to potential for hepatotoxicity.   Umbilical hernia    Past Surgical History:  Procedure Laterality Date   A/V FISTULAGRAM N/A 10/10/2022   Procedure: A/V Fistulagram;  Surgeon: Annice Needy, MD;  Location: [facility] INVASIVE CV LAB;  Service: Cardiovascular;  Laterality: N/A;   A/V FISTULAGRAM N/A 02/01/2022   Procedure: A/V Fistulagram;  Surgeon: Renford Dills, MD;  Location: [facility] INVASIVE CV LAB;  Service: Cardiovascular;  Laterality: N/A;   A/V FISTULAGRAM Left 11/08/2022   Procedure: A/V Fistulagram;  Surgeon: Renford Dills, MD;   Location: [facility] INVASIVE CV LAB;  Service: Cardiovascular;  Laterality: Left;   ABDOMINAL HYSTERECTOMY     AV FISTULA PLACEMENT Left 01/18/2017   Procedure: ARTERIOVENOUS (AV) FISTULA CREATION ( BRACHIAL CEPHALIC );  Surgeon: Renford Dills, MD;  Location: [facility] ORS;  Service: Vascular;  Laterality: Left;   BREAST BIOPSY Right 04/30/2013   intraductal papilloma   BREAST BIOPSY Right 04/30/2013   cyst aspiration   BREAST BIOPSY Right 05/27/2013   subareolar duct excision   BREAST BIOPSY Left 05/27/2013   subareolar duct excision   BREAST BIOPSY Left 08/11/2016   benign   BREAST BIOPSY Right 12/27/2022   [place] bx/ ribbon clip/ path pending   BREAST BIOPSY Right 12/27/2022   [place] RT BREAST BX W LOC DEV 1ST LESION IMG BX SPEC US GUIDE 12/27/2022 [facility]   COLONOSCOPY  06/28/2018   DIALYSIS/PERMA CATHETER INSERTION N/A 02/01/2022   Procedure: DIALYSIS/PERMA CATHETER INSERTION;  Surgeon: Renford Dills, MD;  Location: [facility] INVASIVE CV LAB;  Service: Cardiovascular;  Laterality: N/A;   DIALYSIS/PERMA CATHETER REMOVAL N/A 07/28/2022   Procedure: DIALYSIS/PERMA CATHETER REMOVAL;  Surgeon: Annice Needy, MD;  Location: [facility] INVASIVE CV LAB;  Service: Cardiovascular;  Laterality: N/A;   HERNIA REPAIR  1960's   PARTIAL HYSTERECTOMY     1980's   PERIPHERAL  VASCULAR THROMBECTOMY Left 04/18/2017   Procedure: PERIPHERAL VASCULAR THROMBECTOMY;  Surgeon: Renford Dills, MD;  Location: [facility] INVASIVE CV LAB;  Service: Cardiovascular;  Laterality: Left;   REVISION OF ARTERIOVENOUS GORETEX GRAFT Left 10/09/2022   Procedure: REVISION OF ARTERIOVENOUS FISTULA;  Surgeon: Nada Libman, MD;  Location: [facility] ORS;  Service: Vascular;  Laterality: Left;   REVISON OF ARTERIOVENOUS FISTULA Left 05/06/2022   Procedure: REVISON OF ARTERIOVENOUS FISTULA (BRACHIAL CEPHALIC);  Surgeon: Renford Dills, MD;  Location: [facility] ORS;  Service: Vascular;  Laterality: Left;   Family History  Problem Relation  Age of Onset   Uterine cancer Mother 69   Hypertension Father    Diabetes Father    Stroke Father    Kidney failure Father    Cancer Maternal Grandmother        unk type   Pancreatic cancer Cousin    Pancreatic cancer Cousin    Breast cancer Neg Hx    Social History   Tobacco Use   Smoking status: Former    Current packs/day: 0.00    Types: Cigarettes    Start date: 05/30/1978    Quit date: 05/31/1983    Years since quitting: 39.6    Passive exposure: Past   Smokeless tobacco: Never  Vaping Use   Vaping status: Never Used  Substance Use Topics   Alcohol use: No   Drug use: No    Pertinent Clinical Results:  LABS:   Lab Results  Component Value Date   WBC 5.3 01/03/2023   HGB 10.8 (L) 01/03/2023   HCT 32.8 (L) 01/03/2023   MCV 85.0 01/03/2023   PLT 141 (L) 01/03/2023   Lab Results  Component Value Date   NA 138 01/03/2023   K 3.9 01/03/2023   CO2 29 01/03/2023   GLUCOSE 75 01/03/2023   BUN 49 (H) 01/03/2023   CREATININE 6.80 (H) 01/03/2023   CALCIUM 8.6 (L) 01/03/2023   GFRNONAA 6 (L) 01/03/2023     ECG: Date: 01/13/2023 Time ECG obtained: 1338 PM Rate: 77 bpm Rhythm: normal sinus Axis (leads I and aVF): Left axis deviation Intervals: PR 186 ms. QRS 96 ms. QTc 577 ms. ST segment and T wave changes: Anterolateral TWIs. Non-specific T-wave abnormalities.  Comparison: T wave abnormalities appear to be new when compared to 05/03/2022 tracing. Critical prolongation of QTc now present (previously 511 ms).    IMAGING / PROCEDURES: PULMONARY FUNCTION TESTING performed on 10/06/2022 Component Ref Range & Units 10/06/2022  FVC PRE 1.74 - 3.11 L 1.67 Low   FEV1 PRE 1.38 - 2.47 L 1.33 Low   FEV1/FVC PRE 68.06 - 90.66 % 79.60  FEF25-75% PRE 0.90 - 3.10 L/s 1.30  ISOFEF25-75 PRE L/s 1.30  FIVC PRE 1.74 - 3.11 L 1.60 Low   FEF50% PRE 1.49 - 5.11 L/s 1.79  FIF50% PRED L/s 2.23  PEF PRE 2.97 - 6.45 L/s 6.00  PIF PRE 1.82 - 6.62 L/s 2.26  FET PRE sec  7.97  FET100% Change sec 7.87  WUJ/WJX91 pre % 80.23  EOTT PRE sec 7.97  Vol extrap pre L 0.08  Grade FVC A19 PRE 1.00  Grade FEV1 A19 PRE 1.00  DLCO unadj. SB PRE 13.26 - 22.44 ml/(min*mmHg) 8.25 Low   DLCO PB adj. SB PRE 13.26 - 22.44 ml/(min*mmHg) 8.17 Low   DLCO PRE 13.26 - 22.44 ml/(min*mmHg) 8.17 Low   Grade PRE 11.00  DLCO/VA POST 3.38 - 5.53 ml/(min*mmHg*L) 3.25 Low   VA PRE 3.21 - 4.83 L  2.51 Low   IVC PRE 2.22 - 3.43 L 1.66 Low   VIN%VCmax PRE % 99.86  TLC SB PRE 3.48 - 5.28 L 2.65 Low   Pbar PRE mmHg 747.37  RV SB PRE 0.94 - 2.32 L 0.99  FEV1Q PRE 3.31  FVC LLN 1.74  FVC Predicted 2.41  FVC PreZ-Score -1.83  FVC % Predicted PRE % 69 %  FEV1 LLN 1.38  FEV1 Predicted 1.94  FEV1 PreZ-Score -1.81  FEV1 % Predicted PRE % 68 %  FEV1/FVC LLN 68  FEV1/FVC Predicted 81  FEV1/FVC PreZ-Score -0.14  FEV1/FVC % Predicted PRE % 99 %  FEF25-75% LLN 0.90  FEF25-75% Predicted 1.83  FEF25-75% PreZ-Score -0.87  FEF25-75% % Predicted PRE % 71 %  FVCIN LLN 1.74  FIVC Predicted 2.41  FVCIN PreZ-Score -1.99  FIVC % Predicted PRE % 66 %  FEF50% LLN 1.49  FEF50% Predicted 3.30  FEF50% PreZ-Score -1.37  FEF50% % Predicted PRE % 54 %  PEF LLN 2.97  PEF Predicted 4.71  PEF PreZ-Score 1.22  PEF % Predicted PRE % 127 %  PIF LLN 1.82  PIF Predicted 4.22  PIF PreZ-Score -0.22  PIF % Predicted PRE % 54 %  FVC PreZ-Score -1.83  FEV1 PreZ-Score -1.81  FEV1/FVC PreZ-Score 0  DLCOunadjustedSB LLN 13.26  DLCOunadjustedSB Ref 17.39  DLCOunadjustedSB Z-Score -4.28-4.28  DLCOunadjustedSB Pre%Ref % 47 %  DLCOPBadjustedSB LLN 13.26  DLCOPBadjustedSB Ref 17.39  DLCOPBadjustedSB Z-Score -4.32-4.32  DLCOPBadjustedSB Pre%Ref % 47 %  DLCOSingleBreath LLN 13.26  DLCOSINGLEBREATH REF 17.39  DLCOSingleBreath Z-Score -4.32-4.32  DLCOSINGLEBREATH PRE%REF % 47 %  DLCO/VA LLN 3.38  DLCO/VA Predicted 4.39  DLCO/VA Z-Score -1.87-1.87  DLCO/VA % Predicted PRE % 74 %   VASingleBreath LLN 3.21  VASINGLEBREATH REF 3.98  VASingleBreath Z-Score -3.31-3.31  VASINGLEBREATH PRE%REF % 63 %  IVCSingleBreath LLN 2.22  IVCSINGLEBREATH REF 2.83  IVCSingleBreath Z-Score -3.12-3.12  IVCSINGLEBREATH PRE%REF % 59 %  TLCSingleBreath LLN 3.48  TLCSingleBreath Ref 4.33  TLCSingleBreath Z-Score -3.48-3.48  TLCSingleBreath Pre%Ref % 61 %  RVSingleBreath LLN 0.94  RVSingleBreath Ref 1.54  RVSingleBreath Z-Score -1.49-1.49  RVSingleBreath Pre%Ref % 64 %  Specimen Collected: --   Performed by: [facility] RAD Last Resulted: 10/06/22 14:02  Received From: [facility]  Result Received: 11/15/22 09:56   TRANSTHORACIC ECHOCARDIOGRAM performed on 0713/2023 The left ventricle is normal in size with normal wall thickness.  The left ventricular systolic function is normal, LVEF is visually estimated at > 55%.  There is grade II diastolic dysfunction (elevated filling pressure).  LV global longitudinal strain: -16.8 %. The right ventricle is normal in size, with normal systolic function.  There is moderate pulmonary hypertension; TR maximum velocity: 3.5 m/s Estimated PASP: 52 mmHg.   MYOCARDIAL PERFUSION IMAGING STUDY (LEXISCAN) performed on 09/21/2021 Normal myocardial perfusion study, similar to prior.  No evidence of any significant ischemia or scar  Left ventricular systolic function is normal. Post stress the ejection fraction is  55%. The left ventricle is normal in size.  No significant coronary calcifications were noted on the attenuation CT  Incidental CT findings: Atrophic kidneys are noted.   Impression and Plan:  Felicia Butler has been referred for pre-anesthesia review and clearance prior to her undergoing the planned anesthetic and procedural courses. Available labs, pertinent testing, and imaging results were personally reviewed by me in preparation for upcoming operative/procedural course. [facility] medical record has been updated following extensive  record review and patient interview with PAT staff.   This patient  has been appropriately cleared by cardiology with an overall ACCEPTABLE risk of experiencing significant perioperative cardiovascular complications. Orders added for preoperative K+ and Mg+ levels per recommendations from cardiology. Of note, previously attempted to add these labs on to assess levels, however new blood sample was not obtained on the day that the patient came in for her EKG. Due to her hemodialysis schedule, she was unable to return for lab testing. We will obtain in preoperative setting and anesthesia team to review reusltsand follow up on treatment plan with surgery should intervention be required.    Based on clinical review performed today (01/17/23), barring any significant acute changes in the patient's overall condition, it is anticipated that she will be able to proceed with the planned surgical intervention. Any acute changes in clinical condition may necessitate her procedure being postponed and/or cancelled. Patient will meet with anesthesia team (MD and/or CRNA) on the day of her procedure for preoperative evaluation/assessment. Questions regarding anesthetic course will be fielded at that time.   Pre-surgical instructions were reviewed with the patient during her PAT appointment, and questions were fielded to satisfaction by PAT clinical staff. She has been instructed on which medications that she will need to hold prior to surgery, as well as the ones that have been deemed safe/appropriate to take on the day of her procedure. As part of the general education provided by PAT, patient made aware both verbally and in writing, that she would need to abstain from the use of any illegal substances during her perioperative course.  She was advised that failure to follow the provided instructions could necessitate case cancellation or result in serious perioperative complications up to and including death. Patient  encouraged to contact PAT and/or her surgeon's office to discuss any questions or concerns that may arise prior to surgery; verbalized understanding.   Quentin Mulling, MSN, APRN, FNP-C, CEN [facility]  Peri-operative Services Nurse Practitioner Phone: [phone] Fax: [phone] 01/17/23 2:50 PM  NOTE: This note has been prepared using Dragon dictation software. Despite my best ability to proofread, there is always the potential that unintentional transcriptional errors may still occur from this process.

## 2023-01-17 ENCOUNTER — Encounter: Payer: Self-pay | Admitting: Cardiology

## 2023-01-17 ENCOUNTER — Ambulatory Visit: Payer: 59 | Attending: Cardiology | Admitting: Cardiology

## 2023-01-17 VITALS — BP 164/92 | HR 83 | Ht 61.0 in | Wt 138.0 lb

## 2023-01-17 DIAGNOSIS — I5189 Other ill-defined heart diseases: Secondary | ICD-10-CM

## 2023-01-17 DIAGNOSIS — R9431 Abnormal electrocardiogram [ECG] [EKG]: Secondary | ICD-10-CM | POA: Diagnosis not present

## 2023-01-17 DIAGNOSIS — I10 Essential (primary) hypertension: Secondary | ICD-10-CM | POA: Diagnosis not present

## 2023-01-17 DIAGNOSIS — Z01810 Encounter for preprocedural cardiovascular examination: Secondary | ICD-10-CM | POA: Diagnosis not present

## 2023-01-17 NOTE — Patient Instructions (Signed)
Medication Instructions:   Your physician recommends that you continue on your current medications as directed. Please refer to the Current Medication list given to you today.  *If you need a refill on your cardiac medications before your next appointment, please call your pharmacy*   Lab Work:  None Ordered  If you have labs (blood work) drawn today and your tests are completely normal, you will receive your results only by: MyChart Message (if you have MyChart) OR A paper copy in the mail If you have any lab test that is abnormal or we need to change your treatment, we will call you to review the results.   Testing/Procedures:  None Ordered   Follow-Up: At [facility], you and your health needs are our priority.  As part of our continuing mission to provide you with exceptional heart care, we have created designated Provider Care Teams.  These Care Teams include your primary Cardiologist (physician) and Advanced Practice Providers (APPs -  Physician Assistants and Nurse Practitioners) who all work together to provide you with the care you need, when you need it.  We recommend signing up for the patient portal called "MyChart".  Sign up information is provided on this After Visit Summary.  MyChart is used to connect with patients for Virtual Visits (Telemedicine).  Patients are able to view lab/test results, encounter notes, upcoming appointments, etc.  Non-urgent messages can be sent to your provider as well.   To learn more about what you can do with MyChart, go to [email or web address].    Your next appointment:   4 month(s)  Provider:   You may see Debbe Odea, MD or one of the following Advanced Practice Providers on your designated Care Team:   Nicolasa Ducking, NP Eula Listen, PA-C Cadence Fransico Michael, PA-C Charlsie Quest, NP

## 2023-01-17 NOTE — Progress Notes (Signed)
Cardiology Office Note:    Date:  01/17/2023   ID:  Felicia Butler, DOB 23-Nov-1960, MRN [id number]  PCP:  Resa Miner, MD   [facility] Providers Cardiologist:  Debbe Odea, MD     Referring MD: Resa Miner, MD   Chief Complaint  Patient presents with   New Patient (Initial Visit)    Referred for cardiac clearance prior to breast lumpectomy scheduled tomorrow (01/18/23).  Previously followed by [facility] cardiology but the could not accommodate an appointment to provide medical clearance prior to surgery. Patient is transferring cardiac care to this office.  Currently doing dialysis on Mon, Wed, and Fri.     History of Present Illness:    Felicia Butler is a 62 y.o. female with a hx of hypertension, hypothyroidism, hyperlipidemia, ESRD on HD MWF presenting for preop evaluation.  Recently diagnosed with right breast cancer, lumpectomy with sentinel lymph node biopsy being planned for tomorrow.  Follows up with [facility] cardiology from a cardiac perspective.  Could not accommodate patient for preop eval prior to lumpectomy.  Denies chest pain or shortness of breath.  Has not taken BP meds today, blood pressure usually runs in the 140s, usually lower after dialysis.  Was being considered for renal transplant at [facility].  Pretransplant workup at [facility] with [facility] 08/2021 showed no evidence of significant ischemia or scar. Echo 12/09/2021 at [facility] showed normal EF>55%, G2DD, moderate pulmonary hypertension  Past Medical History:  Diagnosis Date   (HFpEF) heart failure with preserved ejection fraction (HCC) 12/09/2021   a.) TTE 12/09/2021: EF >55%, no RWMAs, GLS -16.8%, mild LAE, mod pHTN (RVSP 52), G2DD   Anemia associated with chronic renal failure    Aortic atherosclerosis (HCC)    Arthritis    Atherosclerosis of iliac artery    Coronary artery disease    a.) MV 11/28/2019: no sig CAD, no ischemia; b.) MV 09/21/2021: no sig CAD, no ischemia   DDD  (degenerative disc disease), lumbar    Dyspnea    ESRD (end stage renal disease) on dialysis [facility])    a.) started HD on M-W-F in 2018   FSGS (focal segmental glomerulosclerosis) 1987   a.) diagnosed by Bx in 1987   GERD (gastroesophageal reflux disease)    Gout    Heart murmur    Hyperlipidemia    Hypertension    Hypothyroidism    Invasive carcinoma of RIGHT breast (HCC) 12/27/2022   a.) CNB of RIGHT breast mass on 12/27/2022 --> invasive mammary carcinoma (cT1b, cN0, cM0); ER/PR (+), HER2/neu (-).   Prolonged QT interval 05/03/2022   a.) ECG 05/03/2022: QTc = 511 ms; b.) ECG 01/13/2023: QTc = 577 ms   Pulmonary HTN (HCC) 12/09/2021   a.) TTE 12/09/2021: RVSP 52 mmHg   TB lung, latent    a.) (+) PPD 09/2011. CXR (-). Declined INH due to potential for hepatotoxicity.   Umbilical hernia     Past Surgical History:  Procedure Laterality Date   A/V FISTULAGRAM N/A 10/10/2022   Procedure: A/V Fistulagram;  Surgeon: Annice Needy, MD;  Location: [facility] INVASIVE CV LAB;  Service: Cardiovascular;  Laterality: N/A;   A/V FISTULAGRAM N/A 02/01/2022   Procedure: A/V Fistulagram;  Surgeon: Renford Dills, MD;  Location: [facility] INVASIVE CV LAB;  Service: Cardiovascular;  Laterality: N/A;   A/V FISTULAGRAM Left 11/08/2022   Procedure: A/V Fistulagram;  Surgeon: Renford Dills, MD;  Location: [facility] INVASIVE CV LAB;  Service: Cardiovascular;  Laterality: Left;   ABDOMINAL  HYSTERECTOMY     AV FISTULA PLACEMENT Left 01/18/2017   Procedure: ARTERIOVENOUS (AV) FISTULA CREATION ( BRACHIAL CEPHALIC );  Surgeon: Renford Dills, MD;  Location: [facility] ORS;  Service: Vascular;  Laterality: Left;   BREAST BIOPSY Right 04/30/2013   intraductal papilloma   BREAST BIOPSY Right 04/30/2013   cyst aspiration   BREAST BIOPSY Right 05/27/2013   subareolar duct excision   BREAST BIOPSY Left 05/27/2013   subareolar duct excision   BREAST BIOPSY Left 08/11/2016   benign   BREAST BIOPSY Right 12/27/2022   [place]  bx/ ribbon clip/ path pending   BREAST BIOPSY Right 12/27/2022   [place] RT BREAST BX W LOC DEV 1ST LESION IMG BX SPEC US GUIDE 12/27/2022 [facility]   COLONOSCOPY  06/28/2018   DIALYSIS/PERMA CATHETER INSERTION N/A 02/01/2022   Procedure: DIALYSIS/PERMA CATHETER INSERTION;  Surgeon: Renford Dills, MD;  Location: [facility] INVASIVE CV LAB;  Service: Cardiovascular;  Laterality: N/A;   DIALYSIS/PERMA CATHETER REMOVAL N/A 07/28/2022   Procedure: DIALYSIS/PERMA CATHETER REMOVAL;  Surgeon: Annice Needy, MD;  Location: [facility] INVASIVE CV LAB;  Service: Cardiovascular;  Laterality: N/A;   HERNIA REPAIR  1960's   PARTIAL HYSTERECTOMY     1980's   PERIPHERAL VASCULAR THROMBECTOMY Left 04/18/2017   Procedure: PERIPHERAL VASCULAR THROMBECTOMY;  Surgeon: Renford Dills, MD;  Location: [facility] INVASIVE CV LAB;  Service: Cardiovascular;  Laterality: Left;   REVISION OF ARTERIOVENOUS GORETEX GRAFT Left 10/09/2022   Procedure: REVISION OF ARTERIOVENOUS FISTULA;  Surgeon: Nada Libman, MD;  Location: [facility] ORS;  Service: Vascular;  Laterality: Left;   REVISON OF ARTERIOVENOUS FISTULA Left 05/06/2022   Procedure: REVISON OF ARTERIOVENOUS FISTULA (BRACHIAL CEPHALIC);  Surgeon: Renford Dills, MD;  Location: [facility] ORS;  Service: Vascular;  Laterality: Left;    Current Medications: Current Meds  Medication Sig   acetaminophen (TYLENOL) 500 MG tablet Take 1,000 mg by mouth every 6 (six) hours as needed.   amLODipine (NORVASC) 10 MG tablet Take 10 mg by mouth daily.   atorvastatin (LIPITOR) 80 MG tablet Take 80 mg by mouth at bedtime.   AURYXIA 1 GM 210 MG(Fe) tablet Take 630 mg by mouth 3 (three) times daily.   cinacalcet (SENSIPAR) 30 MG tablet Take 30 mg by mouth daily.   famotidine (PEPCID) 20 MG tablet Take 1 tablet (20 mg total) by mouth daily.   hydrALAZINE (APRESOLINE) 50 MG tablet Take 50 mg by mouth 3 (three) times daily.   levothyroxine (SYNTHROID, LEVOTHROID) 75 MCG tablet Take 75 mcg by  mouth daily before breakfast.   lidocaine-prilocaine (EMLA) cream Apply to the areola and cover with plastic wrap one hour prior to leaving for surgery.   losartan (COZAAR) 100 MG tablet Take 100 mg by mouth daily.   Methoxy PEG-Epoetin Beta (MIRCERA IJ) Mircera     Allergies:   Patient has no known allergies.   Social History   Socioeconomic History   Marital status: Divorced    Spouse name: Not on file   Number of children: 1   Years of education: Not on file   Highest education level: Not on file  Occupational History   Not on file  Tobacco Use   Smoking status: Former    Current packs/day: 0.00    Types: Cigarettes    Start date: 05/30/1978    Quit date: 05/31/1983    Years since quitting: 39.6    Passive exposure: Past   Smokeless tobacco: Never  Vaping Use   Vaping status:  Never Used  Substance and Sexual Activity   Alcohol use: No   Drug use: No   Sexual activity: Not on file  Other Topics Concern   Not on file  Social History Narrative   Lives with sister and son   Social Determinants of Health   Financial Resource Strain: Not on file  Food Insecurity: No Food Insecurity (01/03/2023)   Hunger Vital Sign    Worried About Running Out of Food in the Last Year: Never true    Ran Out of Food in the Last Year: Never true  Transportation Needs: No Transportation Needs (01/03/2023)   PRAPARE - Administrator, Civil Service (Medical): No    Lack of Transportation (Non-Medical): No  Recent Concern: Transportation Needs - Unmet Transportation Needs (10/09/2022)   PRAPARE - Administrator, Civil Service (Medical): Yes    Lack of Transportation (Non-Medical): Yes  Physical Activity: Not on file  Stress: Not on file  Social Connections: Not on file     Family History: The patient's family history includes Cancer in her maternal grandmother; Diabetes in her father; Hypertension in her father; Kidney failure in her father; Pancreatic cancer in her  cousin and cousin; Stroke in her father; Uterine cancer (age of onset: 4) in her mother. There is no history of Breast cancer.  ROS:   Please see the history of present illness.     All other systems reviewed and are negative.  EKGs/Labs/Other Studies Reviewed:    The following studies were reviewed today:  EKG Interpretation Date/Time:  Tuesday January 17 2023 08:24:04 EDT Ventricular Rate:  83 PR Interval:  194 QRS Duration:  88 QT Interval:  426 QTC Calculation: 500 R Axis:   -34  Text Interpretation: Normal sinus rhythm Left axis deviation Septal infarct , age undetermined QT has shortened Confirmed by Debbe Odea ([id number]) on 01/17/2023 8:31:49 AM    Recent Labs: 01/03/2023: ALT 16; BUN 49; Creatinine, Ser 6.80; Hemoglobin 10.8; Platelets 141; Potassium 3.9; Sodium 138  Recent Lipid Panel No results found for: "CHOL", "TRIG", "HDL", "CHOLHDL", "VLDL", "LDLCALC", "LDLDIRECT"   Risk Assessment/Calculations:          Physical Exam:    VS:  BP (!) 164/92 (BP Location: Right Arm, Patient Position: Sitting, Cuff Size: Normal)   Pulse 83   Ht 5\' 1"  (1.549 m)   Wt 138 lb (62.6 kg)   LMP 11/18/1996 (Approximate)   SpO2 97%   BMI 26.07 kg/m     Wt Readings from Last 3 Encounters:  01/17/23 138 lb (62.6 kg)  01/10/23 138 lb 6.4 oz (62.8 kg)  01/03/23 143 lb (64.9 kg)     GEN:  Well nourished, well developed in no acute distress HEENT: Normal NECK: No JVD; No carotid bruits CARDIAC: RRR, murmur from AV graft. RESPIRATORY: Diminished breath sounds bilaterally , no wheezing ABDOMEN: Soft, non-tender, non-distended MUSCULOSKELETAL:  No edema; No deformity  SKIN: Warm and dry NEUROLOGIC:  Alert and oriented x 3 PSYCHIATRIC:  Normal affect   ASSESSMENT:    1. Preoperative cardiovascular examination   2. Primary hypertension   3. Diastolic dysfunction   4. Prolonged QT interval    PLAN:    In order of problems listed above:  Preop evaluation, lumpectomy  being planned.  Clinically asymptomatic.  Recent echocardiogram showed normal systolic function, Lexiscan Myoview with no ischemia.  Okay to proceed with procedure from a cardiac perspective. Hypertension, BP elevated today, has not taking medications.  Controlled at home after taking medications and after dialysis.  Advised to take meds as prescribed, consider titrating hydralazine if BP stays elevated despite taking meds.  Continue Norvasc 10 mg daily, losartan 100 mg daily, hydralazine 50 mg 3 times daily. Grade 2 diastolic dysfunction, appears euvolemic, volume control with dialysis. QT prolongation noted on EKG, improved from prior EKG.  Etiology likely from renal dysfunction/ESRD.  Avoid QT prolonging medications, make sure of mag, potassium within normal limits.  Follow-up in 4 months.      Medication Adjustments/Labs and Tests Ordered: Current medicines are reviewed at length with the patient today.  Concerns regarding medicines are outlined above.  Orders Placed This Encounter  Procedures   EKG 12-Lead   No orders of the defined types were placed in this encounter.   Patient Instructions  Medication Instructions:   Your physician recommends that you continue on your current medications as directed. Please refer to the Current Medication list given to you today.  *If you need a refill on your cardiac medications before your next appointment, please call your pharmacy*   Lab Work:  None Ordered  If you have labs (blood work) drawn today and your tests are completely normal, you will receive your results only by: MyChart Message (if you have MyChart) OR A paper copy in the mail If you have any lab test that is abnormal or we need to change your treatment, we will call you to review the results.   Testing/Procedures:  None Ordered   Follow-Up: At [facility], you and your health needs are our priority.  As part of our continuing mission to provide you with  exceptional heart care, we have created designated Provider Care Teams.  These Care Teams include your primary Cardiologist (physician) and Advanced Practice Providers (APPs -  Physician Assistants and Nurse Practitioners) who all work together to provide you with the care you need, when you need it.  We recommend signing up for the patient portal called "MyChart".  Sign up information is provided on this After Visit Summary.  MyChart is used to connect with patients for Virtual Visits (Telemedicine).  Patients are able to view lab/test results, encounter notes, upcoming appointments, etc.  Non-urgent messages can be sent to your provider as well.   To learn more about what you can do with MyChart, go to [email or web address].    Your next appointment:   4 month(s)  Provider:   You may see Debbe Odea, MD or one of the following Advanced Practice Providers on your designated Care Team:   Nicolasa Ducking, NP Eula Listen, PA-C Cadence Fransico Michael, PA-C Charlsie Quest, NP   Signed, Debbe Odea, MD  01/17/2023 10:07 AM    [facility]

## 2023-01-18 ENCOUNTER — Other Ambulatory Visit: Payer: Self-pay

## 2023-01-18 ENCOUNTER — Ambulatory Visit: Payer: 59 | Admitting: Urgent Care

## 2023-01-18 ENCOUNTER — Encounter: Payer: Self-pay | Admitting: Surgery

## 2023-01-18 ENCOUNTER — Inpatient Hospital Stay: Payer: 59

## 2023-01-18 ENCOUNTER — Ambulatory Visit
Admission: RE | Admit: 2023-01-18 | Discharge: 2023-01-18 | Disposition: A | Discharge status: Home / Self Care | Payer: 59 | Attending: Surgery | Admitting: Surgery

## 2023-01-18 ENCOUNTER — Ambulatory Visit
Admission: RE | Admit: 2023-01-18 | Discharge: 2023-01-18 | Disposition: A | Discharge status: Home / Self Care | Payer: 59 | Source: Ambulatory Visit | Attending: Surgery | Admitting: Surgery

## 2023-01-18 ENCOUNTER — Ambulatory Visit: Payer: 59

## 2023-01-18 ENCOUNTER — Ambulatory Visit: Admission: RE | Admit: 2023-01-18 | Payer: 59 | Source: Ambulatory Visit

## 2023-01-18 ENCOUNTER — Encounter
Admission: RE | Disposition: A | Discharge status: Home / Self Care | Payer: Self-pay | Source: Home / Self Care | Attending: Surgery

## 2023-01-18 DIAGNOSIS — R9431 Abnormal electrocardiogram [ECG] [EKG]: Secondary | ICD-10-CM

## 2023-01-18 DIAGNOSIS — R928 Other abnormal and inconclusive findings on diagnostic imaging of breast: Secondary | ICD-10-CM

## 2023-01-18 DIAGNOSIS — K219 Gastro-esophageal reflux disease without esophagitis: Secondary | ICD-10-CM | POA: Insufficient documentation

## 2023-01-18 DIAGNOSIS — Z992 Dependence on renal dialysis: Secondary | ICD-10-CM | POA: Diagnosis not present

## 2023-01-18 DIAGNOSIS — D631 Anemia in chronic kidney disease: Secondary | ICD-10-CM | POA: Insufficient documentation

## 2023-01-18 DIAGNOSIS — E039 Hypothyroidism, unspecified: Secondary | ICD-10-CM | POA: Diagnosis not present

## 2023-01-18 DIAGNOSIS — E785 Hyperlipidemia, unspecified: Secondary | ICD-10-CM | POA: Insufficient documentation

## 2023-01-18 DIAGNOSIS — Z87891 Personal history of nicotine dependence: Secondary | ICD-10-CM | POA: Insufficient documentation

## 2023-01-18 DIAGNOSIS — N186 End stage renal disease: Secondary | ICD-10-CM | POA: Diagnosis not present

## 2023-01-18 DIAGNOSIS — I132 Hypertensive heart and chronic kidney disease with heart failure and with stage 5 chronic kidney disease, or end stage renal disease: Secondary | ICD-10-CM | POA: Insufficient documentation

## 2023-01-18 DIAGNOSIS — C50411 Malignant neoplasm of upper-outer quadrant of right female breast: Secondary | ICD-10-CM | POA: Insufficient documentation

## 2023-01-18 DIAGNOSIS — I272 Pulmonary hypertension, unspecified: Secondary | ICD-10-CM | POA: Diagnosis not present

## 2023-01-18 DIAGNOSIS — I5032 Chronic diastolic (congestive) heart failure: Secondary | ICD-10-CM | POA: Diagnosis not present

## 2023-01-18 DIAGNOSIS — Z01812 Encounter for preprocedural laboratory examination: Secondary | ICD-10-CM

## 2023-01-18 DIAGNOSIS — C50919 Malignant neoplasm of unspecified site of unspecified female breast: Secondary | ICD-10-CM

## 2023-01-18 DIAGNOSIS — I2510 Atherosclerotic heart disease of native coronary artery without angina pectoris: Secondary | ICD-10-CM | POA: Insufficient documentation

## 2023-01-18 HISTORY — DX: Other intervertebral disc degeneration, lumbar region without mention of lumbar back pain or lower extremity pain: M51.369

## 2023-01-18 HISTORY — DX: Chronic kidney disease, unspecified: N18.9

## 2023-01-18 HISTORY — DX: Umbilical hernia without obstruction or gangrene: K42.9

## 2023-01-18 HISTORY — DX: Anemia in chronic kidney disease: D63.1

## 2023-01-18 HISTORY — DX: Atherosclerosis of aorta: I70.0

## 2023-01-18 HISTORY — DX: Other intervertebral disc degeneration, lumbar region: M51.36

## 2023-01-18 HISTORY — PX: BREAST LUMPECTOMY WITH SENTINEL LYMPH NODE BIOPSY: SHX5597

## 2023-01-18 HISTORY — DX: Atherosclerosis of other arteries: I70.8

## 2023-01-18 LAB — POCT I-STAT, CHEM 8
BUN: 35 mg/dL — ABNORMAL HIGH (ref 8–23)
Calcium, Ion: 0.99 mmol/L — ABNORMAL LOW (ref 1.15–1.40)
Chloride: 99 mmol/L (ref 98–111)
Creatinine, Ser: 8.4 mg/dL — ABNORMAL HIGH (ref 0.44–1.00)
Glucose, Bld: 83 mg/dL (ref 70–99)
HCT: 36 % (ref 36.0–46.0)
Hemoglobin: 12.2 g/dL (ref 12.0–15.0)
Potassium: 3.2 mmol/L — ABNORMAL LOW (ref 3.5–5.1)
Sodium: 139 mmol/L (ref 135–145)
TCO2: 28 mmol/L (ref 22–32)

## 2023-01-18 LAB — MAGNESIUM: Magnesium: 2.4 mg/dL (ref 1.7–2.4)

## 2023-01-18 SURGERY — BREAST LUMPECTOMY WITH SENTINEL LYMPH NODE BX
Anesthesia: General | Laterality: Right

## 2023-01-18 MED ORDER — DEXMEDETOMIDINE HCL IN NACL 80 MCG/20ML IV SOLN
INTRAVENOUS | Status: AC
  Filled 2023-01-18: qty 20

## 2023-01-18 MED ORDER — ORAL CARE MOUTH RINSE: 15.0000 mL | Freq: Once | OROMUCOSAL | Status: AC

## 2023-01-18 MED ORDER — CEFAZOLIN SODIUM-DEXTROSE 2-4 GM/100ML-% IV SOLN
INTRAVENOUS | Status: AC
  Filled 2023-01-18: qty 100

## 2023-01-18 MED ORDER — CHLORHEXIDINE GLUCONATE 0.12 % MT SOLN
OROMUCOSAL | Status: AC
  Filled 2023-01-18: qty 15

## 2023-01-18 MED ORDER — SODIUM CHLORIDE 0.9 % IV SOLN: INTRAVENOUS | Status: DC

## 2023-01-18 MED ORDER — FENTANYL CITRATE (PF) 100 MCG/2ML IJ SOLN: 25.0000 ug | INTRAMUSCULAR | Status: DC | PRN

## 2023-01-18 MED ORDER — GABAPENTIN 300 MG PO CAPS
ORAL_CAPSULE | ORAL | Status: AC
  Filled 2023-01-18: qty 1

## 2023-01-18 MED ORDER — FENTANYL CITRATE (PF) 100 MCG/2ML IJ SOLN
INTRAMUSCULAR | Status: AC
  Filled 2023-01-18: qty 2

## 2023-01-18 MED ORDER — ONDANSETRON HCL 4 MG/2ML IJ SOLN
INTRAMUSCULAR | Status: AC
  Filled 2023-01-18: qty 2

## 2023-01-18 MED ORDER — OXYCODONE HCL 5 MG/5ML PO SOLN: 5.0000 mg | Freq: Once | ORAL | Status: DC | PRN

## 2023-01-18 MED ORDER — ACETAMINOPHEN 500 MG PO TABS
ORAL_TABLET | ORAL | Status: AC
  Filled 2023-01-18: qty 2

## 2023-01-18 MED ORDER — ISOSULFAN BLUE 1 % ~~LOC~~ SOLN
SUBCUTANEOUS | Status: AC
  Filled 2023-01-18: qty 10

## 2023-01-18 MED ORDER — BUPIVACAINE-EPINEPHRINE 0.25% -1:200000 IJ SOLN
INTRAMUSCULAR | Status: DC | PRN
  Administered 2023-01-18: 20 mL

## 2023-01-18 MED ORDER — CEFAZOLIN SODIUM-DEXTROSE 2-4 GM/100ML-% IV SOLN
2.0000 g | INTRAVENOUS | Status: AC
  Administered 2023-01-18: 2 g via INTRAVENOUS

## 2023-01-18 MED ORDER — BUPIVACAINE LIPOSOME 1.3 % IJ SUSP
INTRAMUSCULAR | Status: AC
  Filled 2023-01-18: qty 20

## 2023-01-18 MED ORDER — BUPIVACAINE LIPOSOME 1.3 % IJ SUSP: 20.0000 mL | Freq: Once | INTRAMUSCULAR | Status: DC

## 2023-01-18 MED ORDER — EPHEDRINE SULFATE (PRESSORS) 50 MG/ML IJ SOLN
INTRAMUSCULAR | Status: DC | PRN
  Administered 2023-01-18 (×3): 5 mg via INTRAVENOUS

## 2023-01-18 MED ORDER — ONDANSETRON HCL 4 MG/2ML IJ SOLN
INTRAMUSCULAR | Status: DC | PRN
  Administered 2023-01-18: 4 mg via INTRAVENOUS

## 2023-01-18 MED ORDER — VASOPRESSIN 20 UNIT/ML IV SOLN
INTRAVENOUS | Status: DC | PRN
Start: 2023-01-18 — End: 2023-01-18
  Administered 2023-01-18 (×3): 2 [IU] via INTRAVENOUS
  Administered 2023-01-18: 1 [IU] via INTRAVENOUS
  Administered 2023-01-18: 2 [IU] via INTRAVENOUS
  Administered 2023-01-18: 1 [IU] via INTRAVENOUS

## 2023-01-18 MED ORDER — FENTANYL CITRATE (PF) 100 MCG/2ML IJ SOLN
INTRAMUSCULAR | Status: DC | PRN
  Administered 2023-01-18 (×3): 25 ug via INTRAVENOUS

## 2023-01-18 MED ORDER — DEXAMETHASONE SODIUM PHOSPHATE 10 MG/ML IJ SOLN
INTRAMUSCULAR | Status: DC | PRN
  Administered 2023-01-18: 10 mg via INTRAVENOUS

## 2023-01-18 MED ORDER — ACETAMINOPHEN 10 MG/ML IV SOLN: 1000.0000 mg | Freq: Once | INTRAVENOUS | Status: DC | PRN

## 2023-01-18 MED ORDER — ISOSULFAN BLUE 1 % ~~LOC~~ SOLN
SUBCUTANEOUS | Status: DC | PRN
  Administered 2023-01-18: 5 mL via SUBCUTANEOUS

## 2023-01-18 MED ORDER — CHLORHEXIDINE GLUCONATE CLOTH 2 % EX PADS: 6.0000 | MEDICATED_PAD | Freq: Once | CUTANEOUS | Status: DC

## 2023-01-18 MED ORDER — TECHNETIUM TC 99M TILMANOCEPT KIT
1.0000 | PACK | Freq: Once | INTRAVENOUS | Status: AC | PRN
  Administered 2023-01-18: 1 via INTRADERMAL

## 2023-01-18 MED ORDER — BUPIVACAINE-EPINEPHRINE (PF) 0.25% -1:200000 IJ SOLN
INTRAMUSCULAR | Status: AC
  Filled 2023-01-18: qty 30

## 2023-01-18 MED ORDER — PHENYLEPHRINE 80 MCG/ML (10ML) SYRINGE FOR IV PUSH (FOR BLOOD PRESSURE SUPPORT)
PREFILLED_SYRINGE | INTRAVENOUS | Status: DC | PRN
  Administered 2023-01-18 (×2): 160 ug via INTRAVENOUS
  Administered 2023-01-18: 80 ug via INTRAVENOUS

## 2023-01-18 MED ORDER — OXYCODONE HCL 5 MG PO TABS: 5.0000 mg | ORAL_TABLET | Freq: Once | ORAL | Status: DC | PRN

## 2023-01-18 MED ORDER — HYDROCODONE-ACETAMINOPHEN 5-325 MG PO TABS: 1.0000 | ORAL_TABLET | Freq: Four times a day (QID) | ORAL | 0 refills | Status: DC | PRN

## 2023-01-18 MED ORDER — DROPERIDOL 2.5 MG/ML IJ SOLN: 0.6250 mg | Freq: Once | INTRAMUSCULAR | Status: DC | PRN

## 2023-01-18 MED ORDER — PROPOFOL 10 MG/ML IV BOLUS
INTRAVENOUS | Status: DC | PRN
  Administered 2023-01-18: 70 mg via INTRAVENOUS
  Administered 2023-01-18: 30 mg via INTRAVENOUS

## 2023-01-18 MED ORDER — LIDOCAINE HCL (CARDIAC) PF 100 MG/5ML IV SOSY
PREFILLED_SYRINGE | INTRAVENOUS | Status: DC | PRN
  Administered 2023-01-18: 60 mg via INTRAVENOUS

## 2023-01-18 MED ORDER — VASOPRESSIN 20 UNIT/ML IV SOLN
INTRAVENOUS | Status: AC
  Filled 2023-01-18: qty 1

## 2023-01-18 MED ORDER — PROPOFOL 10 MG/ML IV BOLUS
INTRAVENOUS | Status: AC
  Filled 2023-01-18: qty 20

## 2023-01-18 MED ORDER — ACETAMINOPHEN 500 MG PO TABS
1000.0000 mg | ORAL_TABLET | ORAL | Status: AC
  Administered 2023-01-18: 1000 mg via ORAL

## 2023-01-18 MED ORDER — LIDOCAINE HCL (PF) 2 % IJ SOLN
INTRAMUSCULAR | Status: AC
  Filled 2023-01-18: qty 5

## 2023-01-18 MED ORDER — PROMETHAZINE HCL 25 MG/ML IJ SOLN: 6.2500 mg | INTRAMUSCULAR | Status: DC | PRN

## 2023-01-18 MED ORDER — CHLORHEXIDINE GLUCONATE 0.12 % MT SOLN
15.0000 mL | Freq: Once | OROMUCOSAL | Status: AC
  Administered 2023-01-18: 15 mL via OROMUCOSAL

## 2023-01-18 MED ORDER — GABAPENTIN 300 MG PO CAPS
300.0000 mg | ORAL_CAPSULE | ORAL | Status: AC
  Administered 2023-01-18: 300 mg via ORAL

## 2023-01-18 SURGICAL SUPPLY — 50 items
ADH SKN CLS APL DERMABOND .7 (GAUZE/BANDAGES/DRESSINGS) ×1
APL PRP STRL LF DISP 70% ISPRP (MISCELLANEOUS) ×1
APPLIER CLIP 9.375 SM OPEN (CLIP)
APR CLP SM 9.3 20 MLT OPN (CLIP)
BLADE SURG 15 STRL LF DISP TIS (BLADE) ×2 IMPLANT
BLADE SURG 15 STRL SS (BLADE) ×2
BULB RESERV EVAC DRAIN JP 100C (MISCELLANEOUS) IMPLANT
CHLORAPREP W/TINT 26 (MISCELLANEOUS) ×1 IMPLANT
CLIP APPLIE 9.375 SM OPEN (CLIP) IMPLANT
CNTNR URN SCR LID CUP LEK RST (MISCELLANEOUS) ×1 IMPLANT
CONT SPEC 4OZ STRL OR WHT (MISCELLANEOUS) ×1
COVER PROBE GAMMA FINDER SLV (MISCELLANEOUS) ×1 IMPLANT
DERMABOND ADVANCED .7 DNX12 (GAUZE/BANDAGES/DRESSINGS) ×1 IMPLANT
DEVICE DUBIN SPECIMEN MAMMOGRA (MISCELLANEOUS) ×1 IMPLANT
DRAIN CHANNEL JP 15F RND 16 (MISCELLANEOUS) IMPLANT
DRAPE LAPAROTOMY TRNSV 106X77 (MISCELLANEOUS) ×1 IMPLANT
ELECT CAUTERY BLADE TIP 2.5 (TIP) ×1
ELECT REM PT RETURN 9FT ADLT (ELECTROSURGICAL) ×1
ELECTRODE CAUTERY BLDE TIP 2.5 (TIP) ×1 IMPLANT
ELECTRODE REM PT RTRN 9FT ADLT (ELECTROSURGICAL) ×1 IMPLANT
GAUZE 4X4 16PLY ~~LOC~~+RFID DBL (SPONGE) ×1 IMPLANT
GLOVE ORTHO TXT STRL SZ7.5 (GLOVE) ×3 IMPLANT
GOWN STRL REUS W/ TWL LRG LVL3 (GOWN DISPOSABLE) ×1 IMPLANT
GOWN STRL REUS W/ TWL XL LVL3 (GOWN DISPOSABLE) ×1 IMPLANT
GOWN STRL REUS W/TWL LRG LVL3 (GOWN DISPOSABLE) ×1
GOWN STRL REUS W/TWL XL LVL3 (GOWN DISPOSABLE) ×1
KIT MARKER MARGIN INK (KITS) IMPLANT
KIT TURNOVER KIT A (KITS) ×1 IMPLANT
MANIFOLD NEPTUNE II (INSTRUMENTS) ×1 IMPLANT
NDL HYPO 22X1.5 SAFETY MO (MISCELLANEOUS) ×1 IMPLANT
NEEDLE HYPO 22X1.5 SAFETY MO (MISCELLANEOUS) ×1 IMPLANT
PACK BASIN MINOR ARMC (MISCELLANEOUS) ×1 IMPLANT
SHEARS HARMONIC 9CM CVD (BLADE) IMPLANT
SHEATH BREAST BIOPSY SKIN MKR (SHEATH) IMPLANT
SPIKE FLUID TRANSFER (MISCELLANEOUS) ×1 IMPLANT
SPONGE KITTNER 5P (MISCELLANEOUS) IMPLANT
SUT MNCRL 4-0 (SUTURE) ×1
SUT MNCRL 4-0 27XMFL (SUTURE) ×1
SUT SILK 2 0 (SUTURE) ×1
SUT SILK 2-0 18XBRD TIE 12 (SUTURE) ×1 IMPLANT
SUT VIC AB 3-0 54X BRD REEL (SUTURE) ×1 IMPLANT
SUT VIC AB 3-0 BRD 54 (SUTURE) ×1
SUT VIC AB 3-0 SH 27 (SUTURE) ×1
SUT VIC AB 3-0 SH 27X BRD (SUTURE) ×1 IMPLANT
SUTURE MNCRL 4-0 27XMF (SUTURE) ×1 IMPLANT
SYR 10ML LL (SYRINGE) ×1 IMPLANT
TRAP FLUID SMOKE EVACUATOR (MISCELLANEOUS) ×1 IMPLANT
TRAP NEPTUNE SPECIMEN COLLECT (MISCELLANEOUS) ×1 IMPLANT
WATER STERILE IRR 1000ML POUR (IV SOLUTION) ×1 IMPLANT
WATER STERILE IRR 500ML POUR (IV SOLUTION) ×1 IMPLANT

## 2023-01-18 NOTE — Discharge Instructions (Signed)
AMBULATORY SURGERY  DISCHARGE INSTRUCTIONS   The drugs that you were given will stay in your system until tomorrow so for the next 24 hours you should not:  Drive an automobile Make any legal decisions Drink any alcoholic beverage   You may resume regular meals tomorrow.  Today it is better to start with liquids and gradually work up to solid foods.  You may eat anything you prefer, but it is better to start with liquids, then soup and crackers, and gradually work up to solid foods.   Please notify your doctor immediately if you have any unusual bleeding, trouble breathing, redness and pain at the surgery site, drainage, fever, or pain not relieved by medication.     Your post-operative visit with Dr.                                       is: Date:                        Time:    Please call to schedule your post-operative visit.  Additional Instructions: 

## 2023-01-18 NOTE — Interval H&P Note (Signed)
History and Physical Interval Note:  01/18/2023 9:46 AM  Felicia Butler  has presented today for surgery, with the diagnosis of invasive carcinoma right breast.  The various methods of treatment have been discussed with the patient and family. After consideration of risks, benefits and other options for treatment, the patient has consented to  Procedure(s): BREAST LUMPECTOMY WITH SENTINEL LYMPH NODE BX (Right) as a surgical intervention.  The patient's history has been reviewed, patient examined, no change in status, stable for surgery.  I have reviewed the patient's chart and labs.  Questions were answered to the patient's satisfaction.   The right side is marked.   Campbell Lerner

## 2023-01-18 NOTE — Op Note (Signed)
  Pre-operative Diagnosis: Breast Cancer, Right UOQ    Post-operative Diagnosis: Same  Surgeon: Campbell Lerner, M.D., [facility]  Anesthesia: Gen  Procedure: Right UOQ Lumpectomy, sentinel node biopsy  Procedure Details  The patient was seen again in the Holding Room. The benefits, complications, treatment options, and expected outcomes were discussed with the patient. The risks of bleeding, infection, recurrence of symptoms, failure to resolve symptoms, hematoma, seroma, open wound, cosmetic deformity, and the need for further surgery were discussed.  The patient was taken to Operating Room, identified as Felicia Butler and the procedure verified.  A Time Out was held and the above information confirmed.  Prior to the induction of general anesthesia, antibiotic prophylaxis was administered. VTE prophylaxis was in place. The patient was positioned in the supine position. Appropriate anesthesia was then administered and tolerated well. The LOCALizer is used to mark the skin for incision.  A visual dye Isosulfan Blue 4 ml was injected periareolar dermis early under aseptic conditions.  Massage was administered to this area for 5 minutes prior to securely taping it.  The chest was prepped with Chloraprep and draped in the sterile fashion.  Then using the hand-held probe an area of high counts was identified in the axilla, an incision was made and direction by the probe aided in dissection of 3 lymph nodes which was sent for permanent section.   Attention was turned to the UOQ mass where an incision was made. Dissection to perform a lumpectomy with adequate margins was performed. This was done with electrocautery. There was minimal bleeding, and the cavity packed.  The specimen was taken to the back table and painted to demarcate the 6 surfaces of potential margin.   I returned to the cavity to remove the packing, and hemostasis was confirmed with electrocautery.   Once assuring that hemostasis was adequate  and checked multiple times the wound was closed with interrupted 3-0 Vicryl followed by 4-0 subcuticular Monocryl sutures.  The axillary wound was closed prior to the lumpectomy in a similar fashion. Dermabond is utilized to seal the incisions.    Findings: Faxitron imaging: confirmed the ribbon clip to be central w/in the specimen.   Estimated Blood Loss: Minimal         Drains: None         Specimens:  UOQ Right breast mass, and 3 axillary sentinel LN       Complications: None         Condition: Stable  Sentinel Node Biopsy Synoptic Operative Report  Operation performed with curative intent:Yes  Tracer(s) used to identify sentinel nodes in the upfront surgery (non-neoadjuvant) setting (select all that apply):Dye and Radioactive Tracer  Tracer(s) used to identify sentinel nodes in the neoadjuvant setting (select all that apply):N/A  All nodes (colored or non-colored) present at the end of a dye-filled lymphatic channel were removed:Yes   All significantly radioactive nodes were removed:Yes  All palpable suspicious nodes were removed:Yes  Biopsy-proven positive nodes marked with clips prior to chemotherapy were identified and removed:N/A    Campbell Lerner, M.D., [facility]  01/18/2023 ; 12:11 PM

## 2023-01-18 NOTE — Anesthesia Postprocedure Evaluation (Signed)
Anesthesia Post Note  Patient: Felicia Butler  Procedure(s) Performed: BREAST LUMPECTOMY WITH SENTINEL LYMPH NODE BX (Right)  Patient location during evaluation: PACU Anesthesia Type: General Level of consciousness: awake and alert Pain management: pain level controlled Vital Signs Assessment: post-procedure vital signs reviewed and stable Respiratory status: spontaneous breathing, nonlabored ventilation and respiratory function stable Cardiovascular status: blood pressure returned to baseline and stable Postop Assessment: no apparent nausea or vomiting Anesthetic complications: no   No notable events documented.   Last Vitals:  Vitals:   01/18/23 1230 01/18/23 1253  BP: (!) 154/73 (!) 186/89  Pulse: 73 73  Resp: 13 18  Temp: 36.4 C (!) 36.1 C  SpO2: 94% 93%    Last Pain:  Vitals:   01/18/23 1253  TempSrc: Temporal  PainSc: 0-No pain                 Foye Deer

## 2023-01-18 NOTE — Anesthesia Procedure Notes (Signed)
Procedure Name: LMA Insertion Date/Time: 01/18/2023 10:19 AM  Performed by: Hezzie Bump, CRNAPre-anesthesia Checklist: Patient identified, Patient being monitored, Timeout performed, Emergency Drugs available and Suction available Patient Re-evaluated:Patient Re-evaluated prior to induction Oxygen Delivery Method: Circle system utilized Preoxygenation: Pre-oxygenation with 100% oxygen Induction Type: IV induction Ventilation: Mask ventilation without difficulty LMA: LMA inserted and LMA with gastric port inserted LMA Size: 3.0 Tube type: Oral Number of attempts: 1 Placement Confirmation: positive ETCO2 and breath sounds checked- equal and bilateral Tube secured with: Tape Dental Injury: Teeth and Oropharynx as per pre-operative assessment

## 2023-01-18 NOTE — Transfer of Care (Signed)
Immediate Anesthesia Transfer of Care Note  Patient: Felicia Butler  Procedure(s) Performed: BREAST LUMPECTOMY WITH SENTINEL LYMPH NODE BX (Right)  Patient Location: PACU  Anesthesia Type:General  Level of Consciousness: drowsy  Airway & Oxygen Therapy: Patient Spontanous Breathing and Patient connected to face mask oxygen  Post-op Assessment: Report given to RN and Post -op Vital signs reviewed and stable  Post vital signs: Reviewed and stable  Last Vitals:  Vitals Value Taken Time  BP 135/66 01/18/23 1202  Temp    Pulse 58 01/18/23 1204  Resp 20 01/18/23 1204  SpO2 96 % 01/18/23 1204  Vitals shown include unfiled device data.  Last Pain:  Vitals:   01/18/23 0942  TempSrc: Oral         Complications: No notable events documented.

## 2023-01-18 NOTE — Anesthesia Preprocedure Evaluation (Addendum)
Anesthesia Evaluation  Patient identified by MRN, date of birth, ID band Patient awake    Reviewed: Allergy & Precautions, NPO status , Patient's Chart, lab work & pertinent test results  Airway Mallampati: III  TM Distance: >3 FB Neck ROM: full    Dental  (+) Chipped   Pulmonary shortness of breath and with exertion, former smoker TB lung, latent   Pulmonary exam normal        Cardiovascular Exercise Tolerance: Good hypertension, pulmonary hypertension+ CAD   Rhythm:Regular Rate:Normal + Systolic murmurs- Peripheral Edema ECHO 11/2021: Summary   1. The left ventricle is normal in size with normal wall thickness.    2. The left ventricular systolic function is normal, LVEF is visually  estimated at > 55%.    3. There is grade II diastolic dysfunction (elevated filling pressure).    4. The right ventricle is normal in size, with normal systolic function.    5. There is moderate pulmonary hypertension.     Neuro/Psych negative neurological ROS  negative psych ROS   GI/Hepatic Neg liver ROS,GERD  Controlled,,  Endo/Other  Hypothyroidism    Renal/GU ESRF and DialysisRenal disease     Musculoskeletal  (+) Arthritis ,    Abdominal Normal abdominal exam  (+)   Peds  Hematology thrombocytopenia   Anesthesia Other Findings Past Medical History: No date: Anemia No date: Chronic kidney disease No date: Dialysis patient (HCC)     Comment:  M-W-F No date: GERD (gastroesophageal reflux disease) No date: Gout No date: Hernia No date: Hyperlipidemia No date: Hypertension No date: Hypothyroidism No date: Moderate pulmonary hypertension (HCC) No date: TB lung, latent No date: Thyroid disease  Past Surgical History: 02/01/2022: A/V FISTULAGRAM; N/A     Comment:  Procedure: A/V Fistulagram;  Surgeon: Renford Dills, MD;  Location: [facility];  Service:               Cardiovascular;   Laterality: N/A; 01/18/2017: AV FISTULA PLACEMENT; Left     Comment:  Procedure: ARTERIOVENOUS (AV) FISTULA CREATION (               BRACHIAL CEPHALIC );  Surgeon: Renford Dills, MD;                Location: [facility] ORS;  Service: Vascular;  Laterality:               Left; 04/30/2013: BREAST BIOPSY; Right     Comment:  intraductal papilloma 04/30/2013: BREAST BIOPSY; Right     Comment:  cyst aspiration 05/27/2013: BREAST BIOPSY; Right     Comment:  subareolar duct excision 05/27/2013: BREAST BIOPSY; Left     Comment:  subareolar duct excision 08/11/2016: BREAST BIOPSY; Left     Comment:  path pending 06/28/2018: COLONOSCOPY 02/01/2022: DIALYSIS/PERMA CATHETER INSERTION; N/A     Comment:  Procedure: DIALYSIS/PERMA CATHETER INSERTION;  Surgeon:               Renford Dills, MD;  Location: [facility];               Service: Cardiovascular;  Laterality: N/A; 1960's: HERNIA REPAIR No date: PARTIAL HYSTERECTOMY     Comment:  1980's 04/18/2017: PERIPHERAL VASCULAR THROMBECTOMY; Left     Comment:  Procedure: PERIPHERAL VASCULAR THROMBECTOMY;  Surgeon:               [name]  G, MD;  Location: [facility];               Service: Cardiovascular;  Laterality: Left;  BMI    Body Mass Index: 27.40 kg/m      Reproductive/Obstetrics negative OB ROS                              Anesthesia Physical Anesthesia Plan  ASA: 3  Anesthesia Plan: General   Post-op Pain Management: Tylenol PO (pre-op)* and Regional block*   Induction: Intravenous  PONV Risk Score and Plan: Ondansetron, Dexamethasone and Midazolam  Airway Management Planned: LMA  Additional Equipment:   Intra-op Plan:   Post-operative Plan: Extubation in OR  Informed Consent: I have reviewed the patients History and Physical, chart, labs and discussed the procedure including the risks, benefits and alternatives for the proposed anesthesia with the patient or  authorized representative who has indicated his/her understanding and acceptance.     Dental advisory given  Plan Discussed with: Anesthesiologist, CRNA and Surgeon  Anesthesia Plan Comments:          Anesthesia Quick Evaluation

## 2023-01-19 ENCOUNTER — Encounter: Payer: Self-pay | Admitting: Surgery

## 2023-01-20 ENCOUNTER — Other Ambulatory Visit: Payer: Self-pay | Admitting: Pathology

## 2023-01-20 ENCOUNTER — Encounter: Payer: Self-pay | Admitting: *Deleted

## 2023-01-20 NOTE — Progress Notes (Signed)
OncotypeDx order submitted online.

## 2023-01-23 ENCOUNTER — Telehealth: Payer: Self-pay | Admitting: Licensed Clinical Social Worker

## 2023-01-23 ENCOUNTER — Encounter: Payer: Self-pay | Admitting: Licensed Clinical Social Worker

## 2023-01-23 ENCOUNTER — Ambulatory Visit: Payer: Self-pay | Admitting: Licensed Clinical Social Worker

## 2023-01-23 DIAGNOSIS — Z1379 Encounter for other screening for genetic and chromosomal anomalies: Secondary | ICD-10-CM | POA: Insufficient documentation

## 2023-01-23 NOTE — Telephone Encounter (Signed)
I contacted Ms. Scarberry to discuss her genetic testing results. No pathogenic variants were identified in the 48 genes analyzed. Detailed clinic note to follow.   The test report has been scanned into EPIC and is located under the Molecular Pathology section of the Results Review tab.  A portion of the result report is included below for reference.      Lacy Duverney, MS, [facility] Genetic Counselor [email or web address] Phone: [phone]

## 2023-01-23 NOTE — Progress Notes (Signed)
HPI:   Felicia Butler was previously seen in the [facility] due to a personal and family history of cancer and concerns regarding a hereditary predisposition to cancer. Please refer to our prior cancer genetics clinic note for more information regarding our discussion, assessment and recommendations, at the time. Felicia Butler recent genetic test results were disclosed to her, as were recommendations warranted by these results. These results and recommendations are discussed in more detail below.  CANCER HISTORY:  Oncology History  Invasive carcinoma of breast (HCC)  12/07/2022 Mammogram   Bilateral screening mammogram showed possible right breast distortion   12/13/2022 Mammogram   Unilateral right diagnostic mammogram showed right breast 10 mm irregular mass in the 10 o'clock position suspicious for malignancy.  Right axillary ultrasound was performed and demonstrated morphologically benign lymph nodes.  No right axillary lymphadenopathy.   12/27/2022 Initial Diagnosis   Invasive carcinoma of breast (HCC)  Breast, right, needle core biopsy, 10 ocl, 7cmfn, mass - INVASIVE MAMMARY CARCINOMA WITH TUBULAR FEATURES, SEE NOTE - DUCTAL CARCINOMA IN SITU (DCIS): PRESENT, FOCAL, GRADE 2 - TUBULE FORMATION: SCORE 1 - NUCLEAR PLEOMORPHISM: SCORE 2 - MITOTIC COUNT: SCORE 1 - TOTAL SCORE: 4 - OVERALL GRADE: 1 - LYMPHOVASCULAR INVASION: NOT IDENTIFIED - CANCER LENGTH: 0.6 CM - CALCIFICATIONS: PRESENT, FOCAL  ER 100%+, PR 100%, HER2 negative.  (1+)   01/03/2023 Cancer Staging   Staging form: Breast, AJCC 8th Edition - Clinical stage from 01/03/2023: cT1b, cN0, cM0, ER+, PR+, HER2- - Signed by Rickard Patience, MD on 01/03/2023 Stage prefix: Initial diagnosis    Genetic Testing   Negative genetic testing. No pathogenic variants identified on the [facility] Common Hereditary Cancers+RNA panel. The report date is 01/18/2023.  The Common Hereditary Cancers Panel + RNA offered by [facility] includes  sequencing and/or deletion duplication testing of the following 48 genes: APC*, ATM*, AXIN2, BAP1, BARD1, BMPR1A, BRCA1, BRCA2, BRIP1, CDH1, CDK4, CDKN2A (p14ARF), CDKN2A (p16INK4a), CHEK2, CTNNA1, DICER1*, EPCAM*, FH*, GREM1*, HOXB13, KIT, MBD4, MEN1*, MLH1*, MSH2*, MSH3*, MSH6*, MUTYH, NF1*, NTHL1, PALB2, PDGFRA, PMS2*, POLD1*, POLE, PTEN*, RAD51C, RAD51D, SDHA*, SDHB, SDHC*, SDHD, SMAD4, SMARCA4, STK11, TP53, TSC1*, TSC2, VHL.      FAMILY HISTORY:  We obtained a detailed, 4-generation family history.  Significant diagnoses are listed below: Family History  Problem Relation Age of Onset   Uterine cancer Mother 32   Hypertension Father    Diabetes Father    Stroke Father    Kidney failure Father    Cancer Maternal Grandmother        unk type   Pancreatic cancer Cousin    Pancreatic cancer Cousin    Breast cancer Neg Hx    Felicia Butler has 1 son. She has 6 brothers and 5 sisters, no cancers.   Felicia Butler's mother had uterine cancer at 58 and passed at 65. Two maternal cousins (who were siblings) both died of pancreatic cancer. Maternal grandmother had cancer, unknown type.   Felicia Butler's father died at 41. She has limited information about his side of the family, possible cancers in aunts/uncles.   Felicia Butler is unaware of previous family history of genetic testing for hereditary cancer risks. There is no reported Ashkenazi Jewish ancestry. There is no known consanguinity.      GENETIC TEST RESULTS:  The [facility] Common Hereditary Cancers+RNA Panel found no pathogenic mutations.   The Common Hereditary Cancers Panel + RNA offered by [facility] includes sequencing and/or deletion duplication testing of the following 48 genes: APC*,  ATM*, AXIN2, BAP1, BARD1, BMPR1A, BRCA1, BRCA2, BRIP1, CDH1, CDK4, CDKN2A (p14ARF), CDKN2A (p16INK4a), CHEK2, CTNNA1, DICER1*, EPCAM*, FH*, GREM1*, HOXB13, KIT, MBD4, MEN1*, MLH1*, MSH2*, MSH3*, MSH6*, MUTYH, NF1*, NTHL1, PALB2, PDGFRA, PMS2*, POLD1*, POLE,  PTEN*, RAD51C, RAD51D, SDHA*, SDHB, SDHC*, SDHD, SMAD4, SMARCA4, STK11, TP53, TSC1*, TSC2, VHL.   The test report has been scanned into EPIC and is located under the Molecular Pathology section of the Results Review tab.  A portion of the result report is included below for reference. Genetic testing reported out on 01/18/2023.     Even though a pathogenic variant was not identified, possible explanations for the cancer in the family may include: There may be no hereditary risk for cancer in the family. The cancers in Felicia Butler and/or her family may be sporadic/familial or due to other genetic and environmental factors. There may be a gene mutation in one of these genes that current testing methods cannot detect but that chance is small. There could be another gene that has not yet been discovered, or that we have not yet tested, that is responsible for the cancer diagnoses in the family.  It is also possible there is a hereditary cause for the cancer in the family that Felicia Butler did not inherit.  Therefore, it is important to remain in touch with cancer genetics in the future so that we can continue to offer Felicia Butler the most up to date genetic testing.   ADDITIONAL GENETIC TESTING:  We discussed with Felicia Butler that her genetic testing was fairly extensive.  If there are additional relevant genes identified to increase cancer risk that can be analyzed in the future, we would be happy to discuss and coordinate this testing at that time.    CANCER SCREENING RECOMMENDATIONS:  Felicia Butler test result is considered negative (normal).  This means that we have not identified a hereditary cause for her personal and family history of cancer at this time.   An individual's cancer risk and medical management are not determined by genetic test results alone. Overall cancer risk assessment incorporates additional factors, including personal medical history, family history, and any available genetic  information that may result in a personalized plan for cancer prevention and surveillance. Therefore, it is recommended she continue to follow the cancer management and screening guidelines provided by her oncology and primary healthcare provider.  RECOMMENDATIONS FOR FAMILY MEMBERS:   Since she did not inherit a identifiable mutation in a cancer predisposition gene included on this panel, her children could not have inherited a known mutation from her in one of these genes. Individuals in this family might be at some increased risk of developing cancer, over the general population risk, due to the family history of cancer.  Individuals in the family should notify their providers of the family history of cancer. We recommend women in this family have a yearly mammogram beginning at age 66, or 31 years younger than the earliest onset of cancer, an annual clinical breast exam, and perform monthly breast self-exams.  Family members should have colonoscopies by at age 19, or earlier, as recommended by their providers. Other members of the family may still carry a pathogenic variant in one of these genes that Felicia Butler did not inherit. Based on the family history, we recommend those related to her cousins with pancreatic cancer have genetic counseling and testing. Ms. Gabelman will let us know if we can be of any assistance in coordinating genetic counseling and/or testing for this family member.  FOLLOW-UP:  Lastly, we discussed with Felicia Butler that cancer genetics is a rapidly advancing field and it is possible that new genetic tests will be appropriate for her and/or her family members in the future. We encouraged her to remain in contact with cancer genetics on an annual basis so we can update her personal and family histories and let her know of advances in cancer genetics that may benefit this family.   Our contact number was provided. Felicia Butler questions were answered to her satisfaction, and she  knows she is welcome to call us at anytime with additional questions or concerns.    Lacy Duverney, MS, [facility] Genetic Counselor [email or web address] Phone: [phone]

## 2023-01-26 ENCOUNTER — Encounter: Payer: Self-pay | Admitting: Surgery

## 2023-01-26 ENCOUNTER — Ambulatory Visit (INDEPENDENT_AMBULATORY_CARE_PROVIDER_SITE_OTHER): Payer: 59 | Admitting: Surgery

## 2023-01-26 VITALS — BP 157/70 | HR 87 | Temp 98.0°F | Ht 61.0 in | Wt 141.6 lb

## 2023-01-26 DIAGNOSIS — L7634 Postprocedural seroma of skin and subcutaneous tissue following other procedure: Secondary | ICD-10-CM

## 2023-01-26 DIAGNOSIS — C50411 Malignant neoplasm of upper-outer quadrant of right female breast: Secondary | ICD-10-CM

## 2023-01-26 DIAGNOSIS — N6489 Other specified disorders of breast: Secondary | ICD-10-CM | POA: Insufficient documentation

## 2023-01-26 DIAGNOSIS — Z09 Encounter for follow-up examination after completed treatment for conditions other than malignant neoplasm: Secondary | ICD-10-CM

## 2023-01-26 DIAGNOSIS — C50919 Malignant neoplasm of unspecified site of unspecified female breast: Secondary | ICD-10-CM

## 2023-01-26 NOTE — Patient Instructions (Addendum)
Continue to wear a well fitting bra. Follow up with Dr Cathie Hoops and Dr Rushie Chestnut.  Bonita Quin may use heat to the area to help with the discomfort.    Follow up here in September.

## 2023-01-26 NOTE — Progress Notes (Signed)
[facility] POST-OP OFFICE VISIT  01/26/2023  HPI: Felicia Butler is a 62 y.o. female 8 days s/p right upper outer quadrant breast lumpectomy, and sentinel lymph node biopsy. She presents today with no complaints outside of some swelling and soreness.  She has had no drainage from incisions, denies any fevers or chills.  Diagnosis 1. 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 - SEE CANCER SUMMARY BELOW. - BIOPSY SITE CHANGE WITH CLIP. - INTRADUCTAL PAPILLOMA WITH FLORID DUCTAL HYPERPLASIA, COLUMNAR CELL CHANGE, CYST FORMATION, AND ATYPICAL LOBULAR HYPERPLASIA. 2. Lymph node, sentinel, biopsy, #1 Right - ONE LYMPH NODE NEGATIVE FOR MALIGNANCY (0/1). 3. Lymph node, sentinel, biopsy, #2 Right - ONE LYMPH NODE NEGATIVE FOR MALIGNANCY (0/1). 4. Lymph node, sentinel, biopsy, #3 Right - ONE LYMPH NODE NEGATIVE FOR MALIGNANCY (0/1). Microscopic Comment 1. CANCER CASE SUMMARY: INVASIVE CARCINOMA OF THE BREAST Standard(s): AJCC-UICC 8 SPECIMEN Procedure: Lumpectomy Specimen Laterality: Right TUMOR Histologic Type: Tubular carcinoma Histologic Grade (Nottingham Histologic Score) Glandular (Acinar)/Tubular Differentiation: 1 Nuclear Pleomorphism: 2 Mitotic Rate: 1 Overall Grade: 1 Tumor Size: Greatest dimension of largest invasive focus: 13 mm Ductal Carcinoma In Situ (DCIS): Present, intermediate grade  Tumor Extent: Not applicable Lymphatic and/or Vascular Invasion: Not identified Treatment Effect in the Breast: No known presurgical therapy MARGINS Margin Status for Invasive Carcinoma: All margins negative for invasive carcinoma Distance from closest margin: 2 mm Specify closest margin: Superior Margin Status for DCIS: All margins negative for DCIS Distance from DCIS to closest margin: 1 mm Specify closest margin: Superior REGIONAL LYMPH NODES Regional Lymph Node Status: All regional lymph nodes negative  for tumor Total Number of Lymph Nodes Examined (sentinel and non-sentinel): 3 Number of Sentinel Nodes Examined: 3 DISTANT METASTASIS Distant Site(s) Involved, if applicable: Not applicable PATHOLOGIC STAGE CLASSIFICATION (pTNM, AJCC 8th Edition): Modified Classification: Not applicable pT Category: pT1c T Suffix: Not applicable pN Category: pN0 N Suffix: (sn) pM Category: Not applicable SPECIAL STUDIES Breast Biomarker Testing Performed on Previous Biopsy: [id number] Estrogen Receptor: Positive (100%, strong) Progesterone Receptor: Positive (100%, strong) HER2 IHC: Negative (1+)   Vital signs: BP (!) 157/70   Pulse 87   Temp 98 F (36.7 C)   Ht 5\' 1"  (1.549 m)   Wt 141 lb 9.6 oz (64.2 kg)   LMP 11/18/1996 (Approximate)   SpO2 97%   BMI 26.76 kg/m    Physical Exam: Constitutional: She appears well.   Skin/Breast: Her lumpectomy site is quite prominent with a good sized seroma probably at least 7 to 8 cm in diameter.  This is no great surprise considering the the lax dermal envelope about her ptotic right breast.  The incision to the axilla and upper outer quadrant of the right breast both appear clean dry and intact.  There is some peau d'orange-like changes of the dermal skin but I believe this is due to the dependency of the breast, and the interruption of the draining lymphatics of the upper outer quadrant.  I do not believe this is evidence of inflammatory cancer.  She really does not complain much of this at all.  Assessment/Plan: This is a 62 y.o. female 8 days s/p lumpectomy of the upper outer quadrant right breast and sentinel lymph node biopsy right axilla.  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  Hypercalcemia 06/03/2022   Chronic kidney disease (CKD), stage III (moderate) (HCC) 06/03/2022    Hypertension 06/03/2022   Allergy, unspecified, initial encounter 01/30/2022   Anaphylactic shock, unspecified, initial encounter 01/30/2022   Mixed hyperlipidemia 01/06/2021   Contact with and (suspected) exposure to covid-19 11/06/2020   Encounter for screening for COVID-19 11/06/2020   CAD in native artery 09/10/2020   Complication of vascular access for dialysis 08/19/2020   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   Gout 02/27/2020   Hyperlipidemia 02/27/2020   Hypothyroidism 02/27/2020   Obesity 02/27/2020   TB lung, 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   Other disorders of phosphorus metabolism 11/07/2018   COVID-19 10/15/2018   Hyperkalemia 08/08/2018   Anemia in chronic kidney disease 04/10/2017   Coagulation defect, unspecified (HCC) 04/10/2017   Diarrhea, unspecified 04/10/2017   Encounter for immunization 04/10/2017   End stage renal disease (HCC) 04/10/2017   Fever, unspecified 04/10/2017   Headache, unspecified 04/10/2017   Iron deficiency anemia, unspecified 04/10/2017   Moderate protein-calorie malnutrition (HCC) 04/10/2017   Pain, unspecified 04/10/2017   Pruritus, unspecified 04/10/2017   Secondary hyperparathyroidism of renal origin (HCC) 04/10/2017   Shortness of breath 04/10/2017   Type 2 diabetes mellitus with diabetic chronic kidney disease (HCC) 04/10/2017   Hypertensive chronic kidney disease with stage 1 through stage 4 chronic kidney disease, or unspecified chronic kidney disease 04/10/2017   Chronic renal insufficiency 12/18/2016   Essential hypertension 12/18/2016   Intraductal papilloma of breast 05/16/2013   Proteinuria 10/07/2012    -This hematoma/seroma of the upper outer quadrant of the right breast will likely be more amenable to percutaneous drainage with time.  At present she is reluctant to consider anything more aggressive.  She will have follow-up with radiation oncology doctor, Dr. Rushie Chestnut and Dr. Cathie Hoops during my absence in the second week of  September. I will anticipate seeing her later in the month.   Campbell Lerner M.D., FACS 01/26/2023, 1:56 PM

## 2023-02-06 ENCOUNTER — Inpatient Hospital Stay: Payer: 59 | Attending: Oncology

## 2023-02-06 DIAGNOSIS — Z79899 Other long term (current) drug therapy: Secondary | ICD-10-CM | POA: Insufficient documentation

## 2023-02-06 DIAGNOSIS — Z87891 Personal history of nicotine dependence: Secondary | ICD-10-CM | POA: Insufficient documentation

## 2023-02-06 DIAGNOSIS — N186 End stage renal disease: Secondary | ICD-10-CM | POA: Insufficient documentation

## 2023-02-06 DIAGNOSIS — I5032 Chronic diastolic (congestive) heart failure: Secondary | ICD-10-CM | POA: Insufficient documentation

## 2023-02-06 DIAGNOSIS — Z170 Estrogen receptor positive status [ER+]: Secondary | ICD-10-CM | POA: Insufficient documentation

## 2023-02-06 DIAGNOSIS — D631 Anemia in chronic kidney disease: Secondary | ICD-10-CM | POA: Insufficient documentation

## 2023-02-06 DIAGNOSIS — I132 Hypertensive heart and chronic kidney disease with heart failure and with stage 5 chronic kidney disease, or end stage renal disease: Secondary | ICD-10-CM | POA: Insufficient documentation

## 2023-02-06 DIAGNOSIS — C50411 Malignant neoplasm of upper-outer quadrant of right female breast: Secondary | ICD-10-CM | POA: Insufficient documentation

## 2023-02-07 ENCOUNTER — Ambulatory Visit: Payer: 59 | Admitting: Oncology

## 2023-02-07 ENCOUNTER — Encounter: Payer: Self-pay | Admitting: Oncology

## 2023-02-07 ENCOUNTER — Ambulatory Visit
Admission: RE | Admit: 2023-02-07 | Discharge: 2023-02-07 | Disposition: A | Discharge status: Home / Self Care | Payer: 59 | Source: Ambulatory Visit | Attending: Radiation Oncology | Admitting: Radiation Oncology

## 2023-02-07 ENCOUNTER — Inpatient Hospital Stay (HOSPITAL_BASED_OUTPATIENT_CLINIC_OR_DEPARTMENT_OTHER): Payer: 59 | Admitting: Oncology

## 2023-02-07 VITALS — BP 162/76 | HR 77 | Temp 98.3°F | Resp 18 | Wt 138.4 lb

## 2023-02-07 DIAGNOSIS — D631 Anemia in chronic kidney disease: Secondary | ICD-10-CM | POA: Insufficient documentation

## 2023-02-07 DIAGNOSIS — I132 Hypertensive heart and chronic kidney disease with heart failure and with stage 5 chronic kidney disease, or end stage renal disease: Secondary | ICD-10-CM | POA: Insufficient documentation

## 2023-02-07 DIAGNOSIS — I2510 Atherosclerotic heart disease of native coronary artery without angina pectoris: Secondary | ICD-10-CM | POA: Insufficient documentation

## 2023-02-07 DIAGNOSIS — Z809 Family history of malignant neoplasm, unspecified: Secondary | ICD-10-CM | POA: Insufficient documentation

## 2023-02-07 DIAGNOSIS — Z170 Estrogen receptor positive status [ER+]: Secondary | ICD-10-CM | POA: Insufficient documentation

## 2023-02-07 DIAGNOSIS — I5032 Chronic diastolic (congestive) heart failure: Secondary | ICD-10-CM | POA: Insufficient documentation

## 2023-02-07 DIAGNOSIS — C50919 Malignant neoplasm of unspecified site of unspecified female breast: Secondary | ICD-10-CM | POA: Diagnosis not present

## 2023-02-07 DIAGNOSIS — E785 Hyperlipidemia, unspecified: Secondary | ICD-10-CM | POA: Insufficient documentation

## 2023-02-07 DIAGNOSIS — N186 End stage renal disease: Secondary | ICD-10-CM | POA: Insufficient documentation

## 2023-02-07 DIAGNOSIS — Z79899 Other long term (current) drug therapy: Secondary | ICD-10-CM | POA: Insufficient documentation

## 2023-02-07 DIAGNOSIS — I272 Pulmonary hypertension, unspecified: Secondary | ICD-10-CM | POA: Insufficient documentation

## 2023-02-07 DIAGNOSIS — Z87891 Personal history of nicotine dependence: Secondary | ICD-10-CM | POA: Insufficient documentation

## 2023-02-07 DIAGNOSIS — N6489 Other specified disorders of breast: Secondary | ICD-10-CM | POA: Insufficient documentation

## 2023-02-07 DIAGNOSIS — K219 Gastro-esophageal reflux disease without esophagitis: Secondary | ICD-10-CM | POA: Insufficient documentation

## 2023-02-07 DIAGNOSIS — E039 Hypothyroidism, unspecified: Secondary | ICD-10-CM | POA: Insufficient documentation

## 2023-02-07 DIAGNOSIS — C50411 Malignant neoplasm of upper-outer quadrant of right female breast: Secondary | ICD-10-CM | POA: Insufficient documentation

## 2023-02-07 DIAGNOSIS — Z7189 Other specified counseling: Secondary | ICD-10-CM | POA: Diagnosis not present

## 2023-02-07 DIAGNOSIS — Z79890 Hormone replacement therapy (postmenopausal): Secondary | ICD-10-CM | POA: Insufficient documentation

## 2023-02-07 DIAGNOSIS — Z992 Dependence on renal dialysis: Secondary | ICD-10-CM

## 2023-02-07 NOTE — Progress Notes (Signed)
Pt here for follow up. Pt reports soreness to right breast, due to surgery.

## 2023-02-07 NOTE — Progress Notes (Signed)
Hematology/Oncology Consult Note [phone] [phone]     REFERRING PROVIDER: Resa Miner, MD    CHIEF COMPLAINTS/PURPOSE OF CONSULTATION:  Right breast invasive carcinoma, ER PR positive, HER2 negative  ASSESSMENT & PLAN:   Cancer Staging  Invasive carcinoma of breast (HCC) Staging form: Breast, AJCC 8th Edition - Clinical stage from 01/03/2023: cT1b, cN0, cM0, ER+, PR+, HER2- - Signed by Rickard Patience, MD on 01/03/2023   Invasive carcinoma of breast [facility]) Pathology was discussed with the patient, pT1c pN0  ER+ PR+ HER2 negative Oncotype Dx recurrence score 3, no benefit of chemotherapy.  Recommend Adjuvant radiation - she has appt with Radonc Recommend Adjuvant antiestrogen therapy, she is post menopausal, consider AI.  Obtain DEXA   Anemia in chronic kidney disease Hemoglobin stable.  Goals of care, counseling/discussion Curable intent. Discussed with patient.    Orders Placed This Encounter  Procedures   DG Bone Density    Standing Status:   Future    Standing Expiration Date:   02/07/2024    Order Specific Question:   Reason for Exam (SYMPTOM  OR DIAGNOSIS REQUIRED)    Answer:   Breast cancer    Order Specific Question:   Preferred imaging location?    Answer:   [facility]   Follow-up to be determined. 2-3 weeks after radiation.  All questions were answered. The patient knows to call the clinic with any problems, questions or concerns.  Rickard Patience, MD, PhD [facility] Hematology Oncology 02/07/2023    HISTORY OF PRESENTING ILLNESS:  Felicia Butler 62 y.o. female presents to establish care for right breast cancer I have reviewed her chart and materials related to her cancer extensively and collaborated history with the patient. Summary of oncologic history is as follows: Oncology History  Invasive carcinoma of breast (HCC)  12/07/2022 Mammogram   Bilateral screening mammogram showed possible right breast distortion   12/13/2022  Mammogram   Unilateral right diagnostic mammogram showed right breast 10 mm irregular mass in the 10 o'clock position suspicious for malignancy.  Right axillary ultrasound was performed and demonstrated morphologically benign lymph nodes.  No right axillary lymphadenopathy.   12/27/2022 Initial Diagnosis   Invasive carcinoma of breast (HCC)  Breast, right, needle core biopsy, 10 ocl, 7cmfn, mass - INVASIVE MAMMARY CARCINOMA WITH TUBULAR FEATURES, SEE NOTE - DUCTAL CARCINOMA IN SITU (DCIS): PRESENT, FOCAL, GRADE 2 - TUBULE FORMATION: SCORE 1 - NUCLEAR PLEOMORPHISM: SCORE 2 - MITOTIC COUNT: SCORE 1 - TOTAL SCORE: 4 - OVERALL GRADE: 1 - LYMPHOVASCULAR INVASION: NOT IDENTIFIED - CANCER LENGTH: 0.6 CM - CALCIFICATIONS: PRESENT, FOCAL  ER 100%+, PR 100%, HER2 negative.  (1+)   01/03/2023 Cancer Staging   Staging form: Breast, AJCC 8th Edition - Clinical stage from 01/03/2023: cT1b, cN0, cM0, ER+, PR+, HER2- - Signed by Rickard Patience, MD on 01/03/2023 Stage prefix: Initial diagnosis    Genetic Testing   Negative genetic testing. No pathogenic variants identified on the [facility] Common Hereditary Cancers+RNA panel. The report date is 01/18/2023.  The Common Hereditary Cancers Panel + RNA offered by [facility] includes sequencing and/or deletion duplication testing of the following 48 genes: APC*, ATM*, AXIN2, BAP1, BARD1, BMPR1A, BRCA1, BRCA2, BRIP1, CDH1, CDK4, CDKN2A (p14ARF), CDKN2A (p16INK4a), CHEK2, CTNNA1, DICER1*, EPCAM*, FH*, GREM1*, HOXB13, KIT, MBD4, MEN1*, MLH1*, MSH2*, MSH3*, MSH6*, MUTYH, NF1*, NTHL1, PALB2, PDGFRA, PMS2*, POLD1*, POLE, PTEN*, RAD51C, RAD51D, SDHA*, SDHB, SDHC*, SDHD, SMAD4, SMARCA4, STK11, TP53, TSC1*, TSC2, VHL.    01/18/2023 Surgery   Pt underwent right lumpectomy and  SLNB  1. 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 - SEE CANCER SUMMARY BELOW. - BIOPSY SITE CHANGE WITH CLIP. - INTRADUCTAL PAPILLOMA WITH FLORID  DUCTAL HYPERPLASIA, COLUMNAR CELL CHANGE, CYST FORMATION, AND ATYPICAL LOBULAR HYPERPLASIA. 2. Lymph node, sentinel, biopsy, #1 Right - ONE LYMPH NODE NEGATIVE FOR MALIGNANCY (0/1). 3. Lymph node, sentinel, biopsy, #2 Right - ONE LYMPH NODE NEGATIVE FOR MALIGNANCY (0/1). 4. Lymph node, sentinel, biopsy, #3 Right - ONE LYMPH NODE NEGATIVE FOR MALIGNANCY (0/1).   02/06/2023 Oncotype testing   Oncotype Dx 3, distant recurrence risk at 9 years 3%, benefit from chemotherapy <1%    Menarche at age of 3 First live birth at age of 23 OCP use: Less than 5 years History of hysterectomy: 25s. Menopausal status: Probably postmenopausal History of HRT use: No History of chest radiation: No Number of previous breast biopsies: 2014 bilateral breast biopsy, papillomas. Family history: No breast cancer.  Mother endometrial cancer, maternal cousin pancreatic cancer  Patient has ESRD on hemodialysis. Today she presents for follow up. S/p right breast lumpectomy and SLNB + soreness at surgical sit.    MEDICAL HISTORY:  Past Medical History:  Diagnosis Date   (HFpEF) heart failure with preserved ejection fraction (HCC) 12/09/2021   a.) TTE 12/09/2021: EF >55%, no RWMAs, GLS -16.8%, mild LAE, mod pHTN (RVSP 52), G2DD   Anemia associated with chronic renal failure    Aortic atherosclerosis (HCC)    Arthritis    Atherosclerosis of iliac artery    Coronary artery disease    a.) MV 11/28/2019: no sig CAD, no ischemia; b.) MV 09/21/2021: no sig CAD, no ischemia   DDD (degenerative disc disease), lumbar    Dyspnea    ESRD (end stage renal disease) on dialysis [facility])    a.) started HD on M-W-F in 2018   FSGS (focal segmental glomerulosclerosis) 1987   a.) diagnosed by Bx in 1987   GERD (gastroesophageal reflux disease)    Gout    Heart murmur    Hyperlipidemia    Hypertension    Hypothyroidism    Invasive carcinoma of RIGHT breast (HCC) 12/27/2022   a.) CNB of RIGHT breast mass on 12/27/2022  --> invasive mammary carcinoma (cT1b, cN0, cM0); ER/PR (+), HER2/neu (-).   Prolonged QT interval 05/03/2022   a.) ECG 05/03/2022: QTc = 511 ms; b.) ECG 01/13/2023: QTc = 577 ms   Pulmonary HTN (HCC) 12/09/2021   a.) TTE 12/09/2021: RVSP 52 mmHg   TB lung, latent    a.) (+) PPD 09/2011. CXR (-). Declined INH due to potential for hepatotoxicity.   Umbilical hernia     SURGICAL HISTORY: Past Surgical History:  Procedure Laterality Date   A/V FISTULAGRAM N/A 10/10/2022   Procedure: A/V Fistulagram;  Surgeon: Annice Needy, MD;  Location: [facility] INVASIVE CV LAB;  Service: Cardiovascular;  Laterality: N/A;   A/V FISTULAGRAM N/A 02/01/2022   Procedure: A/V Fistulagram;  Surgeon: Renford Dills, MD;  Location: [facility] INVASIVE CV LAB;  Service: Cardiovascular;  Laterality: N/A;   A/V FISTULAGRAM Left 11/08/2022   Procedure: A/V Fistulagram;  Surgeon: Renford Dills, MD;  Location: [facility] INVAS[facility] CV LAB;  Service: Cardiovascular;  Laterality: Left;   ABDOMINAL HYSTERECTOMY     AV FISTULA PLACEMENT Left 01/18/2017   Procedure: ARTERIOVENOUS (AV) FISTULA CREATION ( BRACHIAL CEPHALIC );  Surgeon: Renford Dills, MD;  Location: [facility] ORS;  Service: Vascular;  Laterality: Left;   BREAST BIOPSY Right 04/30/2013  intraductal papilloma   BREAST BIOPSY Right 04/30/2013   cyst aspiration   BREAST BIOPSY Right 05/27/2013   subareolar duct excision   BREAST BIOPSY Left 05/27/2013   subareolar duct excision   BREAST BIOPSY Left 08/11/2016   benign   BREAST BIOPSY Right 12/27/2022   [place] bx/ ribbon clip/ path pending   BREAST BIOPSY Right 12/27/2022   [place] RT BREAST BX W LOC DEV 1ST LESION IMG BX SPEC US GUIDE 12/27/2022 [facility]   BREAST LUMPECTOMY WITH SENTINEL LYMPH NODE BIOPSY Right 01/18/2023   Procedure: BREAST LUMPECTOMY WITH SENTINEL LYMPH NODE BX;  Surgeon: Campbell Lerner, MD;  Location: [facility] ORS;  Service: General;  Laterality: Right;   COLONOSCOPY  06/28/2018   DIALYSIS/PERMA  CATHETER INSERTION N/A 02/01/2022   Procedure: DIALYSIS/PERMA CATHETER INSERTION;  Surgeon: Renford Dills, MD;  Location: [facility];  Service: Cardiovascular;  Laterality: N/A;   DIALYSIS/PERMA CATHETER REMOVAL N/A 07/28/2022   Procedure: DIALYSIS/PERMA CATHETER REMOVAL;  Surgeon: Annice Needy, MD;  Location: [facility];  Service: Cardiovascular;  Laterality: N/A;   HERNIA REPAIR  1960's   PARTIAL HYSTERECTOMY     1980's   PERIPHERAL VASCULAR THROMBECTOMY Left 04/18/2017   Procedure: PERIPHERAL VASCULAR THROMBECTOMY;  Surgeon: Renford Dills, MD;  Location: [facility];  Service: Cardiovascular;  Laterality: Left;   REVISION OF ARTERIOVENOUS GORETEX GRAFT Left 10/09/2022   Procedure: REVISION OF ARTERIOVENOUS FISTULA;  Surgeon: Nada Libman, MD;  Location: [facility] ORS;  Service: Vascular;  Laterality: Left;   REVISON OF ARTERIOVENOUS FISTULA Left 05/06/2022   Procedure: REVISON OF ARTERIOVENOUS FISTULA (BRACHIAL CEPHALIC);  Surgeon: Renford Dills, MD;  Location: [facility] OR[facility];  Service: Vascular;  Laterality: Left;    SOCIAL HISTORY: Social History   Socioeconomic History   Marital status: Divorced    Spouse name: Not on file   Number of children: 1   Years of education: Not on file   Highest education level: Not on file  Occupational History   Not on file  Tobacco Use   Smoking status: Former    Current packs/day: 0.00    Types: Cigarettes    Start date: 05/30/1978    Quit date: 05/31/1983    Years since quitting: 39.7    Passive exposure: Past   Smokeless tobacco: Never  Vaping Use   Vaping status: Never Used  Substance and Sexual Activity   Alcohol use: No   Drug use: No   Sexual activity: Not on file  Other Topics Concern   Not on file  Social History Narrative   Lives with sister and son   Social Determinants of Health   Financial Resource Strain: Not on file  Food Insecurity: No Food Insecurity (01/03/2023)   Hunger Vital Sign     Worried About Running Out of Food in the Last Year: Never true    Ran Out of Food in the Last Year: Never true  Transportation Needs: No Transportation Needs (01/03/2023)   PRAPARE - Administrator, Civil Service (Medical): No    Lack of Transportation (Non-Medical): No  Recent Concern: Transportation Needs - Unmet Transportation Needs (10/09/2022)   PRAPARE - Administrator, Civil Service (Medical): Yes    Lack of Transportation (Non-Medical): Yes  Physical Activity: Not on file  Stress: Not on file  Social Connections: Not on file  Intimate Partner Violence: Not At Risk (01/03/2023)   Humiliation, Afraid, Rape, and Kick questionnaire  Fear of Current or Ex-Partner: No    Emotionally Abused: No    Physically Abused: No    Sexually Abused: No    FAMILY HISTORY: Family History  Problem Relation Age of Onset   Uterine cancer Mother 35   Hypertension Father    Diabetes Father    Stroke Father    Kidney failure Father    Cancer Maternal Grandmother        unk type   Pancreatic cancer Cousin    Pancreatic cancer Cousin    Breast cancer Neg Hx     ALLERGIES:  has No Known Allergies.  MEDICATIONS:  Current Outpatient Medications  Medication Sig Dispense Refill   acetaminophen (TYLENOL) 500 MG tablet Take 1,000 mg by mouth every 6 (six) hours as needed.     amLODipine (NORVASC) 10 MG tablet Take 10 mg by mouth daily.     atorvastatin (LIPITOR) 80 MG tablet Take 80 mg by mouth at bedtime.     AURYXIA 1 GM 210 MG(Fe) tablet Take 630 mg by mouth 3 (three) times daily.     cinacalcet (SENSIPAR) 30 MG tablet Take 30 mg by mouth daily.     famotidine (PEPCID) 20 MG tablet Take 1 tablet (20 mg total) by mouth daily. 30 tablet 3   hydrALAZINE (APRESOLINE) 50 MG tablet Take 50 mg by mouth 3 (three) times daily.     HYDROcodone-acetaminophen (NORCO/VICODIN) 5-325 MG tablet Take 1 tablet by mouth every 6 (six) hours as needed for moderate pain. 15 tablet 0    levothyroxine (SYNTHROID, LEVOTHROID) 75 MCG tablet Take 75 mcg by mouth daily before breakfast.     losartan (COZAAR) 100 MG tablet Take 100 mg by mouth daily.     Methoxy PEG-Epoetin Beta (MIRCERA IJ) Mircera     No current facility-administered medications for this visit.    Review of Systems  Constitutional:  Negative for appetite change, chills, fatigue and fever.  HENT:   Negative for hearing loss and voice change.   Eyes:  Negative for eye problems.  Respiratory:  Negative for chest tightness and cough.   Cardiovascular:  Negative for chest pain.  Gastrointestinal:  Negative for abdominal distention, abdominal pain and blood in stool.  Endocrine: Negative for hot flashes.  Genitourinary:  Negative for difficulty urinating and frequency.   Musculoskeletal:  Negative for arthralgias.  Skin:  Negative for itching and rash.  Neurological:  Negative for extremity weakness.  Hematological:  Negative for adenopathy.  Psychiatric/Behavioral:  Negative for confusion.      PHYSICAL EXAMINATION: ECOG PERFORMANCE STATUS: 0 - Asymptomatic  Vitals:   02/07/23 0942  BP: (!) 162/76  Pulse: 77  Resp: 18  Temp: 98.3 F (36.8 C)   Filed Weights   02/07/23 0942  Weight: 138 lb 6.4 oz (62.8 kg)    Physical Exam Constitutional:      General: She is not in acute distress.    Appearance: She is not diaphoretic.  HENT:     Head: Normocephalic and atraumatic.  Eyes:     General: No scleral icterus. Cardiovascular:     Rate and Rhythm: Normal rate and regular rhythm.     Heart sounds: No murmur heard. Pulmonary:     Effort: Pulmonary effort is normal. No respiratory distress.  Chest:     Chest wall: No tenderness.  Abdominal:     General: There is no distension.  Musculoskeletal:        General: Normal range of motion.  Cervical back: Normal range of motion and neck supple.  Skin:    Findings: No erythema.  Neurological:     Mental Status: She is alert and oriented to  person, place, and time.     Cranial Nerves: No cranial nerve deficit.     Motor: No abnormal muscle tone.     Coordination: Coordination normal.  Psychiatric:        Mood and Affect: Mood and affect normal.     LABORATORY DATA:  I have reviewed the data as listed    Latest Ref Rng & Units 01/18/2023    9:54 AM 01/03/2023   11:56 AM 10/10/2022    5:09 AM  CBC  WBC 4.0 - 10.5 K/uL  5.3  4.5   Hemoglobin 12.0 - 15.0 g/dL 78.4  69.6  9.7   Hematocrit 36.0 - 46.0 % 36.0  32.8  29.8   Platelets 150 - 400 K/uL  141  104       Latest Ref Rng & Units 01/18/2023    9:54 AM 01/03/2023   11:56 AM 10/10/2022    5:09 AM  CMP  Glucose 70 - 99 mg/dL 83  75  295   BUN 8 - 23 mg/dL 35  49  48   Creatinine 0.44 - 1.00 mg/dL 2.84  1.32  4.40   Sodium 135 - 145 mmol/L 139  138  137   Potassium 3.5 - 5.1 mmol/L 3.2  3.9  4.0   Chloride 98 - 111 mmol/L 99  95  96   CO2 22 - 32 mmol/L  29  27   Calcium 8.9 - 10.3 mg/dL  8.6  8.2   Total Protein 6.5 - 8.1 g/dL  7.8  6.7   Total Bilirubin 0.3 - 1.2 mg/dL  0.9  1.0   Alkaline Phos 38 - 126 U/L  213  164   AST 15 - 41 U/L  25  35   ALT 0 - 44 U/L  16  18      RADIOGRAPHIC STUDIES: I have personally reviewed the radiological images as listed and agreed with the findings in the report. MM Breast Surgical Specimen  Result Date: 01/18/2023 CLINICAL DATA:  Patient is status post RIGHT breast lumpectomy for a biopsy-proven invasive mammary carcinoma (RIBBON). No preoperative localization was performed. EXAM: SPECIMEN RADIOGRAPH OF THE RIGHT BREAST COMPARISON:  Previous exam(s). FINDINGS: Status post excision of the RIGHT breast. The RIBBON shaped clip is present within the specimen. IMPRESSION: Specimen radiograph of the RIGHT breast. Electronically Signed   By: Meda Klinefelter M.D.   On: 01/18/2023 16:50   NM Sentinel Node Inj-No Rpt (Breast)  Result Date: 01/18/2023 Sulfur Colloid was injected by the Nuclear Medicine Technologist for sentinel lymph  node localization.

## 2023-02-07 NOTE — Assessment & Plan Note (Addendum)
Pathology was discussed with the patient, pT1c pN0  ER+ PR+ HER2 negative Oncotype Dx recurrence score 3, no benefit of chemotherapy.  Recommend Adjuvant radiation - she has appt with Radonc Recommend Adjuvant antiestrogen therapy, she is post menopausal, consider AI.  Obtain DEXA

## 2023-02-07 NOTE — Assessment & Plan Note (Signed)
Curable intent. Discussed with patient.

## 2023-02-07 NOTE — Assessment & Plan Note (Signed)
Hemoglobin stable 

## 2023-02-07 NOTE — Consult Note (Signed)
NEW PATIENT EVALUATION  Name: Felicia Butler  MRN: [id number]  Date:   02/07/2023     DOB: [birth year]   This 62 y.o. female patient presents to the clinic for initial evaluation of stage Ia (pT1c pN0 M0) invasive mammary carcinoma of the right breast status post wide local excision ER/PR positive HER2/neu not overexpressed.  REFERRING PHYSICIAN: Resa Miner, MD  CHIEF COMPLAINT:  Chief Complaint  Patient presents with   Breast Cancer    Consult    DIAGNOSIS: The encounter diagnosis was Malignant neoplasm of upper-outer quadrant of right female breast, unspecified estrogen receptor status (HCC).   PREVIOUS INVESTIGATIONS:  Pathology reports reviewed Mammograms ultrasound reviewed Clinical notes reviewed  HPI: Patient is a 62 year old female who presented with a self palpated mass in the right breast.  Ultrasound and mammography showed a lesion at the 6 10 o'clock position 7 cm from the nipple measuring 10 x 9 x 9 mm.  Axillary ultrasound showed no evidence of abnormality.  She underwent targeted ultrasound biopsy which was positive for invasive mammary carcinoma.  She went on to have a wide local excision for a 1.3 cm invasive mammary carcinoma tubular type overall grade 1.  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  Margins were clear at 2 mm 1 mm for DCIS.  3 sentinel lymph nodes were examined all negative for metastatic disease.  Tumor was ER/PR positive strongly HER2/neu not overexpressed.  Her postoperative course has been complicated by a large seroma of the right breast.  This is somewhat uncomfortable to the patient.  Surgeon has expressed desire if it does not resolve to drain it in the next few weeks.  Her Oncotype DX is pending.  She otherwise is doing well seen today for radiation oncology opinion.  PLANNED TREATMENT REGIMEN: Right whole breast radiation  PAST MEDICAL HISTORY:  has a past medical history of (HFpEF) heart failure with preserved ejection fraction (HCC)  (12/09/2021), Anemia associated with chronic renal failure, Aortic atherosclerosis (HCC), Arthritis, Atherosclerosis of iliac artery, Coronary artery disease, DDD (degenerative disc disease), lumbar, Dyspnea, ESRD (end stage renal disease) on dialysis (HCC), FSGS (focal segmental glomerulosclerosis) (1987), GERD (gastroesophageal reflux disease), Gout, Heart murmur, Hyperlipidemia, Hypertension, Hypothyroidism, Invasive carcinoma of RIGHT breast (HCC) (12/27/2022), Prolonged QT interval (05/03/2022), Pulmonary HTN (HCC) (12/09/2021), TB lung, latent, and Umbilical hernia.    PAST SURGICAL HISTORY:  Past Surgical History:  Procedure Laterality Date   A/V FISTULAGRAM N/A 10/10/2022   Procedure: A/V Fistulagram;  Surgeon: Annice Needy, MD;  Location: [facility];  Service: Cardiovascular;  Laterality: N/A;   A/V FISTULAGRAM N/A 02/01/2022   Procedure: A/V Fistulagram;  Surgeon: Renford Dills, MD;  Location: [facility];  Service: Cardiovascular;  Laterality: N/A;   A/V FISTULAGRAM Left 11/08/2022   Procedure: A/V Fistulagram;  Surgeon: Renford Dills, MD;  Location: [facility];  Service: Cardiovascular;  Laterality: Left;   ABDOMINAL HYSTERECTOMY     AV FISTULA PLACEMENT Left 01/18/2017   Procedure: ARTERIOVENOUS (AV) FISTULA CREATION ( BRACHIAL CEPHALIC );  Surgeon: Renford Dills, MD;  Location: [facility];  Service: Vascular;  Laterality: Left;   BREAST BIOPSY Right 04/30/2013   intraductal papilloma   BREAST BIOPSY Right 04/30/2013   cyst aspiration   BREAST BIOPSY Right 05/27/2013   subareolar duct excision   BREAST BIOPSY Left 05/27/2013   subareolar duct excision   BREAST BIOPSY Left 08/11/2016   benign   BREAST BIOPSY Right 12/27/2022   [place] bx/  ribbon clip/ path pending   BREAST BIOPSY Right 12/27/2022   [place] RT BREAST BX W LOC DEV 1ST LESION IMG BX SPEC US GUIDE 12/27/2022 [facility]   BREAST LUMPECTOMY WITH SENTINEL LYMPH NODE BIOPSY Right  01/18/2023   Procedure: BREAST LUMPECTOMY WITH SENTINEL LYMPH NODE BX;  Surgeon: Campbell Lerner, MD;  Location: [facility];  Service: General;  Laterality: Right;   COLONOSCOPY  06/28/2018   DIALYSIS/PERMA CATHETER INSERTION N/A 02/01/2022   Procedure: DIALYSIS/PERMA CATHETER INSERTION;  Surgeon: Renford Dills, MD;  Location: [facility];  Service: Cardiovascular;  Laterality: N/A;   DIALYSIS/PERMA CATHETER REMOVAL N/A 07/28/2022   Procedure: DIALYSIS/PERMA CATHETER REMOVAL;  Surgeon: Annice Needy, MD;  Location: [facility];  Service: Cardiovascular;  Laterality: N/A;   HERNIA REPAIR  1960's   PARTIAL HYSTERECTOMY     1980's   PERIPHERAL VASCULAR THROMBECTOMY Left 04/18/2017   Procedure: PERIPHERAL VASCULAR THROMBECTOMY;  Surgeon: Renford Dills, MD;  Location: [facility];  Service: Cardiovascular;  Laterality: Left;   REVISION OF ARTERIOVENOUS GORETEX GRAFT Left 10/09/2022   Procedure: REVISION OF ARTERIOVENOUS FISTULA;  Surgeon: Nada Libman, MD;  Location: [facility];  Service: Vascular;  Laterality: Left;   REVISON OF ARTERIOVENOUS FISTULA Left 05/06/2022   Procedure: REVISON OF ARTERIOVENOUS FISTULA (BRACHIAL CEPHALIC);  Surgeon: Renford Dills, MD;  Location: [facility];  Service: Vascular;  Laterality: Left;    FAMILY HISTORY: family history includes Cancer in her maternal grandmother; Diabetes in her father; Hypertension in her father; Kidney failure in her father; Pancreatic cancer in her cousin and cousin; Stroke in her father; Uterine cancer (age of onset: 78) in her mother.  SOCIAL HISTORY:  reports that she quit smoking about 39 years ago. Her smoking use included cigarettes. She started smoking about 44 years ago. She has been exposed to tobacco smoke. She has never used smokeless tobacco. She reports that she does not drink alcohol and does not use drugs.  ALLERGIES: Patient has no known allergies.  MEDICATIONS:  Current Outpatient  Medications  Medication Sig Dispense Refill   acetaminophen (TYLENOL) 500 MG tablet Take 1,000 mg by mouth every 6 (six) hours as needed.     amLODipine (NORVASC) 10 MG tablet Take 10 mg by mouth daily.     atorvastatin (LIPITOR) 80 MG tablet Take 80 mg by mouth at bedtime.     AURYXIA 1 GM 210 MG(Fe) tablet Take 630 mg by mouth 3 (three) times daily.     cinacalcet (SENSIPAR) 30 MG tablet Take 30 mg by mouth daily.     famotidine (PEPCID) 20 MG tablet Take 1 tablet (20 mg total) by mouth daily. 30 tablet 3   hydrALAZINE (APRESOLINE) 50 MG tablet Take 50 mg by mouth 3 (three) times daily.     HYDROcodone-acetaminophen (NORCO/VICODIN) 5-325 MG tablet Take 1 tablet by mouth every 6 (six) hours as needed for moderate pain. 15 tablet 0   levothyroxine (SYNTHROID, LEVOTHROID) 75 MCG tablet Take 75 mcg by mouth daily before breakfast.     losartan (COZAAR) 100 MG tablet Take 100 mg by mouth daily.     Methoxy PEG-Epoetin Beta (MIRCERA IJ) Mircera     No current facility-administered medications for this encounter.    ECOG PERFORMANCE STATUS:  1 - Symptomatic but completely ambulatory  REVIEW OF SYSTEMS: Patient denies any weight loss, fatigue, weakness, fever, chills or night sweats. Patient denies any loss of vision, blurred vision. Patient denies any ringing  of  the ears or hearing loss. No irregular heartbeat. Patient denies heart murmur or history of fainting. Patient denies any chest pain or pain radiating to her upper extremities. Patient denies any shortness of breath, difficulty breathing at night, cough or hemoptysis. Patient denies any swelling in the lower legs. Patient denies any nausea vomiting, vomiting of blood, or coffee ground material in the vomitus. Patient denies any stomach pain. Patient states has had normal bowel movements no significant constipation or diarrhea. Patient denies any dysuria, hematuria or significant nocturia. Patient denies any problems walking, swelling in the  joints or loss of balance. Patient denies any skin changes, loss of hair or loss of weight. Patient denies any excessive worrying or anxiety or significant depression. Patient denies any problems with insomnia. Patient denies excessive thirst, polyuria, polydipsia. Patient denies any swollen glands, patient denies easy bruising or easy bleeding. Patient denies any recent infections, allergies or URI. Patient "s visual fields have not changed significantly in recent time.   PHYSICAL EXAM: LMP 11/18/1996 (Approximate)  Patient status post wide local excision of the right breast there is a large seroma in the right portion of the breast.  No other dominant masses noted in either breast.  No axillary or supraclavicular adenopathy is appreciated.  Well-developed well-nourished patient in NAD. HEENT reveals PERLA, EOMI, discs not visualized.  Oral cavity is clear. No oral mucosal lesions are identified. Neck is clear without evidence of cervical or supraclavicular adenopathy. Lungs are clear to A&P. Cardiac examination is essentially unremarkable with regular rate and rhythm without murmur rub or thrill. Abdomen is benign with no organomegaly or masses noted. Motor sensory and DTR levels are equal and symmetric in the upper and lower extremities. Cranial nerves II through XII are grossly intact. Proprioception is intact. No peripheral adenopathy or edema is identified. No motor or sensory levels are noted. Crude visual fields are within normal range.  LABORATORY DATA: Pathology reports reviewed    RADIOLOGY RESULTS: Mammogram and ultrasound reviewed compatible with above-stated findings   IMPRESSION: Stage Ia ER/PR positive invasive mammary carcinoma the right breast status post wide local excision in 62 year old female  PLAN: At this time await for surgeon to return from vacation.  Patient is seeing him in the next couple of weeks I would asked that he try to drain the seroma since it is so large and  uncomfortable and will affect our treatment planning.  I am also awaiting Oncotype DX results although I believe that we will show no strong risk for recurrence.  I have set the patient up in about 2 weeks for follow-up at that time both Oncotype DX and drainage may have occurred.  Patient comprehends my recommendations well.  I would like to take this opportunity to thank you for allowing me to participate in the care of your patient.Carmina Miller, MD

## 2023-02-09 ENCOUNTER — Encounter: Payer: Self-pay | Admitting: Gastroenterology

## 2023-02-15 ENCOUNTER — Inpatient Hospital Stay (HOSPITAL_BASED_OUTPATIENT_CLINIC_OR_DEPARTMENT_OTHER): Payer: 59 | Admitting: Hospice and Palliative Medicine

## 2023-02-15 DIAGNOSIS — C50919 Malignant neoplasm of unspecified site of unspecified female breast: Secondary | ICD-10-CM

## 2023-02-15 NOTE — Progress Notes (Signed)
Multidisciplinary Oncology Council Documentation  Felicia Butler was presented by our [facility] on 02/15/2023, which included representatives from:  Palliative Care Dietitian  Physical/Occupational Therapist Nurse Navigator Genetics Social work Survivorship RN Financial Navigator Research RN   Felicia Butler currently presents with history of breast cancer  We reviewed previous medical and familial history, history of present illness, and recent lab results along with all available histopathologic and imaging studies. The MOC considered available treatment options and made the following recommendations/referrals:  Rehab screening, SW  The MOC is a meeting of clinicians from various specialty areas who evaluate and discuss patients for whom a multidisciplinary approach is being considered. Final determinations in the plan of care are those of the provider(s).   Today's extended care, comprehensive team conference, Felicia Butler was not present for the discussion and was not examined.

## 2023-02-16 ENCOUNTER — Other Ambulatory Visit: Payer: Self-pay | Admitting: *Deleted

## 2023-02-16 ENCOUNTER — Encounter: Payer: 59 | Admitting: Surgery

## 2023-02-16 ENCOUNTER — Ambulatory Visit: Admission: RE | Admit: 2023-02-16 | Payer: 59 | Source: Home / Self Care | Admitting: Gastroenterology

## 2023-02-16 ENCOUNTER — Encounter: Admission: RE | Payer: Self-pay | Source: Home / Self Care

## 2023-02-16 DIAGNOSIS — C50919 Malignant neoplasm of unspecified site of unspecified female breast: Secondary | ICD-10-CM

## 2023-02-16 SURGERY — COLONOSCOPY WITH PROPOFOL
Anesthesia: General

## 2023-02-21 ENCOUNTER — Ambulatory Visit: Payer: 59 | Admitting: Radiation Oncology

## 2023-02-22 ENCOUNTER — Inpatient Hospital Stay: Payer: 59 | Admitting: Occupational Therapy

## 2023-02-22 DIAGNOSIS — N6489 Other specified disorders of breast: Secondary | ICD-10-CM

## 2023-02-22 NOTE — Progress Notes (Unsigned)
[facility] POST-OP OFFICE VISIT  02/22/2023  HPI: Felicia Butler is a 62 y.o. female 8 days s/p right upper outer quadrant breast lumpectomy, and sentinel lymph node biopsy. She presents today with no complaints outside of some swelling and soreness.  She has had no drainage from incisions, denies any fevers or chills.  Diagnosis 1. 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 - SEE CANCER SUMMARY BELOW. - BIOPSY SITE CHANGE WITH CLIP. - INTRADUCTAL PAPILLOMA WITH FLORID DUCTAL HYPERPLASIA, COLUMNAR CELL CHANGE, CYST FORMATION, AND ATYPICAL LOBULAR HYPERPLASIA. 2. Lymph node, sentinel, biopsy, #1 Right - ONE LYMPH NODE NEGATIVE FOR MALIGNANCY (0/1). 3. Lymph node, sentinel, biopsy, #2 Right - ONE LYMPH NODE NEGATIVE FOR MALIGNANCY (0/1). 4. Lymph node, sentinel, biopsy, #3 Right - ONE LYMPH NODE NEGATIVE FOR MALIGNANCY (0/1). Microscopic Comment 1. CANCER CASE SUMMARY: INVASIVE CARCINOMA OF THE BREAST Standard(s): AJCC-UICC 8 SPECIMEN Procedure: Lumpectomy Specimen Laterality: Right TUMOR Histologic Type: Tubular carcinoma Histologic Grade (Nottingham Histologic Score) Glandular (Acinar)/Tubular Differentiation: 1 Nuclear Pleomorphism: 2 Mitotic Rate: 1 Overall Grade: 1 Tumor Size: Greatest dimension of largest invasive focus: 13 mm Ductal Carcinoma In Situ (DCIS): Present, intermediate grade  Tumor Extent: Not applicable Lymphatic and/or Vascular Invasion: Not identified Treatment Effect in the Breast: No known presurgical therapy MARGINS Margin Status for Invasive Carcinoma: All margins negative for invasive carcinoma Distance from closest margin: 2 mm Specify closest margin: Superior Margin Status for DCIS: All margins negative for DCIS Distance from DCIS to closest margin: 1 mm Specify closest margin: Superior REGIONAL LYMPH NODES Regional Lymph Node Status: All regional lymph nodes negative  for tumor Total Number of Lymph Nodes Examined (sentinel and non-sentinel): 3 Number of Sentinel Nodes Examined: 3 DISTANT METASTASIS Distant Site(s) Involved, if applicable: Not applicable PATHOLOGIC STAGE CLASSIFICATION (pTNM, AJCC 8th Edition): Modified Classification: Not applicable pT Category: pT1c T Suffix: Not applicable pN Category: pN0 N Suffix: (sn) pM Category: Not applicable SPECIAL STUDIES Breast Biomarker Testing Performed on Previous Biopsy: [id number] Estrogen Receptor: Positive (100%, strong) Progesterone Receptor: Positive (100%, strong) HER2 IHC: Negative (1+)   Vital signs: LMP 11/18/1996 (Approximate)    Physical Exam: Constitutional: She appears well.   Skin/Breast: Her lumpectomy site is quite prominent with a good sized seroma probably at least 7 to 8 cm in diameter.  This is no great surprise considering the the lax dermal envelope about her ptotic right breast.  The incision to the axilla and upper outer quadrant of the right breast both appear clean dry and intact.  There is some peau d'orange-like changes of the dermal skin but I believe this is due to the dependency of the breast, and the interruption of the draining lymphatics of the upper outer quadrant.  I do not believe this is evidence of inflammatory cancer.  She really does not complain much of this at all.  Assessment/Plan: This is a 62 y.o. female 8 days s/p lumpectomy of the upper outer quadrant right breast and sentinel lymph node biopsy right axilla.  Patient Active Problem List   Diagnosis Date Noted   Goals of care, counseling/discussion 02/07/2023   Seroma of breast 01/26/2023   Genetic testing 01/23/2023   Invasive carcinoma of breast (HCC) 01/03/2023   Family history of cancer 01/03/2023   Complication of vascular graft 10/09/2022   Hypercalcemia 06/03/2022   Chronic kidney disease (CKD), stage III (moderate) (HCC) 06/03/2022   Hypertension 06/03/2022   Allergy, unspecified,  initial encounter 01/30/2022   Anaphylactic  shock, unspecified, initial encounter 01/30/2022   Mixed hyperlipidemia 01/06/2021   Contact with and (suspected) exposure to covid-19 11/06/2020   Encounter for screening for COVID-19 11/06/2020   CAD in native artery 09/10/2020   Complication of vascular access for dialysis 08/19/2020   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   Gout 02/27/2020   Hyperlipidemia 02/27/2020   Hypothyroidism 02/27/2020   Obesity 02/27/2020   TB lung, 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   Other disorders of phosphorus metabolism 11/07/2018   COVID-19 10/15/2018   Hyperkalemia 08/08/2018   Anemia in chronic kidney disease 04/10/2017   Coagulation defect, unspecified (HCC) 04/10/2017   Diarrhea, unspecified 04/10/2017   Encounter for immunization 04/10/2017   End stage renal disease (HCC) 04/10/2017   Fever, unspecified 04/10/2017   Headache, unspecified 04/10/2017   Iron deficiency anemia, unspecified 04/10/2017   Moderate protein-calorie malnutrition (HCC) 04/10/2017   Pain, unspecified 04/10/2017   Pruritus, unspecified 04/10/2017   Secondary hyperparathyroidism of renal origin (HCC) 04/10/2017   Shortness of breath 04/10/2017   Type 2 diabetes mellitus with diabetic chronic kidney disease (HCC) 04/10/2017   Hypertensive chronic kidney disease with stage 1 through stage 4 chronic kidney disease, or unspecified chronic kidney disease 04/10/2017   Chronic renal insufficiency 12/18/2016   Essential hypertension 12/18/2016   Intraductal papilloma of breast 05/16/2013   Proteinuria 10/07/2012    -This hematoma/seroma of the upper outer quadrant of the right breast will likely be more amenable to percutaneous drainage with time.  At present she is reluctant to consider anything more aggressive.  She will have follow-up with radiation oncology doctor, Dr. Rushie Chestnut and Dr. Cathie Hoops during my absence in the second week of September. I will anticipate seeing her later in the  month.   Campbell Lerner M.D., FACS 02/22/2023, 1:22 PM

## 2023-02-22 NOTE — Therapy (Signed)
[facility] at [facility] [address] [place], [place], [address] Phone: [phone]   Fax:  [phone]  Occupational Therapy Screen  Patient Details  Name: Felicia Butler MRN: [id number] Date of Birth: [birth year] No data recorded  Encounter Date: 02/22/2023   OT End of Session - 02/22/23 1720     Visit Number 0             Past Medical History:  Diagnosis Date   (HFpEF) heart failure with preserved ejection fraction (HCC) 12/09/2021   a.) TTE 12/09/2021: EF >55%, no RWMAs, GLS -16.8%, mild LAE, mod pHTN (RVSP 52), G2DD   Anemia associated with chronic renal failure    Aortic atherosclerosis (HCC)    Arthritis    Atherosclerosis of iliac artery    Coronary artery disease    a.) MV 11/28/2019: no sig CAD, no ischemia; b.) MV 09/21/2021: no sig CAD, no ischemia   DDD (degenerative disc disease), lumbar    Dyspnea    ESRD (end stage renal disease) on dialysis [facility])    a.) started HD on M-W-F in 2018   FSGS (focal segmental glomerulosclerosis) 1987   a.) diagnosed by Bx in 1987   GERD (gastroesophageal reflux disease)    Gout    Heart murmur    Hyperlipidemia    Hypertension    Hypothyroidism    Invasive carcinoma of RIGHT breast (HCC) 12/27/2022   a.) CNB of RIGHT breast mass on 12/27/2022 --> invasive mammary carcinoma (cT1b, cN0, cM0); ER/PR (+), HER2/neu (-).   Prolonged QT interval 05/03/2022   a.) ECG 05/03/2022: QTc = 511 ms; b.) ECG 01/13/2023: QTc = 577 ms   Pulmonary HTN (HCC) 12/09/2021   a.) TTE 12/09/2021: RVSP 52 mmHg   TB lung, latent    a.) (+) PPD 09/2011. CXR (-). Declined INH due to potential for hepatotoxicity.   Umbilical hernia     Past Surgical History:  Procedure Laterality Date   A/V FISTULAGRAM N/A 10/10/2022   Procedure: A/V Fistulagram;  Surgeon: Annice Needy, MD;  Location: [facility] INVASIVE CV LAB;  Service: Cardiovascular;  Laterality: N/A;   A/V FISTULAGRAM N/A 02/01/2022   Procedure:  A/V Fistulagram;  Surgeon: Renford Dills, MD;  Location: [facility] INVASIVE CV LAB;  Service: Cardiovascular;  Laterality: N/A;   A/V FISTULAGRAM Left 11/08/2022   Procedure: A/V Fistulagram;  Surgeon: Renford Dills, MD;  Location: [facility] INVASIVE CV LAB;  Service: Cardiovascular;  Laterality: Left;   ABDOMINAL HYSTERECTOMY     AV FISTULA PLACEMENT Left 01/18/2017   Procedure: ARTERIOVENOUS (AV) FISTULA CREATION ( BRACHIAL CEPHALIC );  Surgeon: Renford Dills, MD;  Location: [facility] ORS;  Service: Vascular;  Laterality: Left;   BREAST BIOPSY Right 04/30/2013   intraductal papilloma   BREAST BIOPSY Right 04/30/2013   cyst aspiration   BREAST BIOPSY Right 05/27/2013   subareolar duct excision   BREAST BIOPSY Left 05/27/2013   subareolar duct excision   BREAST BIOPSY Left 08/11/2016   benign   BREAST BIOPSY Right 12/27/2022   [place] bx/ ribbon clip/ path pending   BREAST BIOPSY Right 12/27/2022   [place] RT BREAST BX W LOC DEV 1ST LESION IMG BX SPEC US GUIDE 12/27/2022 [facility]   BREAST LUMPECTOMY WITH SENTINEL LYMPH NODE BIOPSY Right 01/18/2023   Procedure: BREAST LUMPECTOMY WITH SENTINEL LYMPH NODE BX;  Surgeon: Campbell Lerner, MD;  Location: [facility] ORS;  Service: General;  Laterality: Right;   COLONOSCOPY  06/28/2018   DIALYSIS/PERMA  CATHETER INSERTION N/A 02/01/2022   Procedure: DIALYSIS/PERMA CATHETER INSERTION;  Surgeon: Renford Dills, MD;  Location: [facility] INVASIVE CV LAB;  Service: Cardiovascular;  Laterality: N/A;   DIALYSIS/PERMA CATHETER REMOVAL N/A 07/28/2022   Procedure: DIALYSIS/PERMA CATHETER REMOVAL;  Surgeon: Annice Needy, MD;  Location: [facility] INVASIVE CV LAB;  Service: Cardiovascular;  Laterality: N/A;   HERNIA REPAIR  1960's   PARTIAL HYSTERECTOMY     1980's   PERIPHERAL VASCULAR THROMBECTOMY Left 04/18/2017   Procedure: PERIPHERAL VASCULAR THROMBECTOMY;  Surgeon: Renford Dills, MD;  Location: [facility] INVAS[facility] CV LAB;  Service: Cardiovascular;  Laterality: Left;    REVISION OF ARTERIOVENOUS GORETEX GRAFT Left 10/09/2022   Procedure: REVISION OF ARTERIOVENOUS FISTULA;  Surgeon: Nada Libman, MD;  Location: [facility] ORS;  Service: Vascular;  Laterality: Left;   REVISON OF ARTERIOVENOUS FISTULA Left 05/06/2022   Procedure: REVISON OF ARTERIOVENOUS FISTULA (BRACHIAL CEPHALIC);  Surgeon: Renford Dills, MD;  Location: [facility] ORS;  Service: Vascular;  Laterality: Left;    There were no vitals filed for this visit.   Subjective Assessment - 02/22/23 1720     Subjective  I am doing okay -seeing surgeon tomorrow to drain the fluid off my breast- before I can start radiation in about 3 wks -I have dialysis 3 x wk    Currently in Pain? No/denies                 LYMPHEDEMA/ONCOLOGY QUESTIONNAIRE - 02/22/23 0001       Right Upper Extremity Lymphedema   15 cm Proximal to Olecranon Process 26.5 cm    Olecranon Process 21.4 cm    15 cm Proximal to Ulnar Styloid Process 21.4 cm    Just Proximal to Ulnar Styloid Process 15.4 cm      Left Upper Extremity Lymphedema   10 cm Proximal to Olecranon Process 27 cm    Olecranon Process 21.5 cm    15 cm Proximal to Ulnar Styloid Process 20.8 cm    Just Proximal to Ulnar Styloid Process 14.5 cm              Felicia Miller, MD - 02/07/23 PLAN: At this time await for surgeon to return from vacation.  Patient is seeing him in the next couple of weeks I would asked that he try to drain the seroma since it is so large and uncomfortable and will affect our treatment planning.  I am also awaiting Oncotype DX results although I believe that we will show no strong risk for recurrence.  I have set the patient up in about 2 weeks for follow-up at that time both Oncotype DX and drainage may have occurred.  Patient comprehends my recommendations well.   OT SCREEN 02/22/23:  Patient had right lumpectomy on 01/18/2023 by Dr. Claudine Mouton. Bilateral upper extremity range of motion within normal limits and strength within  functional limits. Circumference of bilateral upper extremity taken and within normal limits. Patient lives with sister and son lives with them.  Patient is right-hand dominant.  Patient on disability. Patient has a fistula on the left-getting dialysis 3 times a week. Patient has a follow-up with surgeon tomorrow to drain seroma. Possible start for radiation after seroma is drained in 2 to 3 weeks. Right breast heavier and fuller but patient denies pain. 31 cm from posterior to sternum. Patient to follow-up with me again in 2 weeks.  Visit Diagnosis: Seroma of breast    Problem List Patient Active Problem List   Diagnosis Date Noted   Goals of care, counseling/discussion 02/07/2023   Seroma of breast 01/26/2023   Genetic testing 01/23/2023   Invasive carcinoma of breast (HCC) 01/03/2023   Family history of cancer 01/03/2023   Complication of vascular graft 10/09/2022   Hypercalcemia 06/03/2022   Chronic kidney disease (CKD), stage III (moderate) (HCC) 06/03/2022   Hypertension 06/03/2022   Allergy, unspecified, initial encounter 01/30/2022   Anaphylactic shock, unspecified, initial encounter 01/30/2022   Mixed hyperlipidemia 01/06/2021   Contact with and (suspected) exposure to covid-19 11/06/2020   Encounter for screening for COVID-19 11/06/2020   CAD in native artery 09/10/2020   Complication of vascular access for dialysis 08/19/2020   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   Gout 02/27/2020   Hyperlipidemia 02/27/2020   Hypothyroidism 02/27/2020   Obesity 02/27/2020   TB lung, 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   Other disorders of phosphorus metabolism 11/07/2018   COVID-19 10/15/2018   Hyperkalemia 08/08/2018   Anemia in chronic kidney disease 04/10/2017   Coagulation defect, unspecified (HCC) 04/10/2017   Diarrhea, unspecified 04/10/2017   Encounter for immunization 04/10/2017   End  stage renal disease (HCC) 04/10/2017   Fever, unspecified 04/10/2017   Headache, unspecified 04/10/2017   Iron deficiency anemia, unspecified 04/10/2017   Moderate protein-calorie malnutrition (HCC) 04/10/2017   Pain, unspecified 04/10/2017   Pruritus, unspecified 04/10/2017   Secondary hyperparathyroidism of renal origin (HCC) 04/10/2017   Shortness of breath 04/10/2017   Type 2 diabetes mellitus with diabetic chronic kidney disease (HCC) 04/10/2017   Hypertensive chronic kidney disease with stage 1 through stage 4 chronic kidney disease, or unspecified chronic kidney disease 04/10/2017   Chronic renal insufficiency 12/18/2016   Essential hypertension 12/18/2016   Intraductal papilloma of breast 05/16/2013   Proteinuria 10/07/2012    Felicia Butler, OTR/L,CLT 02/22/2023, 5:23 PM  [facility] at [facility] [address], [address] [place], [place], [address] Phone: [phone]   Fax:  [phone]  Name: Felicia Butler MRN: [id number] Date of Birth: 1961/01/17

## 2023-02-23 ENCOUNTER — Ambulatory Visit: Payer: 59 | Admitting: Surgery

## 2023-02-23 ENCOUNTER — Encounter: Payer: Self-pay | Admitting: Surgery

## 2023-02-23 VITALS — BP 163/75 | HR 79 | Temp 98.5°F | Ht 61.0 in | Wt 135.8 lb

## 2023-02-23 DIAGNOSIS — N6489 Other specified disorders of breast: Secondary | ICD-10-CM

## 2023-02-23 DIAGNOSIS — L7634 Postprocedural seroma of skin and subcutaneous tissue following other procedure: Secondary | ICD-10-CM | POA: Diagnosis not present

## 2023-02-23 NOTE — Patient Instructions (Signed)
Breast Self-Awareness Breast self-awareness means being familiar with how your breasts look and feel. It involves checking your breasts regularly and telling your health care provider about any changes. Practicing breast self-awareness helps to maintain breast health. Sometimes, changes are not harmful (are benign). Other times, a change in your breasts can be a sign of a serious medical problem. Being familiar with the look and feel of your breasts can help you catch a breast problem while it is still small and can be treated. You should do breast self-exams even if you have breast implants. What you need: A mirror. A well-lit room. A pillow or other soft object. How to do a breast self-exam A breast self-exam is one way to learn what is normal for your breasts and whether your breasts are changing. To do a breast self-exam: Look for changes  Remove all the clothing above your waist. Stand in front of a mirror in a room with good lighting. Put your hands down at your sides. Compare your breasts in the mirror. Look for differences between them (asymmetry), such as: Differences in shape. Differences in size. Puckers, dips, and bumps in one breast and not the other. Look at each breast for changes in the skin, such as: Redness. Scaly areas. Skin thickening. Dimpling. Open sores (ulcers). Look for changes in your nipples, such as: Discharge. Bleeding. Dimpling. Redness. A nipple that looks pushed in (retracted), or that has changed position. Feel for changes Carefully feel your breasts for lumps and changes. It is best to do this self-exam while lying down. Follow these steps to feel each breast: Place a pillow under the shoulder of one side of your body. Place the arm of that side of your body behind your head. Feel the breast of that side of your body using the hand of the opposite arm. To do this: Start in the nipple area and use the pads of your three middle fingers to make -inch  (2 cm) overlapping circles. Use light, medium, and then firm pressure as you feel your breast, gently covering the entire breast area and armpit. Continue the overlapping circles, moving downward over the breast until you feel your ribs below your breast. Then, make circles with your fingers going upward until you reach your collarbone. Next, make circles by moving outward across your breast and into your armpit area. Squeeze the nipple. Check for discharge and lumps. Repeat steps 1-7 to check your other breast. Sit or stand in the tub or shower. With soapy water on your skin, feel each breast the same way you did when you were lying down. Write down what you find Writing down what you find can help you remember what to discuss with your health care provider. Write down: What is normal for each breast. Any changes that you find in each breast. These include: The kind of changes you find. Any pain or tenderness. Size and location of any lumps. Where you are in your menstrual cycle, if you are still getting your menstrual period (menstruating). General tips If you are breastfeeding, the best time to examine your breasts is after a feeding or after using a breast pump. If you menstruate, the best time to examine your breasts is 5-7 days after your menstrual period. Breasts are generally lumpier during menstrual periods, and it may be more difficult to notice changes. With time and practice, you will become more familiar with the differences in your breasts and more comfortable with the exam. Contact a health care  provider if: You see a change in the shape or size of your breasts or nipples. You see a change in the skin of your breast or nipples, such as a reddened or scaly area. You have unusual discharge from your nipples. You find a new lump or thick area. You have breast pain. You have any concerns about your breast health. Summary Breast self-awareness includes looking for physical  changes in your breasts and feeling for any changes within your breasts. Breast self-awareness should be done in front of a mirror in a well-lit room. If you menstruate, the best time to examine your breasts is 5-7 days after your menstrual period. Tell your health care provider about any changes you notice in your breasts. Changes include changes in size, changes on the skin, pain or tenderness, or unusual fluid from your nipples. This information is not intended to replace advice given to you by your health care provider. Make sure you discuss any questions you have with your health care provider. Document Revised: 10/21/2021 Document Reviewed: 03/18/2021 Elsevier Patient Education  2024 [employer].

## 2023-03-02 ENCOUNTER — Telehealth: Payer: Self-pay | Admitting: Surgery

## 2023-03-02 ENCOUNTER — Ambulatory Visit: Payer: Self-pay | Admitting: Surgery

## 2023-03-02 ENCOUNTER — Ambulatory Visit: Payer: 59 | Admitting: Surgery

## 2023-03-02 ENCOUNTER — Encounter: Payer: Self-pay | Admitting: *Deleted

## 2023-03-02 ENCOUNTER — Encounter: Payer: Self-pay | Admitting: Surgery

## 2023-03-02 VITALS — BP 130/80 | HR 80 | Temp 98.0°F | Ht 61.0 in | Wt 131.0 lb

## 2023-03-02 DIAGNOSIS — L7634 Postprocedural seroma of skin and subcutaneous tissue following other procedure: Secondary | ICD-10-CM

## 2023-03-02 DIAGNOSIS — Z09 Encounter for follow-up examination after completed treatment for conditions other than malignant neoplasm: Secondary | ICD-10-CM

## 2023-03-02 DIAGNOSIS — N6489 Other specified disorders of breast: Secondary | ICD-10-CM

## 2023-03-02 NOTE — H&P (View-Only) (Signed)
[facility] POST-OP OFFICE VISIT  03/02/2023  HPI: She returns today in follow-up of her right breast seroma, since her aspiration last week, she has had immediate recurrence of the fluid.  She still finds that wearing her bra is uncomfortable and would hope not to wear any longer. Felicia Butler is a 61 y.o. female  s/p right upper outer quadrant breast lumpectomy, and sentinel lymph node biopsy January 18, 2023. She presents today with no complaints outside of some swelling and soreness.  She reports although the symptoms have significantly improved, she still has a persistent seroma present at the upper outer quadrant of her right breast where her surgery was completed.  At present her radiation treatment is on hold until the seroma can be treated.  She has had no drainage from incisions, denies any fevers or chills.  Diagnosis 1. 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 - SEE CANCER SUMMARY BELOW. - BIOPSY SITE CHANGE WITH CLIP. - INTRADUCTAL PAPILLOMA WITH FLORID DUCTAL HYPERPLASIA, COLUMNAR CELL CHANGE, CYST FORMATION, AND ATYPICAL LOBULAR HYPERPLASIA. 2. Lymph node, sentinel, biopsy, #1 Right - ONE LYMPH NODE NEGATIVE FOR MALIGNANCY (0/1). 3. Lymph node, sentinel, biopsy, #2 Right - ONE LYMPH NODE NEGATIVE FOR MALIGNANCY (0/1). 4. Lymph node, sentinel, biopsy, #3 Right - ONE LYMPH NODE NEGATIVE FOR MALIGNANCY (0/1). Microscopic Comment 1. CANCER CASE SUMMARY: INVASIVE CARCINOMA OF THE BREAST Standard(s): AJCC-UICC 8 SPECIMEN Procedure: Lumpectomy Specimen Laterality: Right TUMOR Histologic Type: Tubular carcinoma Histologic Grade (Nottingham Histologic Score) Glandular (Acinar)/Tubular Differentiation: 1 Nuclear Pleomorphism: 2 Mitotic Rate: 1 Overall Grade: 1 Tumor Size: Greatest dimension of largest invasive focus: 13 mm Ductal Carcinoma In Situ (DCIS): Present, intermediate grade  Tumor  Extent: Not applicable Lymphatic and/or Vascular Invasion: Not identified Treatment Effect in the Breast: No known presurgical therapy MARGINS Margin Status for Invasive Carcinoma: All margins negative for invasive carcinoma Distance from closest margin: 2 mm Specify closest margin: Superior Margin Status for DCIS: All margins negative for DCIS Distance from DCIS to closest margin: 1 mm Specify closest margin: Superior REGIONAL LYMPH NODES Regional Lymph Node Status: All regional lymph nodes negative for tumor Total Number of Lymph Nodes Examined (sentinel and non-sentinel): 3 Number of Sentinel Nodes Examined: 3 DISTANT METASTASIS Distant Site(s) Involved, if applicable: Not applicable PATHOLOGIC STAGE CLASSIFICATION (pTNM, AJCC 8th Edition): Modified Classification: Not applicable pT Category: pT1c T Suffix: Not applicable pN Category: pN0 N Suffix: (sn) pM Category: Not applicable SPECIAL STUDIES Breast Biomarker Testing Performed on Previous Biopsy: [id number] Estrogen Receptor: Positive (100%, strong) Progesterone Receptor: Positive (100%, strong) HER2 IHC: Negative (1+)   Vital signs: BP 130/80   Pulse 80   Temp 98 F (36.7 C)   Ht 5\' 1"  (1.549 m)   Wt 131 lb (59.4 kg)   LMP 11/18/1996 (Approximate)   SpO2 97%   BMI 24.75 kg/m    Physical Exam: Constitutional: She appears well.  Chest clear to auscultation. Heart regular rate and rhythm. Skin/Breast: Her right breast lumpectomy site is quite prominent with a good sized seroma probably at least 7 to 8 cm in diameter.  This is no great surprise considering the the lax dermal envelope about her ptotic right breast.  The incision to the axilla and upper outer quadrant of the right breast both appear clean dry and intact.  There is some peau d'orange-like changes of the dermal skin but I believe this is due to the dependency of the breast, and the interruption of  the draining lymphatics of the upper outer quadrant.  I  do not believe this is evidence of inflammatory cancer.  She really does not complain much of this at all.  Assessment/Plan: This is a 61 y.o. female  s/p lumpectomy of the upper outer quadrant right breast and sentinel lymph node biopsy right axilla, with multiloculated postoperative right upper breast seroma/hematoma.  Patient Active Problem List   Diagnosis Date Noted   Goals of care, counseling/discussion 02/07/2023   Seroma of breast 01/26/2023   Genetic testing 01/23/2023   Invasive carcinoma of breast (HCC) 01/03/2023   Family history of cancer 01/03/2023   Complication of vascular graft 10/09/2022   Hypercalcemia 06/03/2022   Chronic kidney disease (CKD), stage III (moderate) (HCC) 06/03/2022   Hypertension 06/03/2022   Allergy, unspecified, initial encounter 01/30/2022   Anaphylactic shock, unspecified, initial encounter 01/30/2022   Mixed hyperlipidemia 01/06/2021   Contact with and (suspected) exposure to covid-19 11/06/2020   Encounter for screening for COVID-19 11/06/2020   CAD in native artery 09/10/2020   Complication of vascular access for dialysis 08/19/2020   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   Gout 02/27/2020   Hyperlipidemia 02/27/2020   Hypothyroidism 02/27/2020   Obesity 02/27/2020   TB lung, 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   Other disorders of phosphorus metabolism 11/07/2018   COVID-19 10/15/2018   Hyperkalemia 08/08/2018   Anemia in chronic kidney disease 04/10/2017   Coagulation defect, unspecified (HCC) 04/10/2017   Diarrhea, unspecified 04/10/2017   Encounter for immunization 04/10/2017   End stage renal disease (HCC) 04/10/2017   Fever, unspecified 04/10/2017   Headache, unspecified 04/10/2017   Iron deficiency anemia, unspecified 04/10/2017   Moderate protein-calorie malnutrition (HCC) 04/10/2017   Pain, unspecified 04/10/2017   Pruritus, unspecified 04/10/2017   Secondary hyperparathyroidism of renal origin (HCC) 04/10/2017   Shortness  of breath 04/10/2017   Type 2 diabetes mellitus with diabetic chronic kidney disease (HCC) 04/10/2017   Hypertensive chronic kidney disease with stage 1 through stage 4 chronic kidney disease, or unspecified chronic kidney disease 04/10/2017   Chronic renal insufficiency 12/18/2016   Essential hypertension 12/18/2016   Intraductal papilloma of breast 05/16/2013   Proteinuria 10/07/2012    Instead of continuing with attempts at aspiration which appear to be ineffective.  Also due to the loculated nature of what was noted by ultrasound on her last exam I feel it may be prudent to proceed with:  Evacuation of right breast postoperative seroma. I believe under general anesthesia she will tolerate this best, will achieve our goals of diminishing the seroma completely, and hopefully get the healing more after and orders to resume her care, and initiate XRT in the near future. Risk discussed regarding anesthesia, bleeding, infection, recurrence of seroma, eventual open wound, etc.  I believe she understands and desires to proceed excepting the risks, having her questions adequately answered.  No guarantees were expressed or implied.   Campbell Lerner M.D., FACS 03/02/2023, 12:31 PM

## 2023-03-02 NOTE — Telephone Encounter (Signed)
Patient has been advised of Pre-Admission date/time, and Surgery date at [facility].  Surgery Date: 03/06/23 Preadmission Testing Date: 03/03/23 (phone 8a-1p)  Patient has been made aware to call [phone], between 1-3:00pm the day before surgery, to find out what time to arrive for surgery.

## 2023-03-02 NOTE — Patient Instructions (Signed)
We have spoken today about having your breast drained and the area cleaned. This will be done by Dr. Claudine Mouton at [facility].  You will most likely be able to leave the hospital several hours after your surgery.   Plan to tentatively be off work for 1 week following the surgery and may return with approximately 2 more weeks of a lifting restriction, no greater than 15 lbs.  Please see your Blue surgery sheet for more information. Our surgery scheduler will call you to look at surgery dates and to go over information.   If you have FMLA or Disability paperwork that needs to be filled out, please have your company fax your paperwork to [phone] or you may drop this by either office. This paperwork will be filled out within 3 days after your surgery has been completed.    AFTER THE PROCEDURE You will be taken to the recovery area. You will be given medicine for pain. A small rubber drain may be placed in the breast for 2-3 days to prevent a collection of blood (hematoma) from developing in the breast. You will be given instructions on caring for the drain before you go home. A pressure bandage (dressing) will be applied for 1-2 days to prevent bleeding. Ask your health care provider how to care for your bandage at home.

## 2023-03-02 NOTE — Progress Notes (Signed)
[facility] POST-OP OFFICE VISIT  03/02/2023  HPI: She returns today in follow-up of her right breast seroma, since her aspiration last week, she has had immediate recurrence of the fluid.  She still finds that wearing her bra is uncomfortable and would hope not to wear any longer. Felicia Butler is a 61 y.o. female  s/p right upper outer quadrant breast lumpectomy, and sentinel lymph node biopsy January 18, 2023. She presents today with no complaints outside of some swelling and soreness.  She reports although the symptoms have significantly improved, she still has a persistent seroma present at the upper outer quadrant of her right breast where her surgery was completed.  At present her radiation treatment is on hold until the seroma can be treated.  She has had no drainage from incisions, denies any fevers or chills.  Diagnosis 1. 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 - SEE CANCER SUMMARY BELOW. - BIOPSY SITE CHANGE WITH CLIP. - INTRADUCTAL PAPILLOMA WITH FLORID DUCTAL HYPERPLASIA, COLUMNAR CELL CHANGE, CYST FORMATION, AND ATYPICAL LOBULAR HYPERPLASIA. 2. Lymph node, sentinel, biopsy, #1 Right - ONE LYMPH NODE NEGATIVE FOR MALIGNANCY (0/1). 3. Lymph node, sentinel, biopsy, #2 Right - ONE LYMPH NODE NEGATIVE FOR MALIGNANCY (0/1). 4. Lymph node, sentinel, biopsy, #3 Right - ONE LYMPH NODE NEGATIVE FOR MALIGNANCY (0/1). Microscopic Comment 1. CANCER CASE SUMMARY: INVASIVE CARCINOMA OF THE BREAST Standard(s): AJCC-UICC 8 SPECIMEN Procedure: Lumpectomy Specimen Laterality: Right TUMOR Histologic Type: Tubular carcinoma Histologic Grade (Nottingham Histologic Score) Glandular (Acinar)/Tubular Differentiation: 1 Nuclear Pleomorphism: 2 Mitotic Rate: 1 Overall Grade: 1 Tumor Size: Greatest dimension of largest invasive focus: 13 mm Ductal Carcinoma In Situ (DCIS): Present, intermediate grade  Tumor  Extent: Not applicable Lymphatic and/or Vascular Invasion: Not identified Treatment Effect in the Breast: No known presurgical therapy MARGINS Margin Status for Invasive Carcinoma: All margins negative for invasive carcinoma Distance from closest margin: 2 mm Specify closest margin: Superior Margin Status for DCIS: All margins negative for DCIS Distance from DCIS to closest margin: 1 mm Specify closest margin: Superior REGIONAL LYMPH NODES Regional Lymph Node Status: All regional lymph nodes negative for tumor Total Number of Lymph Nodes Examined (sentinel and non-sentinel): 3 Number of Sentinel Nodes Examined: 3 DISTANT METASTASIS Distant Site(s) Involved, if applicable: Not applicable PATHOLOGIC STAGE CLASSIFICATION (pTNM, AJCC 8th Edition): Modified Classification: Not applicable pT Category: pT1c T Suffix: Not applicable pN Category: pN0 N Suffix: (sn) pM Category: Not applicable SPECIAL STUDIES Breast Biomarker Testing Performed on Previous Biopsy: [id number] Estrogen Receptor: Positive (100%, strong) Progesterone Receptor: Positive (100%, strong) HER2 IHC: Negative (1+)   Vital signs: BP 130/80   Pulse 80   Temp 98 F (36.7 C)   Ht 5\' 1"  (1.549 m)   Wt 131 lb (59.4 kg)   LMP 11/18/1996 (Approximate)   SpO2 97%   BMI 24.75 kg/m    Physical Exam: Constitutional: She appears well.  Chest clear to auscultation. Heart regular rate and rhythm. Skin/Breast: Her right breast lumpectomy site is quite prominent with a good sized seroma probably at least 7 to 8 cm in diameter.  This is no great surprise considering the the lax dermal envelope about her ptotic right breast.  The incision to the axilla and upper outer quadrant of the right breast both appear clean dry and intact.  There is some peau d'orange-like changes of the dermal skin but I believe this is due to the dependency of the breast, and the interruption of  the draining lymphatics of the upper outer quadrant.  I  do not believe this is evidence of inflammatory cancer.  She really does not complain much of this at all.  Assessment/Plan: This is a 61 y.o. female  s/p lumpectomy of the upper outer quadrant right breast and sentinel lymph node biopsy right axilla, with multiloculated postoperative right upper breast seroma/hematoma.  Patient Active Problem List   Diagnosis Date Noted   Goals of care, counseling/discussion 02/07/2023   Seroma of breast 01/26/2023   Genetic testing 01/23/2023   Invasive carcinoma of breast (HCC) 01/03/2023   Family history of cancer 01/03/2023   Complication of vascular graft 10/09/2022   Hypercalcemia 06/03/2022   Chronic kidney disease (CKD), stage III (moderate) (HCC) 06/03/2022   Hypertension 06/03/2022   Allergy, unspecified, initial encounter 01/30/2022   Anaphylactic shock, unspecified, initial encounter 01/30/2022   Mixed hyperlipidemia 01/06/2021   Contact with and (suspected) exposure to covid-19 11/06/2020   Encounter for screening for COVID-19 11/06/2020   CAD in native artery 09/10/2020   Complication of vascular access for dialysis 08/19/2020   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   Gout 02/27/2020   Hyperlipidemia 02/27/2020   Hypothyroidism 02/27/2020   Obesity 02/27/2020   TB lung, 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   Other disorders of phosphorus metabolism 11/07/2018   COVID-19 10/15/2018   Hyperkalemia 08/08/2018   Anemia in chronic kidney disease 04/10/2017   Coagulation defect, unspecified (HCC) 04/10/2017   Diarrhea, unspecified 04/10/2017   Encounter for immunization 04/10/2017   End stage renal disease (HCC) 04/10/2017   Fever, unspecified 04/10/2017   Headache, unspecified 04/10/2017   Iron deficiency anemia, unspecified 04/10/2017   Moderate protein-calorie malnutrition (HCC) 04/10/2017   Pain, unspecified 04/10/2017   Pruritus, unspecified 04/10/2017   Secondary hyperparathyroidism of renal origin (HCC) 04/10/2017   Shortness  of breath 04/10/2017   Type 2 diabetes mellitus with diabetic chronic kidney disease (HCC) 04/10/2017   Hypertensive chronic kidney disease with stage 1 through stage 4 chronic kidney disease, or unspecified chronic kidney disease 04/10/2017   Chronic renal insufficiency 12/18/2016   Essential hypertension 12/18/2016   Intraductal papilloma of breast 05/16/2013   Proteinuria 10/07/2012    Instead of continuing with attempts at aspiration which appear to be ineffective.  Also due to the loculated nature of what was noted by ultrasound on her last exam I feel it may be prudent to proceed with:  Evacuation of right breast postoperative seroma. I believe under general anesthesia she will tolerate this best, will achieve our goals of diminishing the seroma completely, and hopefully get the healing more after and orders to resume her care, and initiate XRT in the near future. Risk discussed regarding anesthesia, bleeding, infection, recurrence of seroma, eventual open wound, etc.  I believe she understands and desires to proceed excepting the risks, having her questions adequately answered.  No guarantees were expressed or implied.   Campbell Lerner M.D., FACS 03/02/2023, 12:31 PM

## 2023-03-02 NOTE — Progress Notes (Signed)
Dr. Claudine Mouton is taking patient to OR to drain her seroma.   Appt with Dr. Rushie Chestnut rescheduled to 10/16, details given to Ms. Jiminez.

## 2023-03-03 ENCOUNTER — Encounter
Admission: RE | Admit: 2023-03-03 | Discharge: 2023-03-03 | Disposition: A | Discharge status: Home / Self Care | Payer: 59 | Source: Ambulatory Visit | Attending: Surgery | Admitting: Surgery

## 2023-03-03 ENCOUNTER — Other Ambulatory Visit: Payer: Self-pay

## 2023-03-03 VITALS — Ht 61.0 in | Wt 130.0 lb

## 2023-03-03 DIAGNOSIS — N186 End stage renal disease: Secondary | ICD-10-CM

## 2023-03-03 HISTORY — DX: Transient cerebral ischemic attack, unspecified: G45.9

## 2023-03-03 HISTORY — DX: Sleep apnea, unspecified: G47.30

## 2023-03-03 NOTE — Patient Instructions (Addendum)
Your procedure is scheduled on: Monday 03/06/23 Report to the Registration Desk on the 1st floor of the [facility]. Arrival time is 11:00 am please call [phone] with questions Free Valet Parking is available   REMEMBER: Instructions that are not followed completely may result in serious medical risk, up to and including death; or upon the discretion of your surgeon and anesthesiologist your surgery may need to be rescheduled.   Do not eat food or drink any liquids after midnight the night before surgery.  No gum chewing or hard candies.   One week prior to surgery: Stop Anti-inflammatories (NSAIDS) such as Advil, Aleve, Ibuprofen, Motrin, Naproxen, Naprosyn and Aspirin based products such as Excedrin, Goody's Powder, BC Powder.   Stop ANY OVER THE COUNTER supplements until after surgery.   You may however, continue to take Tylenol if needed for pain up until the day of surgery.     TAKE ONLY THESE MEDICATIONS THE MORNING OF SURGERY WITH A SIP OF WATER:   famotidine (PEPCID) - (take one the night before and one on the morning of surgery - helps to prevent nausea after surgery.) amLODipine (NORVASC)  cinacalcet (SENSIPAR) hydrALAZINE (APRESOLINE)  levothyroxine (SYNTHROID, LEVOTHROID)    HOLD losartan (COZAAR) on the day of your surgery.   No Alcohol for 24 hours before or after surgery.   No Smoking including e-cigarettes for 24 hours before surgery.  No chewable tobacco products for at least 6 hours before surgery.  No nicotine patches on the day of surgery.   Do not use any "recreational" drugs for at least a week (preferably 2 weeks) before your surgery.  Please be advised that the combination of cocaine and anesthesia may have negative outcomes, up to and including death. If you test positive for cocaine, your surgery will be cancelled.   On the morning of surgery brush your teeth with toothpaste and water, you may rinse your mouth with mouthwash if you wish. Do not  swallow any toothpaste or mouthwash.   Shower with your regular soap before your arrival. CHG wipes will be provide upon arrival to Day Surgery   Do not wear jewelry, make-up, hairpins, clips or nail polish.   Do not wear lotions, powders, or perfumes. NO DEODORANT   Do not shave body hair from the neck down 48 hours before surgery.   Contact lenses, hearing aids and dentures may not be worn into surgery.   Do not bring valuables to the hospital. [facility] is not responsible for any missing/lost belongings or valuables.    Notify your doctor if there is any change in your medical condition (cold, fever, infection).   Wear comfortable clothing (specific to your surgery type) to the hospital.   After surgery, you can help prevent lung complications by doing breathing exercises.  Take deep breaths and cough every 1-2 hours. Your doctor may order a device called an Incentive Spirometer to help you take deep breaths. When coughing or sneezing, hold a pillow firmly against your incision with both hands. This is called "splinting." Doing this helps protect your incision. It also decreases belly discomfort.   If you are being admitted to the hospital overnight, leave your suitcase in the car. After surgery it may be brought to your room.   In case of increased patient census, it may be necessary for you, the patient, to continue your postoperative care in the Same Day Surgery department.   If you are being discharged the day of surgery, you will  not be allowed to drive home. You will need a responsible individual to drive you home and stay with you for 24 hours after surgery.    If you are taking public transportation, you will need to have a responsible individual with you.   Please call the Pre-admissions Testing Dept. at [phone] if you have any questions about these instructions.   Surgery Visitation Policy:   Patients having surgery or a procedure may have two visitors.   Children under the age of 35 must have an adult with them who is not the patient.   Inpatient Visitation:     Visiting hours are 7 a.m. to 8 p.m. Up to four visitors are allowed at one time in a patient room. The visitors may rotate out with other people during the day.  One visitor age 32 or older may stay with the patient overnight and must be in the room by 8 p.m.

## 2023-03-06 ENCOUNTER — Ambulatory Visit: Payer: 59 | Admitting: Certified Registered"

## 2023-03-06 ENCOUNTER — Telehealth: Payer: Self-pay

## 2023-03-06 ENCOUNTER — Encounter
Admission: RE | Disposition: A | Discharge status: Home / Self Care | Payer: Self-pay | Source: Home / Self Care | Attending: Surgery

## 2023-03-06 ENCOUNTER — Ambulatory Visit: Payer: 59 | Admitting: Radiation Oncology

## 2023-03-06 ENCOUNTER — Other Ambulatory Visit: Payer: Self-pay

## 2023-03-06 ENCOUNTER — Encounter: Payer: Self-pay | Admitting: Surgery

## 2023-03-06 ENCOUNTER — Ambulatory Visit
Admission: RE | Admit: 2023-03-06 | Discharge: 2023-03-06 | Disposition: A | Discharge status: Home / Self Care | Payer: 59 | Attending: Surgery | Admitting: Surgery

## 2023-03-06 DIAGNOSIS — N6489 Other specified disorders of breast: Secondary | ICD-10-CM

## 2023-03-06 DIAGNOSIS — I739 Peripheral vascular disease, unspecified: Secondary | ICD-10-CM | POA: Insufficient documentation

## 2023-03-06 DIAGNOSIS — N183 Chronic kidney disease, stage 3 unspecified: Secondary | ICD-10-CM | POA: Insufficient documentation

## 2023-03-06 DIAGNOSIS — L7634 Postprocedural seroma of skin and subcutaneous tissue following other procedure: Secondary | ICD-10-CM | POA: Diagnosis present

## 2023-03-06 DIAGNOSIS — I5030 Unspecified diastolic (congestive) heart failure: Secondary | ICD-10-CM | POA: Insufficient documentation

## 2023-03-06 DIAGNOSIS — E1122 Type 2 diabetes mellitus with diabetic chronic kidney disease: Secondary | ICD-10-CM | POA: Diagnosis not present

## 2023-03-06 DIAGNOSIS — Z87891 Personal history of nicotine dependence: Secondary | ICD-10-CM | POA: Diagnosis not present

## 2023-03-06 DIAGNOSIS — I130 Hypertensive heart and chronic kidney disease with heart failure and stage 1 through stage 4 chronic kidney disease, or unspecified chronic kidney disease: Secondary | ICD-10-CM | POA: Insufficient documentation

## 2023-03-06 DIAGNOSIS — K219 Gastro-esophageal reflux disease without esophagitis: Secondary | ICD-10-CM | POA: Insufficient documentation

## 2023-03-06 DIAGNOSIS — Z992 Dependence on renal dialysis: Secondary | ICD-10-CM | POA: Diagnosis not present

## 2023-03-06 DIAGNOSIS — E782 Mixed hyperlipidemia: Secondary | ICD-10-CM | POA: Insufficient documentation

## 2023-03-06 DIAGNOSIS — I2510 Atherosclerotic heart disease of native coronary artery without angina pectoris: Secondary | ICD-10-CM | POA: Diagnosis not present

## 2023-03-06 DIAGNOSIS — C50411 Malignant neoplasm of upper-outer quadrant of right female breast: Secondary | ICD-10-CM | POA: Insufficient documentation

## 2023-03-06 DIAGNOSIS — E039 Hypothyroidism, unspecified: Secondary | ICD-10-CM | POA: Diagnosis not present

## 2023-03-06 DIAGNOSIS — Z170 Estrogen receptor positive status [ER+]: Secondary | ICD-10-CM | POA: Diagnosis not present

## 2023-03-06 DIAGNOSIS — D631 Anemia in chronic kidney disease: Secondary | ICD-10-CM | POA: Insufficient documentation

## 2023-03-06 DIAGNOSIS — G4730 Sleep apnea, unspecified: Secondary | ICD-10-CM | POA: Diagnosis not present

## 2023-03-06 DIAGNOSIS — N186 End stage renal disease: Secondary | ICD-10-CM

## 2023-03-06 HISTORY — PX: EVACUATION BREAST HEMATOMA: SHX1537

## 2023-03-06 LAB — POCT I-STAT, CHEM 8
BUN: 35 mg/dL — ABNORMAL HIGH (ref 8–23)
Calcium, Ion: 0.96 mmol/L — ABNORMAL LOW (ref 1.15–1.40)
Chloride: 98 mmol/L (ref 98–111)
Creatinine, Ser: 9.8 mg/dL — ABNORMAL HIGH (ref 0.44–1.00)
Glucose, Bld: 90 mg/dL (ref 70–99)
HCT: 38 % (ref 36.0–46.0)
Hemoglobin: 12.9 g/dL (ref 12.0–15.0)
Potassium: 3 mmol/L — ABNORMAL LOW (ref 3.5–5.1)
Sodium: 137 mmol/L (ref 135–145)
TCO2: 26 mmol/L (ref 22–32)

## 2023-03-06 LAB — CBC WITH DIFFERENTIAL/PLATELET
Abs Immature Granulocytes: 0.03 10*3/uL (ref 0.00–0.07)
Basophils Absolute: 0 10*3/uL (ref 0.0–0.1)
Basophils Relative: 1 %
Eosinophils Absolute: 0.1 10*3/uL (ref 0.0–0.5)
Eosinophils Relative: 1 %
HCT: 33.1 % — ABNORMAL LOW (ref 36.0–46.0)
Hemoglobin: 11.3 g/dL — ABNORMAL LOW (ref 12.0–15.0)
Immature Granulocytes: 0 %
Lymphocytes Relative: 11 %
Lymphs Abs: 0.8 10*3/uL (ref 0.7–4.0)
MCH: 27.2 pg (ref 26.0–34.0)
MCHC: 34.1 g/dL (ref 30.0–36.0)
MCV: 79.8 fL — ABNORMAL LOW (ref 80.0–100.0)
Monocytes Absolute: 0.9 10*3/uL (ref 0.1–1.0)
Monocytes Relative: 12 %
Neutro Abs: 5.6 10*3/uL (ref 1.7–7.7)
Neutrophils Relative %: 75 %
Platelets: 226 10*3/uL (ref 150–400)
RBC: 4.15 MIL/uL (ref 3.87–5.11)
RDW: 17.6 % — ABNORMAL HIGH (ref 11.5–15.5)
WBC: 7.4 10*3/uL (ref 4.0–10.5)
nRBC: 0 % (ref 0.0–0.2)

## 2023-03-06 SURGERY — EVACUATION, HEMATOMA, BREAST
Anesthesia: General | Site: Breast | Laterality: Right

## 2023-03-06 MED ORDER — OXYCODONE HCL 5 MG PO TABS: 5.0000 mg | ORAL_TABLET | Freq: Four times a day (QID) | ORAL | 0 refills | Status: DC | PRN

## 2023-03-06 MED ORDER — DEXAMETHASONE SODIUM PHOSPHATE 10 MG/ML IJ SOLN
INTRAMUSCULAR | Status: AC
  Filled 2023-03-06: qty 1

## 2023-03-06 MED ORDER — ACETAMINOPHEN 500 MG PO TABS
ORAL_TABLET | ORAL | Status: AC
  Filled 2023-03-06: qty 2

## 2023-03-06 MED ORDER — EPHEDRINE 5 MG/ML INJ
INTRAVENOUS | Status: AC
  Filled 2023-03-06: qty 5

## 2023-03-06 MED ORDER — SODIUM CHLORIDE 0.9 % IV SOLN: INTRAVENOUS | Status: DC

## 2023-03-06 MED ORDER — PHENYLEPHRINE HCL-NACL 20-0.9 MG/250ML-% IV SOLN
INTRAVENOUS | Status: DC | PRN
Start: 2023-03-06 — End: 2023-03-06
  Administered 2023-03-06: 75 ug/min via INTRAVENOUS

## 2023-03-06 MED ORDER — CEFAZOLIN SODIUM-DEXTROSE 2-4 GM/100ML-% IV SOLN
INTRAVENOUS | Status: AC
  Filled 2023-03-06: qty 100

## 2023-03-06 MED ORDER — PHENYLEPHRINE 80 MCG/ML (10ML) SYRINGE FOR IV PUSH (FOR BLOOD PRESSURE SUPPORT)
PREFILLED_SYRINGE | INTRAVENOUS | Status: AC
  Filled 2023-03-06: qty 10

## 2023-03-06 MED ORDER — GABAPENTIN 300 MG PO CAPS
300.0000 mg | ORAL_CAPSULE | ORAL | Status: AC
  Administered 2023-03-06: 300 mg via ORAL

## 2023-03-06 MED ORDER — CHLORHEXIDINE GLUCONATE CLOTH 2 % EX PADS: 6.0000 | MEDICATED_PAD | Freq: Once | CUTANEOUS | Status: DC

## 2023-03-06 MED ORDER — ONDANSETRON HCL 4 MG/2ML IJ SOLN: 4.0000 mg | Freq: Once | INTRAMUSCULAR | Status: DC | PRN

## 2023-03-06 MED ORDER — BUPIVACAINE LIPOSOME 1.3 % IJ SUSP: 20.0000 mL | Freq: Once | INTRAMUSCULAR | Status: DC

## 2023-03-06 MED ORDER — BUPIVACAINE-EPINEPHRINE (PF) 0.25% -1:200000 IJ SOLN
INTRAMUSCULAR | Status: AC
  Filled 2023-03-06: qty 30

## 2023-03-06 MED ORDER — FENTANYL CITRATE (PF) 100 MCG/2ML IJ SOLN
25.0000 ug | INTRAMUSCULAR | Status: DC | PRN
  Administered 2023-03-06 (×2): 25 ug via INTRAVENOUS

## 2023-03-06 MED ORDER — OXYCODONE HCL 5 MG PO TABS
5.0000 mg | ORAL_TABLET | Freq: Once | ORAL | Status: AC
  Administered 2023-03-06: 5 mg via ORAL

## 2023-03-06 MED ORDER — VASOPRESSIN 20 UNIT/ML IV SOLN
INTRAVENOUS | Status: DC | PRN
Start: 2023-03-06 — End: 2023-03-06
  Administered 2023-03-06: 1 [IU] via INTRAVENOUS

## 2023-03-06 MED ORDER — ONDANSETRON HCL 4 MG/2ML IJ SOLN
INTRAMUSCULAR | Status: DC | PRN
  Administered 2023-03-06: 4 mg via INTRAVENOUS

## 2023-03-06 MED ORDER — EPHEDRINE SULFATE-NACL 50-0.9 MG/10ML-% IV SOSY
PREFILLED_SYRINGE | INTRAVENOUS | Status: DC | PRN
Start: 2023-03-06 — End: 2023-03-06
  Administered 2023-03-06: 5 mg via INTRAVENOUS

## 2023-03-06 MED ORDER — CHLORHEXIDINE GLUCONATE 0.12 % MT SOLN
15.0000 mL | Freq: Once | OROMUCOSAL | Status: AC
  Administered 2023-03-06: 15 mL via OROMUCOSAL

## 2023-03-06 MED ORDER — PROPOFOL 10 MG/ML IV BOLUS
INTRAVENOUS | Status: DC | PRN
  Administered 2023-03-06: 120 mg via INTRAVENOUS

## 2023-03-06 MED ORDER — CHLORHEXIDINE GLUCONATE 0.12 % MT SOLN
OROMUCOSAL | Status: AC
  Filled 2023-03-06: qty 15

## 2023-03-06 MED ORDER — LIDOCAINE HCL (PF) 2 % IJ SOLN
INTRAMUSCULAR | Status: AC
  Filled 2023-03-06: qty 5

## 2023-03-06 MED ORDER — ORAL CARE MOUTH RINSE: 15.0000 mL | Freq: Once | OROMUCOSAL | Status: AC

## 2023-03-06 MED ORDER — FENTANYL CITRATE (PF) 100 MCG/2ML IJ SOLN
INTRAMUSCULAR | Status: AC
  Filled 2023-03-06: qty 2

## 2023-03-06 MED ORDER — OXYCODONE HCL 5 MG PO TABS
ORAL_TABLET | ORAL | Status: AC
  Filled 2023-03-06: qty 1

## 2023-03-06 MED ORDER — CELECOXIB 200 MG PO CAPS
ORAL_CAPSULE | ORAL | Status: AC
  Filled 2023-03-06: qty 1

## 2023-03-06 MED ORDER — PROPOFOL 10 MG/ML IV BOLUS
INTRAVENOUS | Status: AC
  Filled 2023-03-06: qty 20

## 2023-03-06 MED ORDER — FENTANYL CITRATE (PF) 100 MCG/2ML IJ SOLN
INTRAMUSCULAR | Status: DC | PRN
  Administered 2023-03-06: 25 ug via INTRAVENOUS

## 2023-03-06 MED ORDER — 0.9 % SODIUM CHLORIDE (POUR BTL) OPTIME
TOPICAL | Status: DC | PRN
  Administered 2023-03-06: 500 mL

## 2023-03-06 MED ORDER — CELECOXIB 200 MG PO CAPS
200.0000 mg | ORAL_CAPSULE | ORAL | Status: AC
  Administered 2023-03-06: 200 mg via ORAL

## 2023-03-06 MED ORDER — DEXAMETHASONE SODIUM PHOSPHATE 10 MG/ML IJ SOLN
INTRAMUSCULAR | Status: DC | PRN
  Administered 2023-03-06: 5 mg via INTRAVENOUS

## 2023-03-06 MED ORDER — ONDANSETRON HCL 4 MG/2ML IJ SOLN
INTRAMUSCULAR | Status: AC
  Filled 2023-03-06: qty 2

## 2023-03-06 MED ORDER — PHENYLEPHRINE HCL-NACL 20-0.9 MG/250ML-% IV SOLN
INTRAVENOUS | Status: AC
  Filled 2023-03-06: qty 250

## 2023-03-06 MED ORDER — VASOPRESSIN 20 UNIT/ML IV SOLN
INTRAVENOUS | Status: AC
  Filled 2023-03-06: qty 1

## 2023-03-06 MED ORDER — PHENYLEPHRINE 80 MCG/ML (10ML) SYRINGE FOR IV PUSH (FOR BLOOD PRESSURE SUPPORT)
PREFILLED_SYRINGE | INTRAVENOUS | Status: AC
  Filled 2023-03-06: qty 20

## 2023-03-06 MED ORDER — GABAPENTIN 300 MG PO CAPS
ORAL_CAPSULE | ORAL | Status: AC
  Filled 2023-03-06: qty 1

## 2023-03-06 MED ORDER — EPHEDRINE SULFATE (PRESSORS) 50 MG/ML IJ SOLN
INTRAMUSCULAR | Status: DC | PRN
  Administered 2023-03-06: 10 mg via INTRAVENOUS
  Administered 2023-03-06 (×3): 5 mg via INTRAVENOUS

## 2023-03-06 MED ORDER — LIDOCAINE HCL (CARDIAC) PF 100 MG/5ML IV SOSY
PREFILLED_SYRINGE | INTRAVENOUS | Status: DC | PRN
  Administered 2023-03-06: 60 mg via INTRAVENOUS

## 2023-03-06 MED ORDER — ROCURONIUM BROMIDE 10 MG/ML (PF) SYRINGE
PREFILLED_SYRINGE | INTRAVENOUS | Status: AC
  Filled 2023-03-06: qty 10

## 2023-03-06 MED ORDER — EPHEDRINE 5 MG/ML INJ
INTRAVENOUS | Status: AC
  Filled 2023-03-06: qty 15

## 2023-03-06 MED ORDER — CEFAZOLIN SODIUM-DEXTROSE 2-4 GM/100ML-% IV SOLN
2.0000 g | INTRAVENOUS | Status: AC
  Administered 2023-03-06: 2 g via INTRAVENOUS

## 2023-03-06 MED ORDER — ACETAMINOPHEN 500 MG PO TABS
1000.0000 mg | ORAL_TABLET | ORAL | Status: AC
  Administered 2023-03-06: 1000 mg via ORAL

## 2023-03-06 MED ORDER — MIDAZOLAM HCL 2 MG/2ML IJ SOLN
INTRAMUSCULAR | Status: AC
  Filled 2023-03-06: qty 2

## 2023-03-06 MED ORDER — PHENYLEPHRINE 80 MCG/ML (10ML) SYRINGE FOR IV PUSH (FOR BLOOD PRESSURE SUPPORT)
PREFILLED_SYRINGE | INTRAVENOUS | Status: DC | PRN
  Administered 2023-03-06 (×2): 80 ug via INTRAVENOUS
  Administered 2023-03-06 (×2): 160 ug via INTRAVENOUS

## 2023-03-06 SURGICAL SUPPLY — 39 items
ADH SKN CLS APL DERMABOND .7 (GAUZE/BANDAGES/DRESSINGS) ×1
BLADE SURG 15 STRL LF DISP TIS (BLADE) ×1 IMPLANT
BLADE SURG 15 STRL SS (BLADE) ×1
DERMABOND ADVANCED .7 DNX12 (GAUZE/BANDAGES/DRESSINGS) IMPLANT
DRAIN CHANNEL JP 15F RND 16 (MISCELLANEOUS) IMPLANT
DRAIN PENROSE 12X.25 LTX STRL (MISCELLANEOUS) IMPLANT
DRAIN PENROSE 5/8X18 LTX STRL (DRAIN) IMPLANT
DRAPE LAPAROTOMY 77X122 PED (DRAPES) ×1 IMPLANT
DRSG TEGADERM 2-3/8X2-3/4 SM (GAUZE/BANDAGES/DRESSINGS) IMPLANT
DRSG TEGADERM 4X4.75 (GAUZE/BANDAGES/DRESSINGS) IMPLANT
ELECT CAUTERY BLADE 6.4 (BLADE) ×1 IMPLANT
ELECT REM PT RETURN 9FT ADLT (ELECTROSURGICAL) ×1
ELECTRODE REM PT RTRN 9FT ADLT (ELECTROSURGICAL) ×1 IMPLANT
EVACUATOR SILICONE 100CC (DRAIN) IMPLANT
GAUZE SPONGE 4X4 12PLY STRL (GAUZE/BANDAGES/DRESSINGS) IMPLANT
GLOVE ORTHO TXT STRL SZ7.5 (GLOVE) ×1 IMPLANT
GOWN STRL REUS W/ TWL LRG LVL3 (GOWN DISPOSABLE) ×1 IMPLANT
GOWN STRL REUS W/ TWL XL LVL3 (GOWN DISPOSABLE) ×1 IMPLANT
GOWN STRL REUS W/TWL LRG LVL3 (GOWN DISPOSABLE) ×1
GOWN STRL REUS W/TWL XL LVL3 (GOWN DISPOSABLE) ×1
KIT TURNOVER KIT A (KITS) ×1 IMPLANT
MANIFOLD NEPTUNE II (INSTRUMENTS) ×1 IMPLANT
NDL HYPO 22X1.5 SAFETY MO (MISCELLANEOUS) ×1 IMPLANT
NEEDLE HYPO 22X1.5 SAFETY MO (MISCELLANEOUS) ×1
NS IRRIG 1000ML POUR BTL (IV SOLUTION) ×1 IMPLANT
NS IRRIG 500ML POUR BTL (IV SOLUTION) IMPLANT
PACK BASIN MINOR ARMC (MISCELLANEOUS) ×1 IMPLANT
PAD ABD DERMACEA PRESS 5X9 (GAUZE/BANDAGES/DRESSINGS) IMPLANT
SOL PREP PVP 2OZ (MISCELLANEOUS) ×1
SOLUTION PREP PVP 2OZ (MISCELLANEOUS) ×1 IMPLANT
SPONGE T-LAP 18X18 ~~LOC~~+RFID (SPONGE) IMPLANT
SPONGE VERSALON 4X4 4PLY (MISCELLANEOUS) IMPLANT
SUT ETHILON 3-0 FS-10 30 BLK (SUTURE) ×1
SUT VIC AB 3-0 SH 27 (SUTURE) ×2
SUT VIC AB 3-0 SH 27X BRD (SUTURE) IMPLANT
SUTURE EHLN 3-0 FS-10 30 BLK (SUTURE) IMPLANT
SWAB CULTURE AMIES ANAERIB BLU (MISCELLANEOUS) IMPLANT
SYR 10ML LL (SYRINGE) ×1 IMPLANT
TRAP FLUID SMOKE EVACUATOR (MISCELLANEOUS) ×1 IMPLANT

## 2023-03-06 NOTE — Telephone Encounter (Signed)
Clinical Social Work was referred by [facility] for assessment of psychosocial needs.  CSW attempted to contact patient by phone.  Left voicemail with contact information and request for return call.

## 2023-03-06 NOTE — Interval H&P Note (Signed)
History and Physical Interval Note:  03/06/2023 12:05 PM  Felicia Butler  has presented today for surgery, with the diagnosis of right breast seroma.  The various methods of treatment have been discussed with the patient and family. After consideration of risks, benefits and other options for treatment, the patient has consented to  Procedure(s): EVACUATION HEMATOMA BREAST, seroma (Right) as a surgical intervention.  The patient's history has been reviewed, patient examined, no change in status, stable for surgery.  I have reviewed the patient's chart and labs.  Questions were answered to the patient's satisfaction.   The right breast is marked.   Campbell Lerner

## 2023-03-06 NOTE — Anesthesia Preprocedure Evaluation (Signed)
Anesthesia Evaluation  Patient identified by MRN, date of birth, ID band Patient awake    Reviewed: Allergy & Precautions, NPO status , Patient's Chart, lab work & pertinent test results  Airway Mallampati: II  TM Distance: >3 FB Neck ROM: Full    Dental  (+) Teeth Intact   Pulmonary neg pulmonary ROS, sleep apnea , Patient abstained from smoking., former smoker   Pulmonary exam normal breath sounds clear to auscultation       Cardiovascular Exercise Tolerance: Good hypertension, Pt. on medications + CAD and + Peripheral Vascular Disease  negative cardio ROS Normal cardiovascular exam Rhythm:Regular Rate:Normal     Neuro/Psych TIAnegative neurological ROS  negative psych ROS   GI/Hepatic negative GI ROS, Neg liver ROS,GERD  Medicated,,  Endo/Other  negative endocrine ROSHypothyroidism    Renal/GU DialysisRenal disease     Musculoskeletal   Abdominal Normal abdominal exam  (+)   Peds negative pediatric ROS (+)  Hematology negative hematology ROS (+) Blood dyscrasia, anemia   Anesthesia Other Findings Past Medical History: 12/09/2021: (HFpEF) heart failure with preserved ejection fraction  (HCC)     Comment:  a.) TTE 12/09/2021: EF >55%, no RWMAs, GLS -16.8%, mild               LAE, mod pHTN (RVSP 52), G2DD No date: Anemia associated with chronic renal failure No date: Aortic atherosclerosis (HCC) No date: Arthritis No date: Atherosclerosis of iliac artery No date: Coronary artery disease     Comment:  a.) MV 11/28/2019: no sig CAD, no ischemia; b.) MV               09/21/2021: no sig CAD, no ischemia No date: DDD (degenerative disc disease), lumbar No date: Dyspnea No date: ESRD (end stage renal disease) on dialysis [facility])     Comment:  a.) started HD on M-W-F in 2018 1987: FSGS (focal segmental glomerulosclerosis)     Comment:  a.) diagnosed by Bx in 1987 No date: GERD (gastroesophageal reflux disease) No  date: Gout No date: Heart murmur No date: Hyperlipidemia No date: Hypertension No date: Hypothyroidism 12/27/2022: Invasive carcinoma of RIGHT breast (HCC)     Comment:  a.) CNB of RIGHT breast mass on 12/27/2022 --> invasive               mammary carcinoma (cT1b, cN0, cM0); ER/PR (+), HER2/neu               (-). 05/03/2022: Prolonged QT interval     Comment:  a.) ECG 05/03/2022: QTc = 511 ms; b.) ECG 01/13/2023:               QTc = 577 ms 12/09/2021: Pulmonary HTN (HCC)     Comment:  a.) TTE 12/09/2021: RVSP 52 mmHg No date: Sleep apnea No date: TB lung, latent     Comment:  a.) (+) PPD 09/2011. CXR (-). Declined INH due to               potential for hepatotoxicity. No date: TIA (transient ischemic attack) No date: Umbilical hernia  Past Surgical History: 10/10/2022: A/V FISTULAGRAM; N/A     Comment:  Procedure: A/V Fistulagram;  Surgeon: Annice Needy, MD;               Location: [facility] INVASIVE CV LAB;  Service: Cardiovascular;              Laterality: N/A; 02/01/2022: A/V FISTULAGRAM; N/A     Comment:  Procedure: A/V Fistulagram;  Surgeon: Renford Dills, MD;  Location: [facility] INVASIVE CV LAB;  Service:               Cardiovascular;  Laterality: N/A; 11/08/2022: A/V FISTULAGRAM; Left     Comment:  Procedure: A/V Fistulagram;  Surgeon: Renford Dills, MD;  Location: [facility] INVASIVE CV LAB;  Service:               Cardiovascular;  Laterality: Left; No date: ABDOMINAL HYSTERECTOMY 01/18/2017: AV FISTULA PLACEMENT; Left     Comment:  Procedure: ARTERIOVENOUS (AV) FISTULA CREATION (               BRACHIAL CEPHALIC );  Surgeon: Renford Dills, MD;                Location: [facility] ORS;  Service: Vascular;  Laterality:               Left; 04/30/2013: BREAST BIOPSY; Right     Comment:  intraductal papilloma 04/30/2013: BREAST BIOPSY; Right     Comment:  cyst aspiration 05/27/2013: BREAST BIOPSY; Right     Comment:  subareolar duct  excision 05/27/2013: BREAST BIOPSY; Left     Comment:  subareolar duct excision 08/11/2016: BREAST BIOPSY; Left     Comment:  benign 12/27/2022: BREAST BIOPSY; Right     Comment:  [place] bx/ ribbon clip/ path pending 12/27/2022: BREAST BIOPSY; Right     Comment:  [place] RT BREAST BX W LOC DEV 1ST LESION IMG BX SPEC [place]               GUIDE 12/27/2022 [facility] 01/18/2023: BREAST LUMPECTOMY WITH SENTINEL LYMPH NODE BIOPSY; Right     Comment:  Procedure: BREAST LUMPECTOMY WITH SENTINEL LYMPH NODE               BX;  Surgeon: Campbell Lerner, MD;  Location: [facility] ORS;               Service: General;  Laterality: Right; 06/28/2018: COLONOSCOPY 02/01/2022: DIALYSIS/PERMA CATHETER INSERTION; N/A     Comment:  Procedure: DIALYSIS/PERMA CATHETER INSERTION;  Surgeon:               Renford Dills, MD;  Location: [facility] INVASIVE CV LAB;               Service: Cardiovascular;  Laterality: N/A; 07/28/2022: DIALYSIS/PERMA CATHETER REMOVAL; N/A     Comment:  Procedure: DIALYSIS/PERMA CATHETER REMOVAL;  Surgeon:               Annice Needy, MD;  Location: [facility] INVASIVE CV LAB;                Service: Cardiovascular;  Laterality: N/A; 1960's: HERNIA REPAIR No date: PARTIAL HYSTERECTOMY     Comment:  1980's 04/18/2017: PERIPHERAL VASCULAR THROMBECTOMY; Left     Comment:  Procedure: PERIPHERAL VASCULAR THROMBECTOMY;  Surgeon:               Renford Dills, MD;  Location: [facility] INVASIVE CV LAB;               Service: Cardiovascular;  Laterality: Left; 10/09/2022: REVISION OF ARTERIOVENOUS GORETEX GRAFT; Left     Comment:  Procedure: REVISION OF ARTERIOVENOUS FISTULA;  Surgeon:  Nada Libman, MD;  Location: [facility] ORS;  Service:               Vascular;  Laterality: Left; 05/06/2022: REVISON OF ARTERIOVENOUS FISTULA; Left     Comment:  Procedure: REVISON OF ARTERIOVENOUS FISTULA (BRACHIAL               CEPHALIC);  Surgeon: Renford Dills, MD;  Location:               [facility] ORS;   Service: Vascular;  Laterality: Left;  BMI    Body Mass Index: 24.56 kg/m      Reproductive/Obstetrics negative OB ROS                             Anesthesia Physical Anesthesia Plan  ASA: 3  Anesthesia Plan: General   Post-op Pain Management:    Induction: Intravenous  PONV Risk Score and Plan: Dexamethasone, Ondansetron, Midazolam and Treatment may vary due to age or medical condition  Airway Management Planned: LMA  Additional Equipment:   Intra-op Plan:   Post-operative Plan: Extubation in OR  Informed Consent: I have reviewed the patients History and Physical, chart, labs and discussed the procedure including the risks, benefits and alternatives for the proposed anesthesia with the patient or authorized representative who has indicated his/her understanding and acceptance.     Dental Advisory Given  Plan Discussed with: CRNA and Surgeon  Anesthesia Plan Comments:        Anesthesia Quick Evaluation

## 2023-03-06 NOTE — Op Note (Signed)
Incision and drainage of complex postoperative hematoma/seroma, right breast  Pre-operative Diagnosis: Complex postoperative seroma hematoma right breast.  Post-operative Diagnosis: same.    Surgeon: Campbell Lerner, M.D., FACS  Anesthesia: General  Findings: Multiloculated, with gelatinous consistency large volume collection and somewhat emaciated pendulous breast.  Causing secondary peau d'orange changes due to its upper outer quadrant location.  Estimated Blood Loss: 5 mL         Specimens: None          Complications: none              Procedure Details  The patient was seen again in the Holding Room. The benefits, complications, treatment options, and expected outcomes were discussed with the patient. The risks of bleeding, infection, recurrence of symptoms, failure to resolve symptoms, unanticipated injury, prosthetic placement, prosthetic infection, any of which could require further surgery were reviewed with the patient. The likelihood of improving the patient's symptoms with return to their baseline status is hopeful.  The patient and/or family concurred with the proposed plan, giving informed consent.  The patient was taken to Operating Room, identified and the procedure verified.    Prior to the induction of general anesthesia, antibiotic prophylaxis was administered. VTE prophylaxis was in place.  General anesthesia was then administered and tolerated well. After the induction, the patient was positioned in the supine position and the right breast was prepped with  Chloraprep and draped in the sterile fashion.  A Time Out was held and the above information confirmed.  Transverse incision is made through the previous scar, obtaining immediate egress of serous fluid.  I extended the incision utilizing electrocautery to maintain adequate hemostasis.  Gelatinous multiloculated cavities identified, and contents scooped out including a mixture of blood clot, fibrinous exudates, and  coagulated serum.  Dry gauze was utilized to help debride the cavity. Attempts were then made to utilize 2-0 Vicryl and 3-0 Vicryl sutures to try to close this dead space.  However due to the inability of her tissues to hold this type of suture and the dependency of her breast, and knowing her unwillingness at maintaining support, I felt it prudent to proceed with close drain placement.  A 15 Blake drain was placed within this cavity, and the exit site was deep to the breast and inferior lateral.  I secured to the skin with 3-0 nylon.  And then proceeded with closure of the breast wound with interrupted 3-0 dermal approximating Vicryl's, followed by Dermabond.  Drain dressings applied.  Patient tolerated procedure well. Instructions and orders given for drain maintenance at home and will follow-up in the office within a week.   Campbell Lerner M.D., [facility] 03/06/2023 2:54 PM

## 2023-03-06 NOTE — Discharge Instructions (Signed)

## 2023-03-06 NOTE — Progress Notes (Signed)
   03/06/23 1200  Spiritual Encounters  Type of Visit Initial  Care provided to: Patient  Referral source Chaplain assessment  Reason for visit Routine spiritual support  OnCall Visit Yes   Chaplain provided compassionate presence and empathy. Chaplain spiritual support services remain available.

## 2023-03-06 NOTE — Transfer of Care (Signed)
Immediate Anesthesia Transfer of Care Note  Patient: Felicia Butler  Procedure(s) Performed: EVACUATION HEMATOMA BREAST, seroma (Right: Breast)  Patient Location: PACU  Anesthesia Type:General  Level of Consciousness: drowsy and patient cooperative  Airway & Oxygen Therapy: Patient Spontanous Breathing and Patient connected to face mask oxygen  Post-op Assessment: Report given to RN, Post -op Vital signs reviewed and stable, and Patient moving all extremities X 4  Post vital signs: Reviewed and stable  Last Vitals:  Vitals Value Taken Time  BP 138/69 03/06/23 1435  Temp    Pulse 67 03/06/23 1437  Resp    SpO2 98 % 03/06/23 1437  Vitals shown include unfiled device data.  Last Pain:  Vitals:   03/06/23 1105  PainSc: 0-No pain         Complications: No notable events documented.

## 2023-03-06 NOTE — Anesthesia Procedure Notes (Signed)
Procedure Name: LMA Insertion Date/Time: 03/06/2023 1:28 PM  Performed by: Lanell Matar, CRNAPre-anesthesia Checklist: Patient identified, Emergency Drugs available, Suction available and Patient being monitored Patient Re-evaluated:Patient Re-evaluated prior to induction Oxygen Delivery Method: Circle System Utilized Preoxygenation: Pre-oxygenation with 100% oxygen Induction Type: IV induction Ventilation: Mask ventilation without difficulty LMA: LMA inserted LMA Size: 3.0 Number of attempts: 1 Airway Equipment and Method: Bite block Placement Confirmation: positive ETCO2 Tube secured with: Tape Dental Injury: Teeth and Oropharynx as per pre-operative assessment

## 2023-03-07 ENCOUNTER — Encounter: Payer: Self-pay | Admitting: Surgery

## 2023-03-07 ENCOUNTER — Encounter: Payer: Self-pay | Admitting: *Deleted

## 2023-03-07 NOTE — Progress Notes (Signed)
Appt moved with Marisue Humble to next Wed. Per Marisue Humble because a drain was placed by Dr. Claudine Mouton yesterday.    Appt. Details given to Ms. Burston.

## 2023-03-07 NOTE — Anesthesia Postprocedure Evaluation (Signed)
Anesthesia Post Note  Patient: Felicia Butler  Procedure(s) Performed: EVACUATION HEMATOMA BREAST, seroma (Right: Breast)  Patient location during evaluation: PACU Anesthesia Type: General Level of consciousness: awake Pain management: satisfactory to patient Vital Signs Assessment: post-procedure vital signs reviewed and stable Respiratory status: spontaneous breathing and nonlabored ventilation Cardiovascular status: blood pressure returned to baseline Anesthetic complications: no   No notable events documented.   Last Vitals:  Vitals:   03/06/23 1515 03/06/23 1537  BP: 112/66 117/61  Pulse: 63 60  Resp: 20 16  Temp:  (!) 36.2 C  SpO2: 93% 95%    Last Pain:  Vitals:   03/06/23 1537  TempSrc: Temporal  PainSc: 5                  VAN STAVEREN,Jakyah Bradby

## 2023-03-08 ENCOUNTER — Inpatient Hospital Stay: Payer: 59 | Attending: Oncology | Admitting: Occupational Therapy

## 2023-03-14 ENCOUNTER — Encounter: Payer: Self-pay | Admitting: Surgery

## 2023-03-14 ENCOUNTER — Ambulatory Visit: Payer: 59 | Admitting: Surgery

## 2023-03-14 VITALS — BP 153/66 | HR 79 | Temp 98.5°F | Ht 61.0 in | Wt 140.0 lb

## 2023-03-14 DIAGNOSIS — N6489 Other specified disorders of breast: Secondary | ICD-10-CM

## 2023-03-14 DIAGNOSIS — C50919 Malignant neoplasm of unspecified site of unspecified female breast: Secondary | ICD-10-CM

## 2023-03-14 DIAGNOSIS — C50411 Malignant neoplasm of upper-outer quadrant of right female breast: Secondary | ICD-10-CM

## 2023-03-14 DIAGNOSIS — Z08 Encounter for follow-up examination after completed treatment for malignant neoplasm: Secondary | ICD-10-CM

## 2023-03-14 NOTE — Progress Notes (Signed)
[facility] POST-OP OFFICE VISIT  03/14/2023  HPI: Felicia Butler is a 62 y.o. female  s/p right breast postoperative seroma drainage.  She presents with the original drain dressing from the OR.  Her record of JP output shows diminished output with eventual cessation of output.  She denies any pain, fevers or chills.  Vital signs: BP (!) 153/66   Pulse 79   Temp 98.5 F (36.9 C)   Ht 5\' 1"  (1.549 m)   Wt 140 lb (63.5 kg)   LMP 11/18/1996 (Approximate)   SpO2 98%   BMI 26.45 kg/m    Physical Exam: Constitutional: She appears at her baseline.  Skin: The right breast still has significant edematous changes and swelling.  I cannot appreciate a fluid collection consistent with a recurrent seroma or hematoma.  It is still significantly larger compared to the left.  The drain site under the right breast is clean and dry.  I milked the drain but could not get any additional output.  I proceeded with drain removal since it is no longer functional.  Dry dressing applied.  Caryl Lyn present as chaperone  Assessment/Plan: This is a 62 y.o. female s/p drain placement for postoperative hematoma/seroma.  Patient Active Problem List   Diagnosis Date Noted   Goals of care, counseling/discussion 02/07/2023   Seroma of breast 01/26/2023   Genetic testing 01/23/2023   Invasive carcinoma of breast (HCC) 01/03/2023   Family history of cancer 01/03/2023   Complication of vascular graft 10/09/2022   Hypercalcemia 06/03/2022   Chronic kidney disease (CKD), stage III (moderate) (HCC) 06/03/2022   Hypertension 06/03/2022   Allergy, unspecified, initial encounter 01/30/2022   Anaphylactic shock, unspecified, initial encounter 01/30/2022   Mixed hyperlipidemia 01/06/2021   Contact with and (suspected) exposure to covid-19 11/06/2020   Encounter for screening for COVID-19 11/06/2020   CAD in native artery 09/10/2020   Complication of vascular access for dialysis 08/19/2020   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   Gout 02/27/2020   Hyperlipidemia 02/27/2020   Hypothyroidism 02/27/2020   Obesity 02/27/2020   TB lung, 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   Other disorders of phosphorus metabolism 11/07/2018   COVID-19 10/15/2018   Hyperkalemia 08/08/2018   Anemia in chronic kidney disease 04/10/2017   Coagulation defect, unspecified (HCC) 04/10/2017   Diarrhea, unspecified 04/10/2017   Encounter for immunization 04/10/2017   End stage renal disease (HCC) 04/10/2017   Fever, unspecified 04/10/2017   Headache, unspecified 04/10/2017   Iron deficiency anemia, unspecified 04/10/2017   Moderate protein-calorie malnutrition (HCC) 04/10/2017   Pain, unspecified 04/10/2017   Pruritus, unspecified 04/10/2017   Secondary hyperparathyroidism of renal origin (HCC) 04/10/2017   Shortness of breath 04/10/2017   Type 2 diabetes mellitus with diabetic chronic kidney disease (HCC) 04/10/2017   Hypertensive chronic kidney disease with stage 1 through stage 4 chronic kidney disease, or unspecified chronic kidney disease 04/10/2017   Chronic renal insufficiency 12/18/2016   Essential hypertension 12/18/2016   Intraductal papilloma of breast 05/16/2013   Proteinuria 10/07/2012    -She reports she has follow-up with Dr. Rushie Chestnut tomorrow, uncertain of her breast condition has improved significantly since the attempted drainage of her seroma.  I will be glad to see her back as needed.   Campbell Lerner M.D., FACS 03/14/2023, 4:10 PM

## 2023-03-14 NOTE — Patient Instructions (Signed)
Keep a clean dry gauze over the area and change at least once a day or if it gets saturated.   Follow up with Dr Aggie Cosier tomorrow and follow up per his advise.      Follow-up with our office as needed.  Please call and ask to speak with a nurse if you develop questions or concerns.

## 2023-03-15 ENCOUNTER — Encounter: Payer: Self-pay | Admitting: Radiation Oncology

## 2023-03-15 ENCOUNTER — Inpatient Hospital Stay: Payer: 59 | Attending: Oncology | Admitting: Occupational Therapy

## 2023-03-15 ENCOUNTER — Ambulatory Visit
Admission: RE | Admit: 2023-03-15 | Discharge: 2023-03-15 | Disposition: A | Discharge status: Home / Self Care | Payer: 59 | Source: Ambulatory Visit | Attending: Radiation Oncology | Admitting: Radiation Oncology

## 2023-03-15 VITALS — BP 137/71 | HR 72 | Temp 98.5°F | Resp 16 | Ht 61.0 in | Wt 136.0 lb

## 2023-03-15 DIAGNOSIS — C50411 Malignant neoplasm of upper-outer quadrant of right female breast: Secondary | ICD-10-CM | POA: Insufficient documentation

## 2023-03-15 DIAGNOSIS — Z170 Estrogen receptor positive status [ER+]: Secondary | ICD-10-CM | POA: Diagnosis not present

## 2023-03-15 DIAGNOSIS — N6489 Other specified disorders of breast: Secondary | ICD-10-CM

## 2023-03-15 NOTE — Therapy (Signed)
[facility] at [facility] [address], [address] [place], [place], [address] Phone: [phone]   Fax:  [phone]  Occupational Therapy Screen  Patient Details  Name: Felicia Butler MRN: [id number] Date of Birth: 10/18/1960 No data recorded  Encounter Date: 03/15/2023   OT End of Session - 03/15/23 1744     Visit Number 0             Past Medical History:  Diagnosis Date   (HFpEF) heart failure with preserved ejection fraction (HCC) 12/09/2021   a.) TTE 12/09/2021: EF >55%, no RWMAs, GLS -16.8%, mild LAE, mod pHTN (RVSP 52), G2DD   Anemia associated with chronic renal failure    Aortic atherosclerosis (HCC)    Arthritis    Atherosclerosis of iliac artery    Coronary artery disease    a.) MV 11/28/2019: no sig CAD, no ischemia; b.) MV 09/21/2021: no sig CAD, no ischemia   DDD (degenerative disc disease), lumbar    Dyspnea    ESRD (end stage renal disease) on dialysis [facility])    a.) started HD on M-W-F in 2018   FSGS (focal segmental glomerulosclerosis) 1987   a.) diagnosed by Bx in 1987   GERD (gastroesophageal reflux disease)    Gout    Heart murmur    Hyperlipidemia    Hypertension    Hypothyroidism    Invasive carcinoma of RIGHT breast (HCC) 12/27/2022   a.) CNB of RIGHT breast mass on 12/27/2022 --> invasive mammary carcinoma (cT1b, cN0, cM0); ER/PR (+), HER2/neu (-).   Prolonged QT interval 05/03/2022   a.) ECG 05/03/2022: QTc = 511 ms; b.) ECG 01/13/2023: QTc = 577 ms   Pulmonary HTN (HCC) 12/09/2021   a.) TTE 12/09/2021: RVSP 52 mmHg   Sleep apnea    TB lung, latent    a.) (+) PPD 09/2011. CXR (-). Declined INH due to potential for hepatotoxicity.   TIA (transient ischemic attack)    Umbilical hernia     Past Surgical History:  Procedure Laterality Date   A/V FISTULAGRAM N/A 10/10/2022   Procedure: A/V Fistulagram;  Surgeon: Annice Needy, MD;  Location: [facility] IN[facility]AS[facility] CV [facility];  Service: Cardiovascular;  Laterality:  N/A;   A/V FISTULAGRAM N/A 02/01/2022   Procedure: A/V Fistulagram;  Surgeon: Renford Dills, MD;  Location: [facility];  Service: Cardiovascular;  Laterality: N/A;   A/V FISTULAGRAM Left 11/08/2022   Procedure: A/V Fistulagram;  Surgeon: Renford Dills, MD;  Location: [facility];  Service: Cardiovascular;  Laterality: Left;   ABDOMINAL HYSTERECTOMY     AV FISTULA PLACEMENT Left 01/18/2017   Procedure: ARTERIOVENOUS (AV) FISTULA CREATION ( BRACHIAL CEPHALIC );  Surgeon: Renford Dills, MD;  Location: [facility] ORS;  Service: Vascular;  Laterality: Left;   BREAST BIOPSY Right 04/30/2013   intraductal papilloma   BREAST BIOPSY Right 04/30/2013   cyst aspiration   BREAST BIOPSY Right 05/27/2013   subareolar duct excision   BREAST BIOPSY Left 05/27/2013   subareolar duct excision   BREAST BIOPSY Left 08/11/2016   benign   BREAST BIOPSY Right 12/27/2022   [place] bx/ ribbon clip/ path pending   BREAST BIOPSY Right 12/27/2022   [place] RT BREAST BX W LOC DEV 1ST LESION IMG BX SPEC US GUIDE 12/27/2022 [facility]   BREAST LUMPECTOMY WITH SENTINEL LYMPH NODE BIOPSY Right 01/18/2023   Procedure: BREAST LUMPECTOMY WITH SENTINEL LYMPH NODE BX;  Surgeon: Campbell Lerner, MD;  Location: [facility] ORS;  Service:  General;  Laterality: Right;   COLONOSCOPY  06/28/2018   DIALYSIS/PERMA CATHETER INSERTION N/A 02/01/2022   Procedure: DIALYSIS/PERMA CATHETER INSERTION;  Surgeon: Renford Dills, MD;  Location: [facility];  Service: Cardiovascular;  Laterality: N/A;   DIALYSIS/PERMA CATHETER REMOVAL N/A 07/28/2022   Procedure: DIALYSIS/PERMA CATHETER REMOVAL;  Surgeon: Annice Needy, MD;  Location: [facility];  Service: Cardiovascular;  Laterality: N/A;   EVACUATION BREAST HEMATOMA Right 03/06/2023   Procedure: EVACUATION HEMATOMA BREAST, seroma;  Surgeon: Campbell Lerner, MD;  Location: [facility] OR[facility];  Service: General;  Laterality: Right;   HERNIA REPAIR  1960's    PARTIAL HYSTERECTOMY     1980's   PERIPHERAL VASCULAR THROMBECTOMY Left 04/18/2017   Procedure: PERIPHERAL VASCULAR THROMBECTOMY;  Surgeon: Renford Dills, MD;  Location: [facility];  Service: Cardiovascular;  Laterality: Left;   REVISION OF ARTERIOVENOUS GORETEX GRAFT Left 10/09/2022   Procedure: REVISION OF ARTERIOVENOUS FISTULA;  Surgeon: Nada Libman, MD;  Location: [facility];  Service: Vascular;  Laterality: Left;   REVISON OF ARTERIOVENOUS FISTULA Left 05/06/2022   Procedure: REVISON OF ARTERIOVENOUS FISTULA (BRACHIAL CEPHALIC);  Surgeon: Renford Dills, MD;  Location: [facility];  Service: Vascular;  Laterality: Left;    There were no vitals filed for this visit.   Subjective Assessment - 03/15/23 1743     Subjective  Seen the surgeon yesterday and pulled the drain - my breast still larger but smaller than it was - seen radiation Dr and getting mapped the 29th Oct                 OT SCREEN 02/22/23:  Patient had right lumpectomy on 01/18/2023 by Dr. Claudine Mouton. Bilateral upper extremity range of motion within normal limits and strength within functional limits. Circumference of bilateral upper extremity taken and within normal limits. Patient lives with sister and son lives with them.  Patient is right-hand dominant.  Patient on disability. Patient has a fistula on the left-getting dialysis 3 times a week. Patient has a follow-up with surgeon tomorrow to drain seroma. Possible start for radiation after seroma is drained in 2 to 3 weeks. Right breast heavier and fuller but patient denies pain. 31 cm from posterior to sternum. Patient to follow-up with me again in 2 weeks.  Dr Claudine Mouton 03/14/23: Assessment/Plan: This is a 62 y.o. female s/p drain placement for postoperative hematoma/seroma 03/06/23.  Skin: The right breast still has significant edematous changes and swelling.  I cannot appreciate a fluid collection consistent with a recurrent seroma or  hematoma.  It is still significantly larger compared to the left.  The drain site under the right breast is clean and dry.  I milked the drain but could not get any additional output.  I proceeded with drain removal since it is no longer functional.  Dry dressing applied.  Caryl Lyn present as chaperone    OT SCREEN 03/15/23:  Pt return for follow up - pt had drain put in R breast hematoma/seroma - drain pulledl yesterday.  The right breast still with significant edematous changes and swelling.  R breast still significantly larger compared to the left.  Bandage in place at drain site under the right breast is clean and dry.  Pt seen Dr Aggie Cosier and mapping for radiation 03/28/23 - pt report she is wearing sports bra for compression but did not wear today- breast still from posterior to sternum 37 cm  Pt was ed on soft tissue massage to decrease fibrosis  on R breast followed by MLD from R AAA to L axilla To do 2-3 min on breast fibrotic tech and MLD to follow 20 reps - light and slow  And follow up in week                                     Visit Diagnosis: Seroma of breast    Problem List Patient Active Problem List   Diagnosis Date Noted   Goals of care, counseling/discussion 02/07/2023   Seroma of breast 01/26/2023   Genetic testing 01/23/2023   Invasive carcinoma of breast (HCC) 01/03/2023   Family history of cancer 01/03/2023   Complication of vascular graft 10/09/2022   Hypercalcemia 06/03/2022   Chronic kidney disease (CKD), stage III (moderate) (HCC) 06/03/2022   Hypertension 06/03/2022   Allergy, unspecified, initial encounter 01/30/2022   Anaphylactic shock, unspecified, initial encounter 01/30/2022   Mixed hyperlipidemia 01/06/2021   Contact with and (suspected) exposure to covid-19 11/06/2020   Encounter for screening for COVID-19 11/06/2020   CAD in native artery 09/10/2020   Complication of vascular access for dialysis 08/19/2020   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   Gout 02/27/2020   Hyperlipidemia 02/27/2020   Hypothyroidism 02/27/2020   Obesity 02/27/2020   TB lung, 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   Other disorders of phosphorus metabolism 11/07/2018   COVID-19 10/15/2018   Hyperkalemia 08/08/2018   Anemia in chronic kidney disease 04/10/2017   Coagulation defect, unspecified (HCC) 04/10/2017   Diarrhea, unspecified 04/10/2017   Encounter for immunization 04/10/2017   End stage renal disease (HCC) 04/10/2017   Fever, unspecified 04/10/2017   Headache, unspecified 04/10/2017   Iron deficiency anemia, unspecified 04/10/2017   Moderate protein-calorie malnutrition (HCC) 04/10/2017   Pain, unspecified 04/10/2017   Pruritus, unspecified 04/10/2017   Secondary hyperparathyroidism of renal origin (HCC) 04/10/2017   Shortness of breath 04/10/2017   Type 2 diabetes mellitus with diabetic chronic kidney disease (HCC) 04/10/2017   Hypertensive chronic kidney disease with stage 1 through stage 4 chronic kidney disease, or unspecified chronic kidney disease 04/10/2017   Chronic renal insufficiency 12/18/2016   Essential hypertension 12/18/2016   Intraductal papilloma of breast 05/16/2013   Proteinuria 10/07/2012    Oletta Cohn, OTR/L,CLT 03/15/2023, 5:44 PM  [facility] at [facility] [address], [address] [place], [place], [address] Phone: [phone]   Fax:  [phone]  Name: Felicia Butler MRN: [id number] Date of Birth: [birth year]

## 2023-03-15 NOTE — Progress Notes (Signed)
Radiation Oncology Follow up Note  Name: Felicia Butler   Date:   03/15/2023 MRN:  [id number] DOB: 1961-01-11    This 62 y.o. female presents to the clinic today for reevaluation after drainage of a large right breast seroma and patient with known stage Ia invasive mammary carcinoma ER/PR positive.  REFERRING PROVIDER: Resa Miner, MD  HPI: Patient is a 62 year old female originally seen back in September for stage Ia (pT1c pN0 M0) ER/PR positive invasive mammary carcinoma.  She developed a large seroma which has recently been drained she just had the catheter removed yesterday.  She is doing well still somewhat tender..  COMPLICATIONS OF TREATMENT: none  FOLLOW UP COMPLIANCE: keeps appointments   PHYSICAL EXAM:  BP 137/71   Pulse 72   Temp 98.5 F (36.9 C) (Tympanic)   Resp 16   Ht 5\' 1"  (1.549 m) Comment: stated ht  Wt 136 lb (61.7 kg)   LMP 11/18/1996 (Approximate)   BMI 25.70 kg/m  Area where the catheter was seems to be healing well.  Breast is markedly reduced in size.  No dominant masses noted in either breast.  RADIOLOGY RESULTS: No current films for review  PLAN: Present time now we will go ahead with whole breast radiation a hypofractionated regiment.  Will treat her over 3 weeks will boost her scar another 1600 centigrade based on the close less than 1 mm DCIS margin.  Margin for invasive cancer was 2 mm.  Her Oncotype DX showed low risk for recurrence and she will not receive systemic treatment.  I have given her another week and a half to heal and have set up for simulation at that time.  Risks and benefits of treatment including skin reaction fatigue alteration blood counts possible inclusion of superficial lung all were reviewed in detail with the patient.  She seems to comprehend my treatment plan well.  I would like to take this opportunity to thank you for allowing me to participate in the care of your patient.Carmina Miller, MD

## 2023-03-21 ENCOUNTER — Encounter: Payer: 59 | Admitting: Surgery

## 2023-03-21 ENCOUNTER — Ambulatory Visit
Admission: RE | Admit: 2023-03-21 | Discharge: 2023-03-21 | Disposition: A | Discharge status: Home / Self Care | Payer: 59 | Source: Ambulatory Visit | Attending: Oncology | Admitting: Oncology

## 2023-03-21 DIAGNOSIS — C50919 Malignant neoplasm of unspecified site of unspecified female breast: Secondary | ICD-10-CM | POA: Insufficient documentation

## 2023-03-21 DIAGNOSIS — Z803 Family history of malignant neoplasm of breast: Secondary | ICD-10-CM | POA: Diagnosis not present

## 2023-03-21 DIAGNOSIS — Z780 Asymptomatic menopausal state: Secondary | ICD-10-CM | POA: Insufficient documentation

## 2023-03-21 DIAGNOSIS — M8580 Other specified disorders of bone density and structure, unspecified site: Secondary | ICD-10-CM | POA: Diagnosis not present

## 2023-03-22 ENCOUNTER — Inpatient Hospital Stay: Payer: 59 | Admitting: Occupational Therapy

## 2023-03-22 DIAGNOSIS — N6489 Other specified disorders of breast: Secondary | ICD-10-CM

## 2023-03-22 NOTE — Therapy (Signed)
[facility] [address] [place], [place], [address] Phone: [phone]   Fax:  [phone]  Occupational Therapy Screen  Patient Details  Name: Felicia Butler MRN: [id number] Date of Birth: 1960-09-07 No data recorded  Encounter Date: 03/22/2023   OT End of Session - 03/22/23 1718     Visit Number 0             Past Medical History:  Diagnosis Date   (HFpEF) heart failure with preserved ejection fraction (HCC) 12/09/2021   a.) TTE 12/09/2021: EF >55%, no RWMAs, GLS -16.8%, mild LAE, mod pHTN (RVSP 52), G2DD   Anemia associated with chronic renal failure    Aortic atherosclerosis (HCC)    Arthritis    Atherosclerosis of iliac artery    Coronary artery disease    a.) MV 11/28/2019: no sig CAD, no ischemia; b.) MV 09/21/2021: no sig CAD, no ischemia   DDD (degenerative disc disease), lumbar    Dyspnea    ESRD (end stage renal disease) on dialysis [facility])    a.) started HD on M-W-F in 2018   FSGS (focal segmental glomerulosclerosis) 1987   a.) diagnosed by Bx in 1987   GERD (gastroesophageal reflux disease)    Gout    Heart murmur    Hyperlipidemia    Hypertension    Hypothyroidism    Invasive carcinoma of RIGHT breast (HCC) 12/27/2022   a.) CNB of RIGHT breast mass on 12/27/2022 --> invasive mammary carcinoma (cT1b, cN0, cM0); ER/PR (+), HER2/neu (-).   Prolonged QT interval 05/03/2022   a.) ECG 05/03/2022: QTc = 511 ms; b.) ECG 01/13/2023: QTc = 577 ms   Pulmonary HTN (HCC) 12/09/2021   a.) TTE 12/09/2021: RVSP 52 mmHg   Sleep apnea    TB lung, latent    a.) (+) PPD 09/2011. CXR (-). Declined INH due to potential for hepatotoxicity.   TIA (transient ischemic attack)    Umbilical hernia     Past Surgical History:  Procedure Laterality Date   A/V FISTULAGRAM N/A 10/10/2022   Procedure: A/V Fistulagram;  Surgeon: Annice Needy, MD;  Location: [facility] INVASIVE CV LAB;  Service: Cardiovascular;  Laterality:  N/A;   A/V FISTULAGRAM N/A 02/01/2022   Procedure: A/V Fistulagram;  Surgeon: Renford Dills, MD;  Location: [facility];  Service: Cardiovascular;  Laterality: N/A;   A/V FISTULAGRAM Left 11/08/2022   Procedure: A/V Fistulagram;  Surgeon: Renford Dills, MD;  Location: [facility];  Service: Cardiovascular;  Laterality: Left;   ABDOMINAL HYSTERECTOMY     AV FISTULA PLACEMENT Left 01/18/2017   Procedure: ARTERIOVENOUS (AV) FISTULA CREATION ( BRACHIAL CEPHALIC );  Surgeon: Renford Dills, MD;  Location: [facility] ORS;  Service: Vascular;  Laterality: Left;   BREAST BIOPSY Right 04/30/2013   intraductal papilloma   BREAST BIOPSY Right 04/30/2013   cyst aspiration   BREAST BIOPSY Right 05/27/2013   subareolar duct excision   BREAST BIOPSY Left 05/27/2013   subareolar duct excision   BREAST BIOPSY Left 08/11/2016   benign   BREAST BIOPSY Right 12/27/2022   [place] bx/ ribbon clip/ path pending   BREAST BIOPSY Right 12/27/2022   [place] RT BREAST BX W LOC DEV 1ST LESION IMG BX SPEC US GUIDE 12/27/2022 [facility]   BREAST LUMPECTOMY WITH SENTINEL LYMPH NODE BIOPSY Right 01/18/2023   Procedure: BREAST LUMPECTOMY WITH SENTINEL LYMPH NODE BX;  Surgeon: Campbell Lerner, MD;  Location: [facility] ORS;  Service:  [address], [address] [place], [place], [address] Phone: [phone]   Fax:  [phone]  Name: Felicia Butler MRN: [id number] Date of Birth: 12/14/1960  [facility] [address] [place], [place], [address] Phone: [phone]   Fax:  [phone]  Occupational Therapy Screen  Patient Details  Name: Felicia Butler MRN: [id number] Date of Birth: 1960-09-07 No data recorded  Encounter Date: 03/22/2023   OT End of Session - 03/22/23 1718     Visit Number 0             Past Medical History:  Diagnosis Date   (HFpEF) heart failure with preserved ejection fraction (HCC) 12/09/2021   a.) TTE 12/09/2021: EF >55%, no RWMAs, GLS -16.8%, mild LAE, mod pHTN (RVSP 52), G2DD   Anemia associated with chronic renal failure    Aortic atherosclerosis (HCC)    Arthritis    Atherosclerosis of iliac artery    Coronary artery disease    a.) MV 11/28/2019: no sig CAD, no ischemia; b.) MV 09/21/2021: no sig CAD, no ischemia   DDD (degenerative disc disease), lumbar    Dyspnea    ESRD (end stage renal disease) on dialysis [facility])    a.) started HD on M-W-F in 2018   FSGS (focal segmental glomerulosclerosis) 1987   a.) diagnosed by Bx in 1987   GERD (gastroesophageal reflux disease)    Gout    Heart murmur    Hyperlipidemia    Hypertension    Hypothyroidism    Invasive carcinoma of RIGHT breast (HCC) 12/27/2022   a.) CNB of RIGHT breast mass on 12/27/2022 --> invasive mammary carcinoma (cT1b, cN0, cM0); ER/PR (+), HER2/neu (-).   Prolonged QT interval 05/03/2022   a.) ECG 05/03/2022: QTc = 511 ms; b.) ECG 01/13/2023: QTc = 577 ms   Pulmonary HTN (HCC) 12/09/2021   a.) TTE 12/09/2021: RVSP 52 mmHg   Sleep apnea    TB lung, latent    a.) (+) PPD 09/2011. CXR (-). Declined INH due to potential for hepatotoxicity.   TIA (transient ischemic attack)    Umbilical hernia     Past Surgical History:  Procedure Laterality Date   A/V FISTULAGRAM N/A 10/10/2022   Procedure: A/V Fistulagram;  Surgeon: Annice Needy, MD;  Location: [facility];  Service: Cardiovascular;  Laterality:  N/A;   A/V FISTULAGRAM N/A 02/01/2022   Procedure: A/V Fistulagram;  Surgeon: Renford Dills, MD;  Location: [facility];  Service: Cardiovascular;  Laterality: N/A;   A/V FISTULAGRAM Left 11/08/2022   Procedure: A/V Fistulagram;  Surgeon: Renford Dills, MD;  Location: [facility];  Service: Cardiovascular;  Laterality: Left;   ABDOMINAL HYSTERECTOMY     AV FISTULA PLACEMENT Left 01/18/2017   Procedure: ARTERIOVENOUS (AV) FISTULA CREATION ( BRACHIAL CEPHALIC );  Surgeon: Renford Dills, MD;  Location: [facility];  Service: Vascular;  Laterality: Left;   BREAST BIOPSY Right 04/30/2013   intraductal papilloma   BREAST BIOPSY Right 04/30/2013   cyst aspiration   BREAST BIOPSY Right 05/27/2013   subareolar duct excision   BREAST BIOPSY Left 05/27/2013   subareolar duct excision   BREAST BIOPSY Left 08/11/2016   benign   BREAST BIOPSY Right 12/27/2022   [place] bx/ ribbon clip/ path pending   BREAST BIOPSY Right 12/27/2022   [place] RT BREAST BX W LOC DEV 1ST LESION IMG BX SPEC US GUIDE 12/27/2022 [facility]   BREAST LUMPECTOMY WITH SENTINEL LYMPH NODE BIOPSY Right 01/18/2023   Procedure: BREAST LUMPECTOMY WITH SENTINEL LYMPH NODE BX;  Surgeon: Campbell Lerner, MD;  Location: [facility];  Service:  [facility] [address] [place], [place], [address] Phone: [phone]   Fax:  [phone]  Occupational Therapy Screen  Patient Details  Name: Felicia Butler MRN: [id number] Date of Birth: 1960-09-07 No data recorded  Encounter Date: 03/22/2023   OT End of Session - 03/22/23 1718     Visit Number 0             Past Medical History:  Diagnosis Date   (HFpEF) heart failure with preserved ejection fraction (HCC) 12/09/2021   a.) TTE 12/09/2021: EF >55%, no RWMAs, GLS -16.8%, mild LAE, mod pHTN (RVSP 52), G2DD   Anemia associated with chronic renal failure    Aortic atherosclerosis (HCC)    Arthritis    Atherosclerosis of iliac artery    Coronary artery disease    a.) MV 11/28/2019: no sig CAD, no ischemia; b.) MV 09/21/2021: no sig CAD, no ischemia   DDD (degenerative disc disease), lumbar    Dyspnea    ESRD (end stage renal disease) on dialysis [facility])    a.) started HD on M-W-F in 2018   FSGS (focal segmental glomerulosclerosis) 1987   a.) diagnosed by Bx in 1987   GERD (gastroesophageal reflux disease)    Gout    Heart murmur    Hyperlipidemia    Hypertension    Hypothyroidism    Invasive carcinoma of RIGHT breast (HCC) 12/27/2022   a.) CNB of RIGHT breast mass on 12/27/2022 --> invasive mammary carcinoma (cT1b, cN0, cM0); ER/PR (+), HER2/neu (-).   Prolonged QT interval 05/03/2022   a.) ECG 05/03/2022: QTc = 511 ms; b.) ECG 01/13/2023: QTc = 577 ms   Pulmonary HTN (HCC) 12/09/2021   a.) TTE 12/09/2021: RVSP 52 mmHg   Sleep apnea    TB lung, latent    a.) (+) PPD 09/2011. CXR (-). Declined INH due to potential for hepatotoxicity.   TIA (transient ischemic attack)    Umbilical hernia     Past Surgical History:  Procedure Laterality Date   A/V FISTULAGRAM N/A 10/10/2022   Procedure: A/V Fistulagram;  Surgeon: Annice Needy, MD;  Location: [facility];  Service: Cardiovascular;  Laterality:  N/A;   A/V FISTULAGRAM N/A 02/01/2022   Procedure: A/V Fistulagram;  Surgeon: Renford Dills, MD;  Location: [facility];  Service: Cardiovascular;  Laterality: N/A;   A/V FISTULAGRAM Left 11/08/2022   Procedure: A/V Fistulagram;  Surgeon: Renford Dills, MD;  Location: [facility];  Service: Cardiovascular;  Laterality: Left;   ABDOMINAL HYSTERECTOMY     AV FISTULA PLACEMENT Left 01/18/2017   Procedure: ARTERIOVENOUS (AV) FISTULA CREATION ( BRACHIAL CEPHALIC );  Surgeon: Renford Dills, MD;  Location: [facility];  Service: Vascular;  Laterality: Left;   BREAST BIOPSY Right 04/30/2013   intraductal papilloma   BREAST BIOPSY Right 04/30/2013   cyst aspiration   BREAST BIOPSY Right 05/27/2013   subareolar duct excision   BREAST BIOPSY Left 05/27/2013   subareolar duct excision   BREAST BIOPSY Left 08/11/2016   benign   BREAST BIOPSY Right 12/27/2022   [place] bx/ ribbon clip/ path pending   BREAST BIOPSY Right 12/27/2022   [place] RT BREAST BX W LOC DEV 1ST LESION IMG BX SPEC US GUIDE 12/27/2022 [facility]   BREAST LUMPECTOMY WITH SENTINEL LYMPH NODE BIOPSY Right 01/18/2023   Procedure: BREAST LUMPECTOMY WITH SENTINEL LYMPH NODE BX;  Surgeon: Campbell Lerner, MD;  Location: [facility];  Service:

## 2023-03-23 ENCOUNTER — Other Ambulatory Visit: Payer: Self-pay | Admitting: Radiation Oncology

## 2023-03-23 DIAGNOSIS — C801 Malignant (primary) neoplasm, unspecified: Secondary | ICD-10-CM

## 2023-03-28 ENCOUNTER — Ambulatory Visit
Admission: RE | Admit: 2023-03-28 | Discharge: 2023-03-28 | Disposition: A | Discharge status: Home / Self Care | Payer: 59 | Source: Ambulatory Visit | Attending: Radiation Oncology | Admitting: Radiation Oncology

## 2023-03-28 ENCOUNTER — Encounter: Payer: Self-pay | Admitting: *Deleted

## 2023-03-28 DIAGNOSIS — C50411 Malignant neoplasm of upper-outer quadrant of right female breast: Secondary | ICD-10-CM | POA: Diagnosis present

## 2023-03-28 DIAGNOSIS — Z170 Estrogen receptor positive status [ER+]: Secondary | ICD-10-CM | POA: Insufficient documentation

## 2023-03-28 DIAGNOSIS — C801 Malignant (primary) neoplasm, unspecified: Secondary | ICD-10-CM

## 2023-03-31 ENCOUNTER — Other Ambulatory Visit: Payer: Self-pay | Admitting: *Deleted

## 2023-03-31 DIAGNOSIS — Z170 Estrogen receptor positive status [ER+]: Secondary | ICD-10-CM | POA: Diagnosis not present

## 2023-03-31 DIAGNOSIS — Z510 Encounter for antineoplastic radiation therapy: Secondary | ICD-10-CM | POA: Diagnosis present

## 2023-03-31 DIAGNOSIS — C50411 Malignant neoplasm of upper-outer quadrant of right female breast: Secondary | ICD-10-CM | POA: Diagnosis present

## 2023-04-04 ENCOUNTER — Ambulatory Visit
Admission: RE | Admit: 2023-04-04 | Discharge: 2023-04-04 | Disposition: A | Discharge status: Home / Self Care | Payer: 59 | Source: Ambulatory Visit | Attending: Radiation Oncology | Admitting: Radiation Oncology

## 2023-04-04 DIAGNOSIS — Z510 Encounter for antineoplastic radiation therapy: Secondary | ICD-10-CM | POA: Diagnosis not present

## 2023-04-05 ENCOUNTER — Ambulatory Visit
Admission: RE | Admit: 2023-04-05 | Discharge: 2023-04-05 | Disposition: A | Discharge status: Home / Self Care | Payer: 59 | Source: Ambulatory Visit | Attending: Radiation Oncology | Admitting: Radiation Oncology

## 2023-04-05 ENCOUNTER — Other Ambulatory Visit: Payer: Self-pay

## 2023-04-05 DIAGNOSIS — Z510 Encounter for antineoplastic radiation therapy: Secondary | ICD-10-CM | POA: Diagnosis not present

## 2023-04-05 LAB — RAD ONC ARIA SESSION SUMMARY
Course Elapsed Days: 0
Plan Fractions Treated to Date: 1
Plan Prescribed Dose Per Fraction: 2.66 Gy
Plan Total Fractions Prescribed: 16
Plan Total Prescribed Dose: 42.56 Gy
Reference Point Dosage Given to Date: 2.66 Gy
Reference Point Session Dosage Given: 2.66 Gy
Session Number: 1

## 2023-04-06 ENCOUNTER — Other Ambulatory Visit: Payer: Self-pay

## 2023-04-06 ENCOUNTER — Ambulatory Visit
Admission: RE | Admit: 2023-04-06 | Discharge: 2023-04-06 | Disposition: A | Discharge status: Home / Self Care | Payer: 59 | Source: Ambulatory Visit | Attending: Radiation Oncology | Admitting: Radiation Oncology

## 2023-04-06 DIAGNOSIS — Z510 Encounter for antineoplastic radiation therapy: Secondary | ICD-10-CM | POA: Diagnosis not present

## 2023-04-06 LAB — RAD ONC ARIA SESSION SUMMARY
Course Elapsed Days: 1
Plan Fractions Treated to Date: 2
Plan Prescribed Dose Per Fraction: 2.66 Gy
Plan Total Fractions Prescribed: 16
Plan Total Prescribed Dose: 42.56 Gy
Reference Point Dosage Given to Date: 5.32 Gy
Reference Point Session Dosage Given: 2.66 Gy
Session Number: 2

## 2023-04-07 ENCOUNTER — Other Ambulatory Visit: Payer: Self-pay

## 2023-04-07 ENCOUNTER — Ambulatory Visit
Admission: RE | Admit: 2023-04-07 | Discharge: 2023-04-07 | Disposition: A | Discharge status: Home / Self Care | Payer: 59 | Source: Ambulatory Visit | Attending: Radiation Oncology | Admitting: Radiation Oncology

## 2023-04-07 DIAGNOSIS — Z510 Encounter for antineoplastic radiation therapy: Secondary | ICD-10-CM | POA: Diagnosis not present

## 2023-04-07 LAB — RAD ONC ARIA SESSION SUMMARY
Course Elapsed Days: 2
Plan Fractions Treated to Date: 3
Plan Prescribed Dose Per Fraction: 2.66 Gy
Plan Total Fractions Prescribed: 16
Plan Total Prescribed Dose: 42.56 Gy
Reference Point Dosage Given to Date: 7.98 Gy
Reference Point Session Dosage Given: 2.66 Gy
Session Number: 3

## 2023-04-10 ENCOUNTER — Ambulatory Visit
Admission: RE | Admit: 2023-04-10 | Discharge: 2023-04-10 | Disposition: A | Discharge status: Home / Self Care | Payer: 59 | Source: Ambulatory Visit | Attending: Radiation Oncology | Admitting: Radiation Oncology

## 2023-04-10 ENCOUNTER — Other Ambulatory Visit: Payer: Self-pay

## 2023-04-10 DIAGNOSIS — Z510 Encounter for antineoplastic radiation therapy: Secondary | ICD-10-CM | POA: Diagnosis not present

## 2023-04-10 LAB — RAD ONC ARIA SESSION SUMMARY
Course Elapsed Days: 5
Plan Fractions Treated to Date: 4
Plan Prescribed Dose Per Fraction: 2.66 Gy
Plan Total Fractions Prescribed: 16
Plan Total Prescribed Dose: 42.56 Gy
Reference Point Dosage Given to Date: 10.64 Gy
Reference Point Session Dosage Given: 2.66 Gy
Session Number: 4

## 2023-04-11 ENCOUNTER — Ambulatory Visit
Admission: RE | Admit: 2023-04-11 | Discharge: 2023-04-11 | Disposition: A | Discharge status: Home / Self Care | Payer: 59 | Source: Ambulatory Visit | Attending: Radiation Oncology | Admitting: Radiation Oncology

## 2023-04-11 ENCOUNTER — Other Ambulatory Visit: Payer: Self-pay

## 2023-04-11 DIAGNOSIS — Z510 Encounter for antineoplastic radiation therapy: Secondary | ICD-10-CM | POA: Diagnosis not present

## 2023-04-11 LAB — RAD ONC ARIA SESSION SUMMARY
Course Elapsed Days: 6
Plan Fractions Treated to Date: 5
Plan Prescribed Dose Per Fraction: 2.66 Gy
Plan Total Fractions Prescribed: 16
Plan Total Prescribed Dose: 42.56 Gy
Reference Point Dosage Given to Date: 13.3 Gy
Reference Point Session Dosage Given: 2.66 Gy
Session Number: 5

## 2023-04-12 ENCOUNTER — Inpatient Hospital Stay: Payer: 59

## 2023-04-12 ENCOUNTER — Ambulatory Visit
Admission: RE | Admit: 2023-04-12 | Discharge: 2023-04-12 | Disposition: A | Discharge status: Home / Self Care | Payer: 59 | Source: Ambulatory Visit | Attending: Radiation Oncology | Admitting: Radiation Oncology

## 2023-04-12 ENCOUNTER — Other Ambulatory Visit: Payer: Self-pay

## 2023-04-12 DIAGNOSIS — C50411 Malignant neoplasm of upper-outer quadrant of right female breast: Secondary | ICD-10-CM | POA: Insufficient documentation

## 2023-04-12 DIAGNOSIS — Z510 Encounter for antineoplastic radiation therapy: Secondary | ICD-10-CM | POA: Diagnosis not present

## 2023-04-12 DIAGNOSIS — Z170 Estrogen receptor positive status [ER+]: Secondary | ICD-10-CM | POA: Insufficient documentation

## 2023-04-12 LAB — CBC (CANCER CENTER ONLY)
HCT: 31.6 % — ABNORMAL LOW (ref 36.0–46.0)
Hemoglobin: 10.5 g/dL — ABNORMAL LOW (ref 12.0–15.0)
MCH: 26.9 pg (ref 26.0–34.0)
MCHC: 33.2 g/dL (ref 30.0–36.0)
MCV: 81 fL (ref 80.0–100.0)
Platelet Count: 135 10*3/uL — ABNORMAL LOW (ref 150–400)
RBC: 3.9 MIL/uL (ref 3.87–5.11)
RDW: 17.7 % — ABNORMAL HIGH (ref 11.5–15.5)
WBC Count: 5.3 10*3/uL (ref 4.0–10.5)
nRBC: 0 % (ref 0.0–0.2)

## 2023-04-12 LAB — RAD ONC ARIA SESSION SUMMARY
Course Elapsed Days: 7
Plan Fractions Treated to Date: 6
Plan Prescribed Dose Per Fraction: 2.66 Gy
Plan Total Fractions Prescribed: 16
Plan Total Prescribed Dose: 42.56 Gy
Reference Point Dosage Given to Date: 15.96 Gy
Reference Point Session Dosage Given: 2.66 Gy
Session Number: 6

## 2023-04-13 ENCOUNTER — Other Ambulatory Visit: Payer: Self-pay

## 2023-04-13 ENCOUNTER — Ambulatory Visit
Admission: RE | Admit: 2023-04-13 | Discharge: 2023-04-13 | Disposition: A | Discharge status: Home / Self Care | Payer: 59 | Source: Ambulatory Visit | Attending: Radiation Oncology | Admitting: Radiation Oncology

## 2023-04-13 DIAGNOSIS — Z510 Encounter for antineoplastic radiation therapy: Secondary | ICD-10-CM | POA: Diagnosis not present

## 2023-04-13 LAB — RAD ONC ARIA SESSION SUMMARY
Course Elapsed Days: 8
Plan Fractions Treated to Date: 7
Plan Prescribed Dose Per Fraction: 2.66 Gy
Plan Total Fractions Prescribed: 16
Plan Total Prescribed Dose: 42.56 Gy
Reference Point Dosage Given to Date: 18.62 Gy
Reference Point Session Dosage Given: 2.66 Gy
Session Number: 7

## 2023-04-14 ENCOUNTER — Other Ambulatory Visit: Payer: Self-pay

## 2023-04-14 ENCOUNTER — Ambulatory Visit
Admission: RE | Admit: 2023-04-14 | Discharge: 2023-04-14 | Disposition: A | Discharge status: Home / Self Care | Payer: 59 | Source: Ambulatory Visit | Attending: Radiation Oncology | Admitting: Radiation Oncology

## 2023-04-14 DIAGNOSIS — Z510 Encounter for antineoplastic radiation therapy: Secondary | ICD-10-CM | POA: Diagnosis not present

## 2023-04-14 LAB — RAD ONC ARIA SESSION SUMMARY
Course Elapsed Days: 9
Plan Fractions Treated to Date: 8
Plan Prescribed Dose Per Fraction: 2.66 Gy
Plan Total Fractions Prescribed: 16
Plan Total Prescribed Dose: 42.56 Gy
Reference Point Dosage Given to Date: 21.28 Gy
Reference Point Session Dosage Given: 2.66 Gy
Session Number: 8

## 2023-04-17 ENCOUNTER — Other Ambulatory Visit: Payer: Self-pay

## 2023-04-17 ENCOUNTER — Ambulatory Visit
Admission: RE | Admit: 2023-04-17 | Discharge: 2023-04-17 | Disposition: A | Discharge status: Home / Self Care | Payer: 59 | Source: Ambulatory Visit | Attending: Radiation Oncology | Admitting: Radiation Oncology

## 2023-04-17 DIAGNOSIS — Z510 Encounter for antineoplastic radiation therapy: Secondary | ICD-10-CM | POA: Diagnosis not present

## 2023-04-17 LAB — RAD ONC ARIA SESSION SUMMARY
Course Elapsed Days: 12
Plan Fractions Treated to Date: 9
Plan Prescribed Dose Per Fraction: 2.66 Gy
Plan Total Fractions Prescribed: 16
Plan Total Prescribed Dose: 42.56 Gy
Reference Point Dosage Given to Date: 23.94 Gy
Reference Point Session Dosage Given: 2.66 Gy
Session Number: 9

## 2023-04-18 ENCOUNTER — Other Ambulatory Visit: Payer: Self-pay

## 2023-04-18 ENCOUNTER — Ambulatory Visit
Admission: RE | Admit: 2023-04-18 | Discharge: 2023-04-18 | Disposition: A | Discharge status: Home / Self Care | Payer: 59 | Source: Ambulatory Visit | Attending: Radiation Oncology | Admitting: Radiation Oncology

## 2023-04-18 DIAGNOSIS — Z510 Encounter for antineoplastic radiation therapy: Secondary | ICD-10-CM | POA: Diagnosis not present

## 2023-04-18 LAB — RAD ONC ARIA SESSION SUMMARY
Course Elapsed Days: 13
Plan Fractions Treated to Date: 10
Plan Prescribed Dose Per Fraction: 2.66 Gy
Plan Total Fractions Prescribed: 16
Plan Total Prescribed Dose: 42.56 Gy
Reference Point Dosage Given to Date: 26.6 Gy
Reference Point Session Dosage Given: 2.66 Gy
Session Number: 10

## 2023-04-19 ENCOUNTER — Ambulatory Visit
Admission: RE | Admit: 2023-04-19 | Discharge: 2023-04-19 | Disposition: A | Discharge status: Home / Self Care | Payer: 59 | Source: Ambulatory Visit | Attending: Radiation Oncology | Admitting: Radiation Oncology

## 2023-04-19 ENCOUNTER — Other Ambulatory Visit: Payer: Self-pay

## 2023-04-19 DIAGNOSIS — Z510 Encounter for antineoplastic radiation therapy: Secondary | ICD-10-CM | POA: Diagnosis not present

## 2023-04-19 LAB — RAD ONC ARIA SESSION SUMMARY
Course Elapsed Days: 14
Plan Fractions Treated to Date: 11
Plan Prescribed Dose Per Fraction: 2.66 Gy
Plan Total Fractions Prescribed: 16
Plan Total Prescribed Dose: 42.56 Gy
Reference Point Dosage Given to Date: 29.26 Gy
Reference Point Session Dosage Given: 2.66 Gy
Session Number: 11

## 2023-04-20 ENCOUNTER — Other Ambulatory Visit: Payer: Self-pay

## 2023-04-20 ENCOUNTER — Ambulatory Visit
Admission: RE | Admit: 2023-04-20 | Discharge: 2023-04-20 | Disposition: A | Discharge status: Home / Self Care | Payer: 59 | Source: Ambulatory Visit | Attending: Radiation Oncology | Admitting: Radiation Oncology

## 2023-04-20 DIAGNOSIS — Z510 Encounter for antineoplastic radiation therapy: Secondary | ICD-10-CM | POA: Diagnosis not present

## 2023-04-20 LAB — RAD ONC ARIA SESSION SUMMARY
Course Elapsed Days: 15
Plan Fractions Treated to Date: 12
Plan Prescribed Dose Per Fraction: 2.66 Gy
Plan Total Fractions Prescribed: 16
Plan Total Prescribed Dose: 42.56 Gy
Reference Point Dosage Given to Date: 31.92 Gy
Reference Point Session Dosage Given: 2.66 Gy
Session Number: 12

## 2023-04-21 ENCOUNTER — Ambulatory Visit
Admission: RE | Admit: 2023-04-21 | Discharge: 2023-04-21 | Disposition: A | Discharge status: Home / Self Care | Payer: 59 | Source: Ambulatory Visit | Attending: Radiation Oncology | Admitting: Radiation Oncology

## 2023-04-21 ENCOUNTER — Other Ambulatory Visit: Payer: Self-pay

## 2023-04-21 DIAGNOSIS — Z510 Encounter for antineoplastic radiation therapy: Secondary | ICD-10-CM | POA: Diagnosis not present

## 2023-04-21 LAB — RAD ONC ARIA SESSION SUMMARY
Course Elapsed Days: 16
Plan Fractions Treated to Date: 13
Plan Prescribed Dose Per Fraction: 2.66 Gy
Plan Total Fractions Prescribed: 16
Plan Total Prescribed Dose: 42.56 Gy
Reference Point Dosage Given to Date: 34.58 Gy
Reference Point Session Dosage Given: 2.66 Gy
Session Number: 13

## 2023-04-24 ENCOUNTER — Other Ambulatory Visit: Payer: Self-pay

## 2023-04-24 ENCOUNTER — Ambulatory Visit
Admission: RE | Admit: 2023-04-24 | Discharge: 2023-04-24 | Disposition: A | Discharge status: Home / Self Care | Payer: 59 | Source: Ambulatory Visit | Attending: Radiation Oncology | Admitting: Radiation Oncology

## 2023-04-24 DIAGNOSIS — Z510 Encounter for antineoplastic radiation therapy: Secondary | ICD-10-CM | POA: Diagnosis not present

## 2023-04-24 LAB — RAD ONC ARIA SESSION SUMMARY
Course Elapsed Days: 19
Plan Fractions Treated to Date: 14
Plan Prescribed Dose Per Fraction: 2.66 Gy
Plan Total Fractions Prescribed: 16
Plan Total Prescribed Dose: 42.56 Gy
Reference Point Dosage Given to Date: 37.24 Gy
Reference Point Session Dosage Given: 2.66 Gy
Session Number: 14

## 2023-04-25 ENCOUNTER — Ambulatory Visit
Admission: RE | Admit: 2023-04-25 | Discharge: 2023-04-25 | Disposition: A | Discharge status: Home / Self Care | Payer: 59 | Source: Ambulatory Visit | Attending: Radiation Oncology | Admitting: Radiation Oncology

## 2023-04-25 ENCOUNTER — Other Ambulatory Visit: Payer: Self-pay

## 2023-04-25 DIAGNOSIS — Z510 Encounter for antineoplastic radiation therapy: Secondary | ICD-10-CM | POA: Diagnosis not present

## 2023-04-25 LAB — RAD ONC ARIA SESSION SUMMARY
Course Elapsed Days: 20
Plan Fractions Treated to Date: 15
Plan Prescribed Dose Per Fraction: 2.66 Gy
Plan Total Fractions Prescribed: 16
Plan Total Prescribed Dose: 42.56 Gy
Reference Point Dosage Given to Date: 39.9 Gy
Reference Point Session Dosage Given: 2.66 Gy
Session Number: 15

## 2023-04-26 ENCOUNTER — Inpatient Hospital Stay: Payer: 59

## 2023-04-26 ENCOUNTER — Other Ambulatory Visit: Payer: Self-pay

## 2023-04-26 ENCOUNTER — Ambulatory Visit
Admission: RE | Admit: 2023-04-26 | Discharge: 2023-04-26 | Disposition: A | Discharge status: Home / Self Care | Payer: 59 | Source: Ambulatory Visit | Attending: Radiation Oncology | Admitting: Radiation Oncology

## 2023-04-26 ENCOUNTER — Ambulatory Visit: Admission: RE | Admit: 2023-04-26 | Payer: 59 | Source: Ambulatory Visit

## 2023-04-26 DIAGNOSIS — C50411 Malignant neoplasm of upper-outer quadrant of right female breast: Secondary | ICD-10-CM

## 2023-04-26 DIAGNOSIS — Z510 Encounter for antineoplastic radiation therapy: Secondary | ICD-10-CM | POA: Diagnosis not present

## 2023-04-26 LAB — RAD ONC ARIA SESSION SUMMARY
Course Elapsed Days: 21
Plan Fractions Treated to Date: 16
Plan Prescribed Dose Per Fraction: 2.66 Gy
Plan Total Fractions Prescribed: 16
Plan Total Prescribed Dose: 42.56 Gy
Reference Point Dosage Given to Date: 42.56 Gy
Reference Point Session Dosage Given: 2.66 Gy
Session Number: 16

## 2023-04-26 LAB — CBC (CANCER CENTER ONLY)
HCT: 31.1 % — ABNORMAL LOW (ref 36.0–46.0)
Hemoglobin: 10 g/dL — ABNORMAL LOW (ref 12.0–15.0)
MCH: 26.9 pg (ref 26.0–34.0)
MCHC: 32.2 g/dL (ref 30.0–36.0)
MCV: 83.6 fL (ref 80.0–100.0)
Platelet Count: 144 10*3/uL — ABNORMAL LOW (ref 150–400)
RBC: 3.72 MIL/uL — ABNORMAL LOW (ref 3.87–5.11)
RDW: 18.3 % — ABNORMAL HIGH (ref 11.5–15.5)
WBC Count: 4.9 10*3/uL (ref 4.0–10.5)
nRBC: 0 % (ref 0.0–0.2)

## 2023-05-01 ENCOUNTER — Other Ambulatory Visit: Payer: Self-pay

## 2023-05-01 ENCOUNTER — Ambulatory Visit
Admission: RE | Admit: 2023-05-01 | Discharge: 2023-05-01 | Disposition: A | Discharge status: Home / Self Care | Payer: 59 | Source: Ambulatory Visit | Attending: Radiation Oncology | Admitting: Radiation Oncology

## 2023-05-01 DIAGNOSIS — Z170 Estrogen receptor positive status [ER+]: Secondary | ICD-10-CM | POA: Diagnosis not present

## 2023-05-01 DIAGNOSIS — C50411 Malignant neoplasm of upper-outer quadrant of right female breast: Secondary | ICD-10-CM | POA: Insufficient documentation

## 2023-05-01 DIAGNOSIS — Z510 Encounter for antineoplastic radiation therapy: Secondary | ICD-10-CM | POA: Insufficient documentation

## 2023-05-01 LAB — RAD ONC ARIA SESSION SUMMARY
Course Elapsed Days: 26
Plan Fractions Treated to Date: 1
Plan Prescribed Dose Per Fraction: 2 Gy
Plan Total Fractions Prescribed: 8
Plan Total Prescribed Dose: 16 Gy
Reference Point Dosage Given to Date: 2 Gy
Reference Point Session Dosage Given: 2 Gy
Session Number: 17

## 2023-05-02 ENCOUNTER — Other Ambulatory Visit: Payer: Self-pay

## 2023-05-02 ENCOUNTER — Ambulatory Visit
Admission: RE | Admit: 2023-05-02 | Discharge: 2023-05-02 | Disposition: A | Discharge status: Home / Self Care | Payer: 59 | Source: Ambulatory Visit | Attending: Radiation Oncology | Admitting: Radiation Oncology

## 2023-05-02 DIAGNOSIS — Z510 Encounter for antineoplastic radiation therapy: Secondary | ICD-10-CM | POA: Diagnosis not present

## 2023-05-02 LAB — RAD ONC ARIA SESSION SUMMARY
Course Elapsed Days: 27
Plan Fractions Treated to Date: 2
Plan Prescribed Dose Per Fraction: 2 Gy
Plan Total Fractions Prescribed: 8
Plan Total Prescribed Dose: 16 Gy
Reference Point Dosage Given to Date: 4 Gy
Reference Point Session Dosage Given: 2 Gy
Session Number: 18

## 2023-05-03 ENCOUNTER — Inpatient Hospital Stay: Payer: 59 | Admitting: Occupational Therapy

## 2023-05-03 ENCOUNTER — Ambulatory Visit
Admission: RE | Admit: 2023-05-03 | Discharge: 2023-05-03 | Disposition: A | Discharge status: Home / Self Care | Payer: 59 | Source: Ambulatory Visit | Attending: Radiation Oncology | Admitting: Radiation Oncology

## 2023-05-03 ENCOUNTER — Other Ambulatory Visit: Payer: Self-pay

## 2023-05-03 DIAGNOSIS — Z510 Encounter for antineoplastic radiation therapy: Secondary | ICD-10-CM | POA: Diagnosis not present

## 2023-05-03 DIAGNOSIS — N6489 Other specified disorders of breast: Secondary | ICD-10-CM

## 2023-05-03 DIAGNOSIS — Z170 Estrogen receptor positive status [ER+]: Secondary | ICD-10-CM | POA: Insufficient documentation

## 2023-05-03 DIAGNOSIS — C50411 Malignant neoplasm of upper-outer quadrant of right female breast: Secondary | ICD-10-CM | POA: Insufficient documentation

## 2023-05-03 LAB — RAD ONC ARIA SESSION SUMMARY
Course Elapsed Days: 28
Plan Fractions Treated to Date: 3
Plan Prescribed Dose Per Fraction: 2 Gy
Plan Total Fractions Prescribed: 8
Plan Total Prescribed Dose: 16 Gy
Reference Point Dosage Given to Date: 6 Gy
Reference Point Session Dosage Given: 2 Gy
Session Number: 19

## 2023-05-03 NOTE — Therapy (Signed)
[facility] - A Dept Of . [facility] [address], [address] [place], [place], [address] Phone: [phone]   Fax:  [phone]  Occupational Therapy Screen  Patient Details  Name: Felicia Butler MRN: [id number] Date of Birth: 1960-07-18 No data recorded  Encounter Date: 05/03/2023   OT End of Session - 05/03/23 1339     Visit Number 0             Past Medical History:  Diagnosis Date   (HFpEF) heart failure with preserved ejection fraction (HCC) 12/09/2021   a.) TTE 12/09/2021: EF >55%, no RWMAs, GLS -16.8%, mild LAE, mod pHTN (RVSP 52), G2DD   Anemia associated with chronic renal failure    Aortic atherosclerosis (HCC)    Arthritis    Atherosclerosis of iliac artery    Coronary artery disease    a.) MV 11/28/2019: no sig CAD, no ischemia; b.) MV 09/21/2021: no sig CAD, no ischemia   DDD (degenerative disc disease), lumbar    Dyspnea    ESRD (end stage renal disease) on dialysis [facility])    a.) started HD on M-W-F in 2018   FSGS (focal segmental glomerulosclerosis) 1987   a.) diagnosed by Bx in 1987   GERD (gastroesophageal reflux disease)    Gout    Heart murmur    Hyperlipidemia    Hypertension    Hypothyroidism    Invasive carcinoma of RIGHT breast (HCC) 12/27/2022   a.) CNB of RIGHT breast mass on 12/27/2022 --> invasive mammary carcinoma (cT1b, cN0, cM0); ER/PR (+), HER2/neu (-).   Prolonged QT interval 05/03/2022   a.) ECG 05/03/2022: QTc = 511 ms; b.) ECG 01/13/2023: QTc = 577 ms   Pulmonary HTN (HCC) 12/09/2021   a.) TTE 12/09/2021: RVSP 52 mmHg   Sleep apnea    TB lung, latent    a.) (+) PPD 09/2011. CXR (-). Declined INH due to potential for hepatotoxicity.   TIA (transient ischemic attack)    Umbilical hernia     Past Surgical History:  Procedure Laterality Date   A/V FISTULAGRAM N/A 10/10/2022   Procedure: A/V Fistulagram;  Surgeon: Annice Needy, MD;  Location: [facility];  Service:  Cardiovascular;  Laterality: N/A;   A/V FISTULAGRAM N/A 02/01/2022   Procedure: A/V Fistulagram;  Surgeon: Renford Dills, MD;  Location: [facility];  Service: Cardiovascular;  Laterality: N/A;   A/V FISTULAGRAM Left 11/08/2022   Procedure: A/V Fistulagram;  Surgeon: Renford Dills, MD;  Location: [facility];  Service: Cardiovascular;  Laterality: Left;   ABDOMINAL HYSTERECTOMY     AV FISTULA PLACEMENT Left 01/18/2017   Procedure: ARTERIOVENOUS (AV) FISTULA CREATION ( BRACHIAL CEPHALIC );  Surgeon: Renford Dills, MD;  Location: [facility];  Service: Vascular;  Laterality: Left;   BREAST BIOPSY Right 04/30/2013   intraductal papilloma   BREAST BIOPSY Right 04/30/2013   cyst aspiration   BREAST BIOPSY Right 05/27/2013   subareolar duct excision   BREAST BIOPSY Left 05/27/2013   subareolar duct excision   BREAST BIOPSY Left 08/11/2016   benign   BREAST BIOPSY Right 12/27/2022   [place] bx/ ribbon clip/ path pending   BREAST BIOPSY Right 12/27/2022   [place] RT BREAST BX W LOC DEV 1ST LESION IMG BX SPEC US GUIDE 12/27/2022 [facility]   BREAST LUMPECTOMY WITH SENTINEL LYMPH NODE BIOPSY Right 01/18/2023   Procedure: BREAST LUMPECTOMY WITH SENTINEL LYMPH NODE BX;  Surgeon: Claudine Mouton,  Katherina Right, MD;  Location: [facility];  Service: General;  Laterality: Right;   COLONOSCOPY  06/28/2018   DIALYSIS/PERMA CATHETER INSERTION N/A 02/01/2022   Procedure: DIALYSIS/PERMA CATHETER INSERTION;  Surgeon: Renford Dills, MD;  Location: [facility];  Service: Cardiovascular;  Laterality: N/A;   DIALYSIS/PERMA CATHETER REMOVAL N/A 07/28/2022   Procedure: DIALYSIS/PERMA CATHETER REMOVAL;  Surgeon: Annice Needy, MD;  Location: [facility];  Service: Cardiovascular;  Laterality: N/A;   EVACUATION BREAST HEMATOMA Right 03/06/2023   Procedure: EVACUATION HEMATOMA BREAST, seroma;  Surgeon: Campbell Lerner, MD;  Location: [facility];  Service: General;  Laterality: Right;    HERNIA REPAIR  1960's   PARTIAL HYSTERECTOMY     1980's   PERIPHERAL VASCULAR THROMBECTOMY Left 04/18/2017   Procedure: PERIPHERAL VASCULAR THROMBECTOMY;  Surgeon: Renford Dills, MD;  Location: [facility];  Service: Cardiovascular;  Laterality: Left;   REVISION OF ARTERIOVENOUS GORETEX GRAFT Left 10/09/2022   Procedure: REVISION OF ARTERIOVENOUS FISTULA;  Surgeon: Nada Libman, MD;  Location: [facility];  Service: Vascular;  Laterality: Left;   REVISON OF ARTERIOVENOUS FISTULA Left 05/06/2022   Procedure: REVISON OF ARTERIOVENOUS FISTULA (BRACHIAL CEPHALIC);  Surgeon: Renford Dills, MD;  Location: [facility];  Service: Vascular;  Laterality: Left;    There were no vitals filed for this visit.   Subjective Assessment - 05/03/23 1339     Subjective  I have 4 more sessions of radiation - doing okay - breast still softer and smaller - some days harder than others - but getting tender    Currently in Pain? No/denies                 LYMPHEDEMA/ONCOLOGY QUESTIONNAIRE - 05/03/23 0001       Right Upper Extremity Lymphedema   15 cm Proximal to Olecranon Process 25.5 cm    Olecranon Process 20.5 cm    15 cm Proximal to Ulnar Styloid Process 21.2 cm    Just Proximal to Ulnar Styloid Process 15.2 cm      Left Upper Extremity Lymphedema   15 cm Proximal to Olecranon Process 25.5 cm    Olecranon Process 21 cm    15 cm Proximal to Ulnar Styloid Process 20.4 cm    Just Proximal to Ulnar Styloid Process 14.4 cm              OT SCREEN 02/22/23:  Patient had right lumpectomy on 01/18/2023 by Dr. Claudine Mouton. Bilateral upper extremity range of motion within normal limits and strength within functional limits. Circumference of bilateral upper extremity taken and within normal limits. Patient lives with sister and son lives with them.  Patient is right-hand dominant.  Patient on disability. Patient has a fistula on the left-getting dialysis 3 times a week. Patient has  a follow-up with surgeon tomorrow to drain seroma. Possible start for radiation after seroma is drained in 2 to 3 weeks. Right breast heavier and fuller but patient denies pain. 31 cm from posterior to sternum. Patient to follow-up with me again in 2 weeks.   Dr Claudine Mouton 03/14/23: Assessment/Plan: This is a 63 y.o. female s/p drain placement for postoperative hematoma/seroma 03/06/23.  Skin: The right breast still has significant edematous changes and swelling.  I cannot appreciate a fluid collection consistent with a recurrent seroma or hematoma.  It is still significantly larger compared to the left.  The drain site under the right breast is clean and dry.  I milked the drain but could not  get any additional output.  I proceeded with drain removal since it is no longer functional.  Dry dressing applied.  Caryl Lyn present as chaperone      OT SCREEN 03/15/23:  Pt return for follow up - pt had drain put in R breast hematoma/seroma - drain pulledl yesterday.  The right breast still with significant edematous changes and swelling.  R breast still significantly larger compared to the left.  Bandage in place at drain site under the right breast is clean and dry.  Pt seen Dr Aggie Cosier and mapping for radiation 03/28/23 - pt report she is wearing sports bra for compression but did not wear today- breast still from posterior to sternum 37 cm  Pt was ed on soft tissue massage to decrease fibrosis on R breast followed by MLD from R AAA to L axilla To do 2-3 min on breast fibrotic tech and MLD to follow 20 reps - light and slow  And follow up in week       OT SCREEN 03/22/23:   Pt return for follow up after being seen last week  - pt had drain put in R breast hematoma/seroma few wks ago - drain pulled was pulled 03/14/23 The right breast  was still significant edematous with swelling.  R breast  was also still significantly larger compared to the left.   Pt return this date after doing some soft tissue  massage to decrease fibrosis on R breast followed by MLD from R AAA to L axilla since last week Also coming in with compression bra- and show great progress in fibrosis -and tenderness and swelling Breast decrease from 37 cm last week to 30 cm across posterior to sternum. Done some soft tissue to scar and fibrosis and great results- pt to cont at home To do 2-3 min on breast fibrotic tech and MLD to follow 20 reps - light and slow R to L AAA Shoulder AROM in all planes WNL and painfree Pt to follow up 1/2 way thru radiation    OT SCREEN 05/03/23:   Pt return for follow up after not been seen for 5-6 wks-  pt had drain put in R breast hematoma/seroma  in Oct - drain was pulled pulled 03/14/23 In Oct she done some soft tissue massage to decrease fibrosis on R breast followed by MLD from R AAA to L axilla  Also used compression bra- and show great progress in fibrosis -and tenderness and swelling Breast decrease from 37 cm last week to 30 cm across posterior to sternum in end of Oct This date return - she started radiation and has 4 more session left -  And Breast decrease to 27 cm - - great progress from [facility]. Pt to hold off on any soft tissue to scar and fibrosis until about 4-6 wks after last session of radiation Circumference of UE WNL compare to L Pt to follow up with me around middle January to check on fibrosis and scar tissue on R breast                               Visit Diagnosis: Seroma of breast    Problem List Patient Active Problem List   Diagnosis Date Noted   Goals of care, counseling/discussion 02/07/2023   Seroma of breast 01/26/2023   Genetic testing 01/23/2023   Invasive carcinoma of breast (HCC) 01/03/2023   Family history of cancer 01/03/2023   Complication  of vascular graft 10/09/2022   Hypercalcemia 06/03/2022   Chronic kidney disease (CKD), stage III (moderate) (HCC) 06/03/2022   Hypertension 06/03/2022   Allergy, unspecified,  initial encounter 01/30/2022   Anaphylactic shock, unspecified, initial encounter 01/30/2022   Mixed hyperlipidemia 01/06/2021   Contact with and (suspected) exposure to covid-19 11/06/2020   Encounter for screening for COVID-19 11/06/2020   CAD in native artery 09/10/2020   Complication of vascular access for dialysis 08/19/2020   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   Gout 02/27/2020   Hyperlipidemia 02/27/2020   Hypothyroidism 02/27/2020   Obesity 02/27/2020   TB lung, 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   Other disorders of phosphorus metabolism 11/07/2018   COVID-19 10/15/2018   Hyperkalemia 08/08/2018   Anemia in chronic kidney disease 04/10/2017   Coagulation defect, unspecified (HCC) 04/10/2017   Diarrhea, unspecified 04/10/2017   Encounter for immunization 04/10/2017   End stage renal disease (HCC) 04/10/2017   Fever, unspecified 04/10/2017   Headache, unspecified 04/10/2017   Iron deficiency anemia, unspecified 04/10/2017   Moderate protein-calorie malnutrition (HCC) 04/10/2017   Pain, unspecified 04/10/2017   Pruritus, unspecified 04/10/2017   Secondary hyperparathyroidism of renal origin (HCC) 04/10/2017   Shortness of breath 04/10/2017   Type 2 diabetes mellitus with diabetic chronic kidney disease (HCC) 04/10/2017   Hypertensive chronic kidney disease with stage 1 through stage 4 chronic kidney disease, or unspecified chronic kidney disease 04/10/2017   Chronic renal insufficiency 12/18/2016   Essential hypertension 12/18/2016   Intraductal papilloma of breast 05/16/2013   Proteinuria 10/07/2012    Oletta Cohn, OTR/L,CLT 05/03/2023, 1:40 PM  [facility] - [facility] Dept Of [place]. [facility] [address], [address] [place], [place], [address] Phone: [phone]   Fax:  [phone]  Name: Felicia Butler MRN: [id number] Date of Birth: 1961/01/28

## 2023-05-04 ENCOUNTER — Other Ambulatory Visit: Payer: Self-pay

## 2023-05-04 ENCOUNTER — Ambulatory Visit
Admission: RE | Admit: 2023-05-04 | Discharge: 2023-05-04 | Disposition: A | Discharge status: Home / Self Care | Payer: 59 | Source: Ambulatory Visit | Attending: Radiation Oncology | Admitting: Radiation Oncology

## 2023-05-04 DIAGNOSIS — Z510 Encounter for antineoplastic radiation therapy: Secondary | ICD-10-CM | POA: Diagnosis not present

## 2023-05-04 LAB — RAD ONC ARIA SESSION SUMMARY
Course Elapsed Days: 29
Plan Fractions Treated to Date: 4
Plan Prescribed Dose Per Fraction: 2 Gy
Plan Total Fractions Prescribed: 8
Plan Total Prescribed Dose: 16 Gy
Reference Point Dosage Given to Date: 8 Gy
Reference Point Session Dosage Given: 2 Gy
Session Number: 20

## 2023-05-05 ENCOUNTER — Other Ambulatory Visit: Payer: Self-pay

## 2023-05-05 ENCOUNTER — Ambulatory Visit
Admission: RE | Admit: 2023-05-05 | Discharge: 2023-05-05 | Disposition: A | Discharge status: Home / Self Care | Payer: 59 | Source: Ambulatory Visit | Attending: Radiation Oncology | Admitting: Radiation Oncology

## 2023-05-05 DIAGNOSIS — Z510 Encounter for antineoplastic radiation therapy: Secondary | ICD-10-CM | POA: Diagnosis not present

## 2023-05-05 LAB — RAD ONC ARIA SESSION SUMMARY
Course Elapsed Days: 30
Plan Fractions Treated to Date: 5
Plan Prescribed Dose Per Fraction: 2 Gy
Plan Total Fractions Prescribed: 8
Plan Total Prescribed Dose: 16 Gy
Reference Point Dosage Given to Date: 10 Gy
Reference Point Session Dosage Given: 2 Gy
Session Number: 21

## 2023-05-08 ENCOUNTER — Other Ambulatory Visit: Payer: Self-pay

## 2023-05-08 ENCOUNTER — Ambulatory Visit
Admission: RE | Admit: 2023-05-08 | Discharge: 2023-05-08 | Disposition: A | Discharge status: Home / Self Care | Payer: 59 | Source: Ambulatory Visit | Attending: Radiation Oncology | Admitting: Radiation Oncology

## 2023-05-08 DIAGNOSIS — Z510 Encounter for antineoplastic radiation therapy: Secondary | ICD-10-CM | POA: Diagnosis not present

## 2023-05-08 LAB — RAD ONC ARIA SESSION SUMMARY
Course Elapsed Days: 33
Plan Fractions Treated to Date: 6
Plan Prescribed Dose Per Fraction: 2 Gy
Plan Total Fractions Prescribed: 8
Plan Total Prescribed Dose: 16 Gy
Reference Point Dosage Given to Date: 12 Gy
Reference Point Session Dosage Given: 2 Gy
Session Number: 22

## 2023-05-09 ENCOUNTER — Other Ambulatory Visit: Payer: Self-pay

## 2023-05-09 ENCOUNTER — Ambulatory Visit
Admission: RE | Admit: 2023-05-09 | Discharge: 2023-05-09 | Disposition: A | Discharge status: Home / Self Care | Payer: 59 | Source: Ambulatory Visit | Attending: Radiation Oncology | Admitting: Radiation Oncology

## 2023-05-09 DIAGNOSIS — Z510 Encounter for antineoplastic radiation therapy: Secondary | ICD-10-CM | POA: Diagnosis not present

## 2023-05-09 LAB — RAD ONC ARIA SESSION SUMMARY
Course Elapsed Days: 34
Plan Fractions Treated to Date: 7
Plan Prescribed Dose Per Fraction: 2 Gy
Plan Total Fractions Prescribed: 8
Plan Total Prescribed Dose: 16 Gy
Reference Point Dosage Given to Date: 14 Gy
Reference Point Session Dosage Given: 2 Gy
Session Number: 23

## 2023-05-10 ENCOUNTER — Inpatient Hospital Stay: Payer: 59

## 2023-05-10 ENCOUNTER — Ambulatory Visit
Admission: RE | Admit: 2023-05-10 | Discharge: 2023-05-10 | Disposition: A | Discharge status: Home / Self Care | Payer: 59 | Source: Ambulatory Visit | Attending: Radiation Oncology | Admitting: Radiation Oncology

## 2023-05-10 ENCOUNTER — Encounter: Payer: Self-pay | Admitting: *Deleted

## 2023-05-10 ENCOUNTER — Other Ambulatory Visit: Payer: Self-pay

## 2023-05-10 DIAGNOSIS — Z510 Encounter for antineoplastic radiation therapy: Secondary | ICD-10-CM | POA: Diagnosis not present

## 2023-05-10 DIAGNOSIS — C50411 Malignant neoplasm of upper-outer quadrant of right female breast: Secondary | ICD-10-CM

## 2023-05-10 LAB — RAD ONC ARIA SESSION SUMMARY
Course Elapsed Days: 35
Plan Fractions Treated to Date: 8
Plan Prescribed Dose Per Fraction: 2 Gy
Plan Total Fractions Prescribed: 8
Plan Total Prescribed Dose: 16 Gy
Reference Point Dosage Given to Date: 16 Gy
Reference Point Session Dosage Given: 2 Gy
Session Number: 24

## 2023-05-10 LAB — CBC (CANCER CENTER ONLY)
HCT: 29.5 % — ABNORMAL LOW (ref 36.0–46.0)
Hemoglobin: 9.7 g/dL — ABNORMAL LOW (ref 12.0–15.0)
MCH: 27.3 pg (ref 26.0–34.0)
MCHC: 32.9 g/dL (ref 30.0–36.0)
MCV: 83.1 fL (ref 80.0–100.0)
Platelet Count: 143 10*3/uL — ABNORMAL LOW (ref 150–400)
RBC: 3.55 MIL/uL — ABNORMAL LOW (ref 3.87–5.11)
RDW: 18.6 % — ABNORMAL HIGH (ref 11.5–15.5)
WBC Count: 5.2 10*3/uL (ref 4.0–10.5)
nRBC: 0 % (ref 0.0–0.2)

## 2023-05-11 NOTE — Radiation Completion Notes (Signed)
Patient Name: Felicia Butler, Felicia Butler MRN: [id number] Date of Birth: 1960/07/08 Referring Physician: Ina Homes, M.D. Date of Service: 2023-05-11 Radiation Oncologist: Carmina Miller, M.D. [facility] [place]                             RADIATION ONCOLOGY END OF TREATMENT NOTE     Diagnosis: C50.411 Malignant neoplasm of upper-outer quadrant of right female breast Staging on 2023-01-03: Invasive carcinoma of breast (HCC) T=cT1b, N=cN0, M=cM0 Intent: Curative     HPI: Patient is a 62 year old female originally seen back in September for stage Ia (pT1c pN0 M0) ER/PR positive invasive mammary carcinoma.  She developed a large seroma which has recently been drained she just had the catheter removed yesterday.  She is doing well still somewhat tender..      ==========DELIVERED PLANS==========  First Treatment Date: 2023-04-05 Last Treatment Date: 2023-05-10   Plan Name: Breast_R Site: Breast, Right Technique: 3D Mode: Photon Dose Per Fraction: 2.66 Gy Prescribed Dose (Delivered / Prescribed): 42.56 Gy / 42.56 Gy Prescribed Fxs (Delivered / Prescribed): 16 / 16   Plan Name: Breast_Rt_Bst Site: Breast, Right Technique: 3D Mode: Photon Dose Per Fraction: 2 Gy Prescribed Dose (Delivered / Prescribed): 16 Gy / 16 Gy Prescribed Fxs (Delivered / Prescribed): 8 / 8     ==========ON TREATMENT VISIT DATES========== 2023-04-11, 2023-04-18, 2023-04-25, 2023-05-02, 2023-05-09     ==========UPCOMING VISITS========== 06/21/2023 [facility] SCREEN Oletta Cohn, OT  06/12/2023 [facility] RAD ONCOLOGY FOLLOW UP 30 Carmina Miller, MD  06/08/2023 [facility] DIALYSIS ACCESS [facility] V[facility] 3  06/08/2023 [facility] FOLLOW UP VASCULAR Schnier, Latina Craver, MD  05/30/2023 [facility] ON[facility] EST PT 15 Rickard Patience, MD  05/18/2023 [facility] OFFICE VISIT Debbe Odea, MD        ==========APPENDIX - ON TREATMENT VISIT NOTES==========    See weekly On Treatment Notes in Epic for details in the Media tab (listed as Progress notes on the On Treatment Visit Dates listed above).

## 2023-05-18 ENCOUNTER — Ambulatory Visit: Payer: 59 | Attending: Cardiology | Admitting: Cardiology

## 2023-05-19 ENCOUNTER — Encounter: Payer: Self-pay | Admitting: Cardiology

## 2023-05-30 ENCOUNTER — Encounter: Payer: Self-pay | Admitting: Oncology

## 2023-05-30 ENCOUNTER — Inpatient Hospital Stay: Payer: 59 | Admitting: Oncology

## 2023-06-05 ENCOUNTER — Inpatient Hospital Stay: Payer: 59

## 2023-06-05 ENCOUNTER — Encounter: Payer: Self-pay | Admitting: Oncology

## 2023-06-05 ENCOUNTER — Inpatient Hospital Stay: Payer: 59 | Attending: Oncology | Admitting: Oncology

## 2023-06-05 VITALS — BP 164/63 | HR 61 | Temp 96.9°F | Resp 18 | Wt 136.0 lb

## 2023-06-05 DIAGNOSIS — D631 Anemia in chronic kidney disease: Secondary | ICD-10-CM | POA: Insufficient documentation

## 2023-06-05 DIAGNOSIS — Z79811 Long term (current) use of aromatase inhibitors: Secondary | ICD-10-CM | POA: Insufficient documentation

## 2023-06-05 DIAGNOSIS — C50919 Malignant neoplasm of unspecified site of unspecified female breast: Secondary | ICD-10-CM | POA: Diagnosis not present

## 2023-06-05 DIAGNOSIS — N186 End stage renal disease: Secondary | ICD-10-CM | POA: Diagnosis not present

## 2023-06-05 DIAGNOSIS — Z1732 Human epidermal growth factor receptor 2 negative status: Secondary | ICD-10-CM | POA: Diagnosis not present

## 2023-06-05 DIAGNOSIS — Z1721 Progesterone receptor positive status: Secondary | ICD-10-CM | POA: Insufficient documentation

## 2023-06-05 DIAGNOSIS — Z87891 Personal history of nicotine dependence: Secondary | ICD-10-CM | POA: Insufficient documentation

## 2023-06-05 DIAGNOSIS — Z79899 Other long term (current) drug therapy: Secondary | ICD-10-CM | POA: Diagnosis not present

## 2023-06-05 DIAGNOSIS — Z992 Dependence on renal dialysis: Secondary | ICD-10-CM | POA: Insufficient documentation

## 2023-06-05 DIAGNOSIS — Z170 Estrogen receptor positive status [ER+]: Secondary | ICD-10-CM | POA: Insufficient documentation

## 2023-06-05 DIAGNOSIS — C50411 Malignant neoplasm of upper-outer quadrant of right female breast: Secondary | ICD-10-CM | POA: Insufficient documentation

## 2023-06-05 LAB — CBC WITH DIFFERENTIAL (CANCER CENTER ONLY)
Abs Immature Granulocytes: 0.02 10*3/uL (ref 0.00–0.07)
Basophils Absolute: 0 10*3/uL (ref 0.0–0.1)
Basophils Relative: 1 %
Eosinophils Absolute: 0.1 10*3/uL (ref 0.0–0.5)
Eosinophils Relative: 2 %
HCT: 26.1 % — ABNORMAL LOW (ref 36.0–46.0)
Hemoglobin: 8.5 g/dL — ABNORMAL LOW (ref 12.0–15.0)
Immature Granulocytes: 1 %
Lymphocytes Relative: 14 %
Lymphs Abs: 0.6 10*3/uL — ABNORMAL LOW (ref 0.7–4.0)
MCH: 28 pg (ref 26.0–34.0)
MCHC: 32.6 g/dL (ref 30.0–36.0)
MCV: 85.9 fL (ref 80.0–100.0)
Monocytes Absolute: 0.4 10*3/uL (ref 0.1–1.0)
Monocytes Relative: 11 %
Neutro Abs: 2.9 10*3/uL (ref 1.7–7.7)
Neutrophils Relative %: 71 %
Platelet Count: 154 10*3/uL (ref 150–400)
RBC: 3.04 MIL/uL — ABNORMAL LOW (ref 3.87–5.11)
RDW: 18.6 % — ABNORMAL HIGH (ref 11.5–15.5)
WBC Count: 4 10*3/uL (ref 4.0–10.5)
nRBC: 0 % (ref 0.0–0.2)

## 2023-06-05 MED ORDER — ANASTROZOLE 1 MG PO TABS: 1.0000 mg | ORAL_TABLET | Freq: Every day | ORAL | 3 refills | Status: DC

## 2023-06-05 NOTE — Assessment & Plan Note (Addendum)
 Hemoglobin has further decreased. CBC showed Hb of 8.5  Likely due to recent radiation.  Will fax cbc result to her nephrologist who manages her iron and EPO

## 2023-06-05 NOTE — Assessment & Plan Note (Addendum)
 Pathology was discussed with the patient, pT1c pN0  ER+ PR+ HER2 negative Oncotype Dx recurrence score 3, no benefit of chemotherapy.  S/p djuvant radiation - she has appt with Radonc Recommend Adjuvant antiestrogen therapy, she is post menopausal 03/21/23 DEXA normal Rationale of using aromatase inhibitor -Arimidex   discussed with patient.  Side effects of Arimidex  including but not limited to hot flash, muscle and joint pain, fatigue, mood swing, vaginal dryness/inching, loss of bone mineral density,hair thinning, heart problem, etc were discussed with patient.  Patient voices understanding and willing to proceed.

## 2023-06-05 NOTE — Progress Notes (Signed)
 Hematology/Oncology Progress note Telephone:([phone] Fax:([phone]) [phone]      CHIEF COMPLAINTS/PURPOSE OF CONSULTATION:  Right breast invasive carcinoma, ER PR positive, HER2 negative  ASSESSMENT & PLAN:   Cancer Staging  Invasive carcinoma of breast (HCC) Staging form: Breast, AJCC 8th Edition - Clinical stage from 01/03/2023: cT1b, cN0, cM0, ER+, PR+, HER2- - Signed by Babara Call, MD on 01/03/2023   Invasive carcinoma of breast [facility]) Pathology was discussed with the patient, pT1c pN0  ER+ PR+ HER2 negative Oncotype Dx recurrence score 3, no benefit of chemotherapy.  S/p djuvant radiation - she has appt with Radonc Recommend Adjuvant antiestrogen therapy, she is post menopausal 03/21/23 DEXA normal Rationale of using aromatase inhibitor -Arimidex   discussed with patient.  Side effects of Arimidex  including but not limited to hot flash, muscle and joint pain, fatigue, mood swing, vaginal dryness/inching, loss of bone mineral density,hair thinning, heart problem, etc were discussed with patient.  Patient voices understanding and willing to proceed.     Anemia in chronic kidney disease Hemoglobin has further decreased. CBC showed Hb of 8.5  Likely due to recent radiation.  Will fax cbc result to her nephrologist who manages her iron  and EPO   Orders Placed This Encounter  Procedures   CBC with Differential (Cancer Center Only)    Standing Status:   Future    Number of Occurrences:   1    Expected Date:   06/05/2023    Expiration Date:   06/04/2024   CBC with Differential (Cancer Center Only)    Standing Status:   Future    Expected Date:   09/03/2023    Expiration Date:   06/04/2024   CMP (Cancer Center only)    Standing Status:   Future    Expected Date:   09/03/2023    Expiration Date:   06/04/2024   Follow-up 3 months All questions were answered. The patient knows to call the clinic with any problems, questions or concerns.  [name], MD, PhD [facility] Hematology  Oncology 06/05/2023    HISTORY OF PRESENTING ILLNESS:  Felicia Butler 63 y.o. female presents to establish care for right breast cancer I have reviewed her chart and materials related to her cancer extensively and collaborated history with the patient. Summary of oncologic history is as follows: Oncology History  Invasive carcinoma of breast (HCC)  12/07/2022 Mammogram   Bilateral screening mammogram showed possible right breast distortion   12/13/2022 Mammogram   Unilateral right diagnostic mammogram showed right breast 10 mm irregular mass in the 10 o'clock position suspicious for malignancy.  Right axillary ultrasound was performed and demonstrated morphologically benign lymph nodes.  No right axillary lymphadenopathy.   12/27/2022 Initial Diagnosis   Invasive carcinoma of breast (HCC)  Breast, right, needle core biopsy, 10 ocl, 7cmfn, mass - INVASIVE MAMMARY CARCINOMA WITH TUBULAR FEATURES, SEE NOTE - DUCTAL CARCINOMA IN SITU (DCIS): PRESENT, FOCAL, GRADE 2 - TUBULE FORMATION: SCORE 1 - NUCLEAR PLEOMORPHISM: SCORE 2 - MITOTIC COUNT: SCORE 1 - TOTAL SCORE: 4 - OVERALL GRADE: 1 - LYMPHOVASCULAR INVASION: NOT IDENTIFIED - CANCER LENGTH: 0.6 CM - CALCIFICATIONS: PRESENT, FOCAL  ER 100%+, PR 100%, HER2 negative.  (1+)   01/03/2023 Cancer Staging   Staging form: Breast, AJCC 8th Edition - Clinical stage from 01/03/2023: cT1b, cN0, cM0, ER+, PR+, HER2- - Signed by Babara Call, MD on 01/03/2023 Stage prefix: Initial diagnosis    Genetic Testing   Negative genetic testing. No pathogenic variants identified on the [facility] Common Hereditary  Cancers+RNA panel. The report date is 01/18/2023.  The Common Hereditary Cancers Panel + RNA offered by [facility] includes sequencing and/or deletion duplication testing of the following 48 genes: APC*, ATM*, AXIN2, BAP1, BARD1, BMPR1A, BRCA1, BRCA2, BRIP1, CDH1, CDK4, CDKN2A (p14ARF), CDKN2A (p16INK4a), CHEK2, CTNNA1, DICER1*, EPCAM*, FH*, GREM1*, HOXB13, KIT,  MBD4, MEN1*, MLH1*, MSH2*, MSH3*, MSH6*, MUTYH, NF1*, NTHL1, PALB2, PDGFRA, PMS2*, POLD1*, POLE, PTEN*, RAD51C, RAD51D, SDHA*, SDHB, SDHC*, SDHD, SMAD4, SMARCA4, STK11, TP53, TSC1*, TSC2, VHL.    01/18/2023 Surgery   Pt underwent right lumpectomy and SLNB  1. 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 - SEE CANCER SUMMARY BELOW. - BIOPSY SITE CHANGE WITH CLIP. - INTRADUCTAL PAPILLOMA WITH FLORID DUCTAL HYPERPLASIA, COLUMNAR CELL CHANGE, CYST FORMATION, AND ATYPICAL LOBULAR HYPERPLASIA. 2. Lymph node, sentinel, biopsy, #1 Right - ONE LYMPH NODE NEGATIVE FOR MALIGNANCY (0/1). 3. Lymph node, sentinel, biopsy, #2 Right - ONE LYMPH NODE NEGATIVE FOR MALIGNANCY (0/1). 4. Lymph node, sentinel, biopsy, #3 Right - ONE LYMPH NODE NEGATIVE FOR MALIGNANCY (0/1).   02/06/2023 Oncotype testing   Oncotype Dx 3, distant recurrence risk at 9 years 3%, benefit from chemotherapy <1%    Menarche at age of 62 First live birth at age of 16 OCP use: Less than 5 years History of hysterectomy: 43s. Menopausal status: Probably postmenopausal History of HRT use: No History of chest radiation: No Number of previous breast biopsies: 2014 bilateral breast biopsy, papillomas. Family history: No breast cancer.  Mother endometrial cancer, maternal cousin pancreatic cancer  Patient has ESRD on hemodialysis. Today she presents for follow up. S/p right breast lumpectomy and SLNB + s/p radiation.  She feels well today. No new complaints.    MEDICAL HISTORY:  Past Medical History:  Diagnosis Date   (HFpEF) heart failure with preserved ejection fraction (HCC) 12/09/2021   a.) TTE 12/09/2021: EF >55%, no RWMAs, GLS -16.8%, mild LAE, mod pHTN (RVSP 52), G2DD   Anemia associated with chronic renal failure    Aortic atherosclerosis (HCC)    Arthritis    Atherosclerosis of iliac artery    Coronary artery disease    a.) MV 11/28/2019: no sig CAD, no ischemia;  b.) MV 09/21/2021: no sig CAD, no ischemia   DDD (degenerative disc disease), lumbar    Dyspnea    ESRD (end stage renal disease) on dialysis [facility])    a.) started HD on M-W-F in 2018   FSGS (focal segmental glomerulosclerosis) 1987   a.) diagnosed by Bx in 1987   GERD (gastroesophageal reflux disease)    Gout    Heart murmur    Hyperlipidemia    Hypertension    Hypothyroidism    Invasive carcinoma of RIGHT breast (HCC) 12/27/2022   a.) CNB of RIGHT breast mass on 12/27/2022 --> invasive mammary carcinoma (cT1b, cN0, cM0); ER/PR (+), HER2/neu (-).   Prolonged QT interval 05/03/2022   a.) ECG 05/03/2022: QTc = 511 ms; b.) ECG 01/13/2023: QTc = 577 ms   Pulmonary HTN (HCC) 12/09/2021   a.) TTE 12/09/2021: RVSP 52 mmHg   Sleep apnea    TB lung, latent    a.) (+) PPD 09/2011. CXR (-). Declined INH due to potential for hepatotoxicity.   TIA (transient ischemic attack)    Umbilical hernia     SURGICAL HISTORY: Past Surgical History:  Procedure Laterality Date   A/V FISTULAGRAM N/A 10/10/2022   Procedure: A/V Fistulagram;  Surgeon: Marea Selinda RAMAN, MD;  Location: [facility] INVASIVE CV LAB;  Service: Cardiovascular;  Laterality: N/A;   A/V FISTULAGRAM N/A 02/01/2022   Procedure: A/V Fistulagram;  Surgeon: Jama Cordella MATSU, MD;  Location: [facility] INVASIVE CV LAB;  Service: Cardiovascular;  Laterality: N/A;   A/V FISTULAGRAM Left 11/08/2022   Procedure: A/V Fistulagram;  Surgeon: Jama Cordella MATSU, MD;  Location: [facility]AS[facility] CV [facility];  Service: Cardiovascular;  Laterality: Left;   ABDOMINAL HYSTERECTOMY     AV FISTULA PLACEMENT Left 01/18/2017   Procedure: ARTERIOVENOUS (AV) FISTULA CREATION ( BRACHIAL CEPHALIC );  Surgeon: Jama Cordella MATSU, MD;  Location: [facility] ORS;  Service: Vascular;  Laterality: Left;   BREAST BIOPSY Right 04/30/2013   intraductal papilloma   BREAST BIOPSY Right 04/30/2013   cyst aspiration   BREAST BIOPSY Right 05/27/2013   subareolar duct excision   BREAST BIOPSY Left  05/27/2013   subareolar duct excision   BREAST BIOPSY Left 08/11/2016   benign   BREAST BIOPSY Right 12/27/2022   us  bx/ ribbon clip/ path pending   BREAST BIOPSY Right 12/27/2022   US  RT BREAST BX W LOC DEV 1ST LESION IMG BX SPEC US  GUIDE 12/27/2022 [facility]   BREAST LUMPECTOMY WITH SENTINEL LYMPH NODE BIOPSY Right 01/18/2023   Procedure: BREAST LUMPECTOMY WITH SENTINEL LYMPH NODE BX;  Surgeon: Lane Shope, MD;  Location: [facility] ORS;  Service: General;  Laterality: Right;   COLONOSCOPY  06/28/2018   DIALYSIS/PERMA CATHETER INSERTION N/A 02/01/2022   Procedure: DIALYSIS/PERMA CATHETER INSERTION;  Surgeon: Jama Cordella MATSU, MD;  Location: [facility];  Service: Cardiovascular;  Laterality: N/A;   DIALYSIS/PERMA CATHETER REMOVAL N/A 07/28/2022   Procedure: DIALYSIS/PERMA CATHETER REMOVAL;  Surgeon: Marea Selinda RAMAN, MD;  Location: [facility];  Service: Cardiovascular;  Laterality: N/A;   EVACUATION BREAST HEMATOMA Right 03/06/2023   Procedure: EVACUATION HEMATOMA BREAST, seroma;  Surgeon: Lane Shope, MD;  Location: [facility];  Service: General;  Laterality: Right;   HERNIA REPAIR  1960's   PARTIAL HYSTERECTOMY     1980's   PERIPHERAL VASCULAR THROMBECTOMY Left 04/18/2017   Procedure: PERIPHERAL VASCULAR THROMBECTOMY;  Surgeon: Jama Cordella MATSU, MD;  Location: [facility];  Service: Cardiovascular;  Laterality: Left;   REVISION OF ARTERIOVENOUS GORETEX GRAFT Left 10/09/2022   Procedure: REVISION OF ARTERIOVENOUS FISTULA;  Surgeon: Serene Gaile ORN, MD;  Location: [facility];  Service: Vascular;  Laterality: Left;   REVISON OF ARTERIOVENOUS FISTULA Left 05/06/2022   Procedure: REVISON OF ARTERIOVENOUS FISTULA (BRACHIAL CEPHALIC);  Surgeon: Jama Cordella MATSU, MD;  Location: [facility] OR[facility];  Service: Vascular;  Laterality: Left;    SOCIAL HISTORY: Social History   Socioeconomic History   Marital status: Divorced    Spouse name: Not on file   Number of  children: 1   Years of education: Not on file   Highest education level: Not on file  Occupational History   Not on file  Tobacco Use   Smoking status: Former    Current packs/day: 0.00    Types: Cigarettes    Start date: 05/30/1978    Quit date: 05/31/1983    Years since quitting: 40.0    Passive exposure: Past   Smokeless tobacco: Never  Vaping Use   Vaping status: Never Used  Substance and Sexual Activity   Alcohol use: No   Drug use: No   Sexual activity: Not on file  Other Topics Concern   Not on file  Social History Narrative   Lives with sister and son   Social Drivers of Dispensing Optician  Resource Strain: Low Risk  (06/05/2023)   Overall Financial Resource Strain (CARDIA)    Difficulty of Paying Living Expenses: Not very hard  Food Insecurity: No Food Insecurity (01/03/2023)   Hunger Vital Sign    Worried About Running Out of Food in the Last Year: Never true    Ran Out of Food in the Last Year: Never true  Transportation Needs: No Transportation Needs (01/03/2023)   PRAPARE - Administrator, Civil Service (Medical): No    Lack of Transportation (Non-Medical): No  Recent Concern: Transportation Needs - Unmet Transportation Needs (10/09/2022)   PRAPARE - Administrator, Civil Service (Medical): Yes    Lack of Transportation (Non-Medical): Yes  Physical Activity: Not on file  Stress: Not on file  Social Connections: Not on file  Intimate Partner Violence: Not At Risk (01/03/2023)   Humiliation, Afraid, Rape, and Kick questionnaire    Fear of Current or Ex-Partner: No    Emotionally Abused: No    Physically Abused: No    Sexually Abused: No    FAMILY HISTORY: Family History  Problem Relation Age of Onset   Uterine cancer Mother 30   Hypertension Father    Diabetes Father    Stroke Father    Kidney failure Father    Cancer Maternal Grandmother        unk type   Pancreatic cancer Cousin    Pancreatic cancer Cousin    Breast cancer Neg Hx      ALLERGIES:  has no known allergies.  MEDICATIONS:  Current Outpatient Medications  Medication Sig Dispense Refill   acetaminophen  (TYLENOL ) 500 MG tablet Take 1,000 mg by mouth every 6 (six) hours as needed.     amLODipine  (NORVASC ) 10 MG tablet Take 10 mg by mouth daily.     anastrozole  (ARIMIDEX ) 1 MG tablet Take 1 tablet (1 mg total) by mouth daily. 30 tablet 3   atorvastatin  (LIPITOR) 80 MG tablet Take 80 mg by mouth at bedtime.     AURYXIA  1 GM 210 MG(Fe) tablet Take 630 mg by mouth 3 (three) times daily.     cinacalcet  (SENSIPAR ) 30 MG tablet Take 30 mg by mouth daily.     famotidine  (PEPCID ) 20 MG tablet Take 1 tablet (20 mg total) by mouth daily. 30 tablet 3   hydrALAZINE  (APRESOLINE ) 50 MG tablet Take 50 mg by mouth 3 (three) times daily.     levothyroxine  (SYNTHROID , LEVOTHROID) 75 MCG tablet Take 75 mcg by mouth daily before breakfast.     losartan  (COZAAR ) 100 MG tablet Take 100 mg by mouth daily.     Methoxy PEG-Epoetin Beta (MIRCERA IJ) Mircera     olmesartan (BENICAR) 40 MG tablet Take by mouth.     oxyCODONE  (OXY IR/ROXICODONE ) 5 MG immediate release tablet Take 1 tablet (5 mg total) by mouth every 6 (six) hours as needed for severe pain. 15 tablet 0   spironolactone (ALDACTONE) 25 MG tablet Take by mouth.     No current facility-administered medications for this visit.    Review of Systems  Constitutional:  Positive for fatigue. Negative for appetite change, chills and fever.  HENT:   Negative for hearing loss and voice change.   Eyes:  Negative for eye problems.  Respiratory:  Negative for chest tightness and cough.   Cardiovascular:  Negative for chest pain.  Gastrointestinal:  Negative for abdominal distention, abdominal pain and blood in stool.  Endocrine: Negative for hot flashes.  Genitourinary:  Negative for difficulty urinating and frequency.   Musculoskeletal:  Negative for arthralgias.  Skin:  Negative for itching and rash.  Neurological:  Negative  for extremity weakness.  Hematological:  Negative for adenopathy.  Psychiatric/Behavioral:  Negative for confusion.      PHYSICAL EXAMINATION: ECOG PERFORMANCE STATUS: 0 - Asymptomatic  Vitals:   06/05/23 1103 06/05/23 1111  BP: (!) 163/68 (!) 164/63  Pulse: 61   Resp: 18   Temp: (!) 96.9 F (36.1 C)   SpO2: 100%    Filed Weights   06/05/23 1103  Weight: 136 lb (61.7 kg)    Physical Exam Constitutional:      General: She is not in acute distress.    Appearance: She is not diaphoretic.  HENT:     Head: Normocephalic and atraumatic.  Eyes:     General: No scleral icterus. Cardiovascular:     Rate and Rhythm: Normal rate.  Pulmonary:     Effort: Pulmonary effort is normal. No respiratory distress.  Abdominal:     General: There is no distension.  Musculoskeletal:        General: Normal range of motion.     Cervical back: Normal range of motion and neck supple.  Skin:    Findings: No erythema.  Neurological:     Mental Status: She is alert and oriented to person, place, and time. Mental status is at baseline.     Motor: No abnormal muscle tone.  Psychiatric:        Mood and Affect: Mood and affect normal.     LABORATORY DATA:  I have reviewed the data as listed    Latest Ref Rng & Units 06/05/2023   11:45 AM 05/10/2023    1:02 PM 04/26/2023    1:34 PM  CBC  WBC 4.0 - 10.5 K/uL 4.0  5.2  4.9   Hemoglobin 12.0 - 15.0 g/dL 8.5  9.7  89.9   Hematocrit 36.0 - 46.0 % 26.1  29.5  31.1   Platelets 150 - 400 K/uL 154  143  144       Latest Ref Rng & Units 03/06/2023   11:25 AM 01/18/2023    9:54 AM 01/03/2023   11:56 AM  CMP  Glucose 70 - 99 mg/dL 90  83  75   BUN 8 - 23 mg/dL 35  35  49   Creatinine 0.44 - 1.00 mg/dL 0.19  1.59  3.19   Sodium 135 - 145 mmol/L 137  139  138   Potassium 3.5 - 5.1 mmol/L 3.0  3.2  3.9   Chloride 98 - 111 mmol/L 98  99  95   CO2 22 - 32 mmol/L   29   Calcium  8.9 - 10.3 mg/dL   8.6   Total Protein 6.5 - 8.1 g/dL   7.8   Total  Bilirubin 0.3 - 1.2 mg/dL   0.9   Alkaline Phos 38 - 126 U/L   213   AST 15 - 41 U/L   25   ALT 0 - 44 U/L   16      RADIOGRAPHIC STUDIES: I have personally reviewed the radiological images as listed and agreed with the findings in the report. No results found.

## 2023-06-06 ENCOUNTER — Encounter: Payer: Self-pay | Admitting: *Deleted

## 2023-06-06 ENCOUNTER — Telehealth: Payer: Self-pay

## 2023-06-06 NOTE — Telephone Encounter (Signed)
-----   Message from Rickard Patience sent at 06/05/2023  8:11 PM EST ----- Please send her result to nephrologist.

## 2023-06-06 NOTE — Telephone Encounter (Signed)
 Labs faxed to [facility].

## 2023-06-07 NOTE — Progress Notes (Deleted)
 MRN : [id number]  Felicia Butler is a 64 y.o. (1961-02-08) female who presents with chief complaint of check access.  History of Present Illness:   The patient returns to the office for follow up regarding a problem with their dialysis access.   The patient notes a significant increase in problems with dialysis.  It is reported that adequate dialysis is not being achieved.    The patient denies hand pain or other symptoms consistent with steal phenomena.  No significant arm swelling.  The patient denies redness or swelling at the access site. The patient denies fever or chills at home or while on dialysis.  No recent shortening of the patient's walking distance or new symptoms consistent with claudication.  No history of rest pain symptoms. No new ulcers or wounds of the lower extremities have occurred.  The patient denies amaurosis fugax or recent TIA symptoms. There are no recent neurological changes noted. There is no history of DVT, PE or superficial thrombophlebitis. No recent episodes of angina or shortness of breath documented.   Duplex ultrasound of the AV access shows a patent access.  The previously noted stenosis is significantly increased compared to last study.  Flow volume today is *** cc/min (previous flow volume was *** cc/min)  No outpatient medications have been marked as taking for the 06/08/23 encounter (Appointment) with Jama, Cordella MATSU, MD.    Past Medical History:  Diagnosis Date   (HFpEF) heart failure with preserved ejection fraction (HCC) 12/09/2021   a.) TTE 12/09/2021: EF >55%, no RWMAs, GLS -16.8%, mild LAE, mod pHTN (RVSP 52), G2DD   Anemia associated with chronic renal failure    Aortic atherosclerosis (HCC)    Arthritis    Atherosclerosis of iliac artery    Coronary artery disease    a.) MV 11/28/2019: no sig CAD, no ischemia; b.) MV 09/21/2021: no sig CAD, no ischemia   DDD (degenerative disc disease), lumbar    Dyspnea     ESRD (end stage renal disease) on dialysis [facility])    a.) started HD on M-W-F in 2018   FSGS (focal segmental glomerulosclerosis) 1987   a.) diagnosed by Bx in 1987   GERD (gastroesophageal reflux disease)    Gout    Heart murmur    Hyperlipidemia    Hypertension    Hypothyroidism    Invasive carcinoma of RIGHT breast (HCC) 12/27/2022   a.) CNB of RIGHT breast mass on 12/27/2022 --> invasive mammary carcinoma (cT1b, cN0, cM0); ER/PR (+), HER2/neu (-).   Prolonged QT interval 05/03/2022   a.) ECG 05/03/2022: QTc = 511 ms; b.) ECG 01/13/2023: QTc = 577 ms   Pulmonary HTN (HCC) 12/09/2021   a.) TTE 12/09/2021: RVSP 52 mmHg   Sleep apnea    TB lung, latent    a.) (+) PPD 09/2011. CXR (-). Declined INH due to potential for hepatotoxicity.   TIA (transient ischemic attack)    Umbilical hernia     Past Surgical History:  Procedure Laterality Date   A/V FISTULAGRAM N/A 10/10/2022   Procedure: A/V Fistulagram;  Surgeon: Marea Selinda RAMAN, MD;  Location: [facility];  Service: Cardiovascular;  Laterality: N/A;   A/V FISTULAGRAM N/A 02/01/2022   Procedure: A/V Fistulagram;  Surgeon: Jama Cordella MATSU, MD;  Location: [facility];  Service: Cardiovascular;  Laterality: N/A;   A/V FISTULAGRAM Left 11/08/2022   Procedure:  A/V Fistulagram;  Surgeon: Jama Cordella MATSU, MD;  Location: [facility];  Service: Cardiovascular;  Laterality: Left;   ABDOMINAL HYSTERECTOMY     AV FISTULA PLACEMENT Left 01/18/2017   Procedure: ARTERIOVENOUS (AV) FISTULA CREATION ( BRACHIAL CEPHALIC );  Surgeon: Jama Cordella MATSU, MD;  Location: [facility] ORS;  Service: Vascular;  Laterality: Left;   BREAST BIOPSY Right 04/30/2013   intraductal papilloma   BREAST BIOPSY Right 04/30/2013   cyst aspiration   BREAST BIOPSY Right 05/27/2013   subareolar duct excision   BREAST BIOPSY Left 05/27/2013   subareolar duct excision   BREAST BIOPSY Left 08/11/2016   benign   BREAST BIOPSY Right 12/27/2022   us   bx/ ribbon clip/ path pending   BREAST BIOPSY Right 12/27/2022   US  RT BREAST BX W LOC DEV 1ST LESION IMG BX SPEC US  GUIDE 12/27/2022 [facility]   BREAST LUMPECTOMY WITH SENTINEL LYMPH NODE BIOPSY Right 01/18/2023   Procedure: BREAST LUMPECTOMY WITH SENTINEL LYMPH NODE BX;  Surgeon: Lane Shope, MD;  Location: [facility];  Service: General;  Laterality: Right;   COLONOSCOPY  06/28/2018   DIALYSIS/PERMA CATHETER INSERTION N/A 02/01/2022   Procedure: DIALYSIS/PERMA CATHETER INSERTION;  Surgeon: Jama Cordella MATSU, MD;  Location: [facility];  Service: Cardiovascular;  Laterality: N/A;   DIALYSIS/PERMA CATHETER REMOVAL N/A 07/28/2022   Procedure: DIALYSIS/PERMA CATHETER REMOVAL;  Surgeon: Marea Selinda RAMAN, MD;  Location: [facility];  Service: Cardiovascular;  Laterality: N/A;   EVACUATION BREAST HEMATOMA Right 03/06/2023   Procedure: EVACUATION HEMATOMA BREAST, seroma;  Surgeon: Lane Shope, MD;  Location: [facility];  Service: General;  Laterality: Right;   HERNIA REPAIR  1960's   PARTIAL HYSTERECTOMY     1980's   PERIPHERAL VASCULAR THROMBECTOMY Left 04/18/2017   Procedure: PERIPHERAL VASCULAR THROMBECTOMY;  Surgeon: Jama Cordella MATSU, MD;  Location: [facility];  Service: Cardiovascular;  Laterality: Left;   REVISION OF ARTERIOVENOUS GORETEX GRAFT Left 10/09/2022   Procedure: REVISION OF ARTERIOVENOUS FISTULA;  Surgeon: Serene Gaile ORN, MD;  Location: [facility];  Service: Vascular;  Laterality: Left;   REVISON OF ARTERIOVENOUS FISTULA Left 05/06/2022   Procedure: REVISON OF ARTERIOVENOUS FISTULA (BRACHIAL CEPHALIC);  Surgeon: Jama Cordella MATSU, MD;  Location: [facility];  Service: Vascular;  Laterality: Left;    Social History Social History   Tobacco Use   Smoking status: Former    Current packs/day: 0.00    Types: Cigarettes    Start date: 05/30/1978    Quit date: 05/31/1983    Years since quitting: 40.0    Passive exposure: Past   Smokeless tobacco:  Never  Vaping Use   Vaping status: Never Used  Substance Use Topics   Alcohol use: No   Drug use: No    Family History Family History  Problem Relation Age of Onset   Uterine cancer Mother 23   Hypertension Father    Diabetes Father    Stroke Father    Kidney failure Father    Cancer Maternal Grandmother        unk type   Pancreatic cancer Cousin    Pancreatic cancer Cousin    Breast cancer Neg Hx     No Known Allergies   REVIEW OF SYSTEMS (Negative unless checked)  Constitutional: [] Weight loss  [] Fever  [] Chills Cardiac: [] Chest pain   [] Chest pressure   [] Palpitations   [] Shortness of breath when laying flat   [] Shortness of breath with exertion. Vascular:  [] Pain in legs with  walking   [] Pain in legs at rest  [] History of DVT   [] Phlebitis   [] Swelling in legs   [] Varicose veins   [] Non-healing ulcers Pulmonary:   [] Uses home oxygen   [] Productive cough   [] Hemoptysis   [] Wheeze  [] COPD   [] Asthma Neurologic:  [] Dizziness   [] Seizures   [] History of stroke   [] History of TIA  [] Aphasia   [] Vissual changes   [] Weakness or numbness in arm   [] Weakness or numbness in leg Musculoskeletal:   [] Joint swelling   [] Joint pain   [] Low back pain Hematologic:  [] Easy bruising  [] Easy bleeding   [] Hypercoagulable state   [] Anemic Gastrointestinal:  [] Diarrhea   [] Vomiting  [] Gastroesophageal reflux/heartburn   [] Difficulty swallowing. Genitourinary:  [x] Chronic kidney disease   [] Difficult urination  [] Frequent urination   [] Blood in urine Skin:  [] Rashes   [] Ulcers  Psychological:  [] History of anxiety   []  History of major depression.  Physical Examination  There were no vitals filed for this visit. There is no height or weight on file to calculate BMI. Gen: WD/WN, NAD Head: [place]/AT, No temporalis wasting.  Ear/Nose/Throat: Hearing grossly intact, nares w/o erythema or drainage Eyes: PER, EOMI, sclera nonicteric.  Neck: Supple, no gross masses or lesions.  No JVD.  Pulmonary:   Good air movement, no audible wheezing, no use of accessory muscles.  Cardiac: RRR, precordium non-hyperdynamic. Vascular:   *** Vessel Right Left  Radial Palpable Palpable  Brachial Palpable Palpable  Gastrointestinal: soft, non-distended. No guarding/no peritoneal signs.  Musculoskeletal: M/S 5/5 throughout.  No deformity.  Neurologic: CN 2-12 intact. Pain and light touch intact in extremities.  Symmetrical.  Speech is fluent. Motor exam as listed above. Psychiatric: Judgment intact, Mood & affect appropriate for pt's clinical situation. Dermatologic: No rashes or ulcers noted.  No changes consistent with cellulitis.   CBC Lab Results  Component Value Date   WBC 4.0 06/05/2023   HGB 8.5 (L) 06/05/2023   HCT 26.1 (L) 06/05/2023   MCV 85.9 06/05/2023   PLT 154 06/05/2023    BMET    Component Value Date/Time   NA 137 03/06/2023 1125   NA 138 06/21/2013 0048   K 3.0 (L) 03/06/2023 1125   K 4.1 06/21/2013 0048   CL 98 03/06/2023 1125   CL 108 (H) 06/21/2013 0048   CO2 29 01/03/2023 1156   CO2 22 06/21/2013 0048   GLUCOSE 90 03/06/2023 1125   GLUCOSE 122 (H) 06/21/2013 0048   BUN 35 (H) 03/06/2023 1125   BUN 44 (H) 06/21/2013 0048   CREATININE 9.80 (H) 03/06/2023 1125   CREATININE 3.33 (H) 06/21/2013 0048   CALCIUM  8.6 (L) 01/03/2023 1156   CALCIUM  9.6 06/21/2013 0048   GFRNONAA 6 (L) 01/03/2023 1156   GFRNONAA 15 (L) 06/21/2013 0048   GFRAA 5 (L) 01/10/2017 0828   GFRAA 17 (L) 06/21/2013 0048   CrCl cannot be calculated (Patient's most recent lab result is older than the maximum 21 days allowed.).  COAG Lab Results  Component Value Date   INR 0.95 01/10/2017    Radiology No results found.   Assessment/Plan There are no diagnoses linked to this encounter.   Cordella Shawl, MD  06/07/2023 7:58 AM

## 2023-06-08 ENCOUNTER — Ambulatory Visit (INDEPENDENT_AMBULATORY_CARE_PROVIDER_SITE_OTHER): Payer: 59 | Admitting: Vascular Surgery

## 2023-06-08 ENCOUNTER — Ambulatory Visit (INDEPENDENT_AMBULATORY_CARE_PROVIDER_SITE_OTHER): Payer: 59

## 2023-06-08 DIAGNOSIS — N186 End stage renal disease: Secondary | ICD-10-CM | POA: Diagnosis not present

## 2023-06-08 DIAGNOSIS — I10 Essential (primary) hypertension: Secondary | ICD-10-CM

## 2023-06-08 DIAGNOSIS — E782 Mixed hyperlipidemia: Secondary | ICD-10-CM

## 2023-06-08 DIAGNOSIS — I2510 Atherosclerotic heart disease of native coronary artery without angina pectoris: Secondary | ICD-10-CM

## 2023-06-08 DIAGNOSIS — T829XXS Unspecified complication of cardiac and vascular prosthetic device, implant and graft, sequela: Secondary | ICD-10-CM

## 2023-06-12 ENCOUNTER — Encounter: Payer: Self-pay | Admitting: Radiation Oncology

## 2023-06-12 ENCOUNTER — Ambulatory Visit
Admission: RE | Admit: 2023-06-12 | Discharge: 2023-06-12 | Disposition: A | Discharge status: Home / Self Care | Payer: 59 | Source: Ambulatory Visit | Attending: Radiation Oncology | Admitting: Radiation Oncology

## 2023-06-12 VITALS — BP 188/75 | HR 73 | Temp 98.0°F | Wt 132.0 lb

## 2023-06-12 DIAGNOSIS — C50411 Malignant neoplasm of upper-outer quadrant of right female breast: Secondary | ICD-10-CM | POA: Diagnosis present

## 2023-06-12 NOTE — Progress Notes (Signed)
 Radiation Oncology Follow up Note  Name: Felicia Butler   Date:   06/12/2023 MRN:  [id number] DOB: [birth year]    This 63 y.o. female presents to the clinic today for 1 month follow-up status post whole breast radiation for stage Ia (pT1 cN0 M0) ER/PR positive invasive mammary carcinoma.SABRA  REFERRING PROVIDER: Ricard Tawni KIDD, MD  HPI: Patient is a 63 year old female now out 1 month having completed whole breast radiation to her right breast for stage Ia ER/PR positive invasive mammary carcinoma.  She developed a large seroma postoperatively which was drained and then she underwent whole breast radiation seen today in follow-up she is doing well specifically denies breast tenderness cough or bone pain.  She has asked some significant hyperpigmentation of the skin.  She has been prescribed endocrine therapy although has not filled that prescription..  COMPLICATIONS OF TREATMENT: none  FOLLOW UP COMPLIANCE: keeps appointments   PHYSICAL EXAM:  BP (!) 188/75 Comment: Patient will monitor at home and notify PCP if it remains elevated  Pulse 73   Temp 98 F (36.7 C) (Tympanic)   Wt 132 lb (59.9 kg)   LMP 11/18/1996 (Approximate)   BMI 24.94 kg/m  Right breast is extremely hyperpigmented.  I have assured her over time this will resolve.  No dominant masses noted in either breast no axillary or supraclavicular adenopathy is identified.  Well-developed well-nourished patient in NAD. HEENT reveals PERLA, EOMI, discs not visualized.  Oral cavity is clear. No oral mucosal lesions are identified. Neck is clear without evidence of cervical or supraclavicular adenopathy. Lungs are clear to A&P. Cardiac examination is essentially unremarkable with regular rate and rhythm without murmur rub or thrill. Abdomen is benign with no organomegaly or masses noted. Motor sensory and DTR levels are equal and symmetric in the upper and lower extremities. Cranial nerves II through XII are grossly intact.  Proprioception is intact. No peripheral adenopathy or edema is identified. No motor or sensory levels are noted. Crude visual fields are within normal range.  RADIOLOGY RESULTS: No current films for review  PLAN: Present time patient is doing well 1 month out from whole breast radiation I assured her the hyperpigmentation over time will subside.  Of asked to see her back in 6 months for follow-up.  She will be starting endocrine therapy.  Patient knows to call with any concerns.  I would like to take this opportunity to thank you for allowing me to participate in the care of your patient.SABRA Felicia Penton, MD

## 2023-06-15 ENCOUNTER — Telehealth: Payer: Self-pay | Admitting: *Deleted

## 2023-06-15 NOTE — Telephone Encounter (Signed)
A Dr. With dialysis here in [place] has a mutual patient of Felicia Butler now date of birth 07-22-1960 and she is wanting to have your opinion around if she would be safe to give her ESA at their office. The doctor called with [phone] today if you have time. Dr Cathie Hoops sent mess, that she will call the doctor

## 2023-06-20 ENCOUNTER — Ambulatory Visit (INDEPENDENT_AMBULATORY_CARE_PROVIDER_SITE_OTHER): Payer: 59 | Admitting: Nurse Practitioner

## 2023-06-21 ENCOUNTER — Inpatient Hospital Stay: Payer: 59 | Admitting: Occupational Therapy

## 2023-07-03 ENCOUNTER — Ambulatory Visit (INDEPENDENT_AMBULATORY_CARE_PROVIDER_SITE_OTHER): Payer: 59 | Admitting: Nurse Practitioner

## 2023-07-03 ENCOUNTER — Encounter (INDEPENDENT_AMBULATORY_CARE_PROVIDER_SITE_OTHER): Payer: Self-pay

## 2023-07-13 ENCOUNTER — Other Ambulatory Visit: Payer: Self-pay | Admitting: Family Medicine

## 2023-07-13 DIAGNOSIS — K429 Umbilical hernia without obstruction or gangrene: Secondary | ICD-10-CM

## 2023-08-01 ENCOUNTER — Ambulatory Visit
Admission: RE | Admit: 2023-08-01 | Discharge: 2023-08-01 | Disposition: A | Discharge status: Home / Self Care | Payer: 59 | Source: Ambulatory Visit | Attending: Family Medicine | Admitting: Family Medicine

## 2023-08-01 DIAGNOSIS — K429 Umbilical hernia without obstruction or gangrene: Secondary | ICD-10-CM | POA: Diagnosis present

## 2023-08-08 ENCOUNTER — Ambulatory Visit (INDEPENDENT_AMBULATORY_CARE_PROVIDER_SITE_OTHER): Payer: 59 | Admitting: Podiatry

## 2023-08-08 DIAGNOSIS — M7989 Other specified soft tissue disorders: Secondary | ICD-10-CM

## 2023-08-08 NOTE — Progress Notes (Signed)
 Subjective:  Patient ID: Felicia Butler, female    DOB: Aug 19, 1960,  MRN: [id number]  Chief Complaint  Patient presents with   Foot Pain    Pt stated she has a place on the outside of her left foot that causes her some discomfort she stated that she thought it was a scab at first and scratched it and it started bleeding and then she noticed a small odor coming from it the other day      63 y.o. female presents with the above complaint.  Patient presents with complaint of left soft tissue mass/hemangioma.  She stated it was scabbed at first but it started bleeding when she scratches she wanted get it evaluated she would like to have it removed she has not seen MRIs prior to seeing me.  She does not recall how it happened and just came out of nowhere.  Denies any other acute complaints   Review of Systems: Negative except as noted in the HPI. Denies N/V/F/Ch.  Past Medical History:  Diagnosis Date   (HFpEF) heart failure with preserved ejection fraction (HCC) 12/09/2021   a.) TTE 12/09/2021: EF >55%, no RWMAs, GLS -16.8%, mild LAE, mod pHTN (RVSP 52), G2DD   Anemia associated with chronic renal failure    Aortic atherosclerosis (HCC)    Arthritis    Atherosclerosis of iliac artery    Coronary artery disease    a.) MV 11/28/2019: no sig CAD, no ischemia; b.) MV 09/21/2021: no sig CAD, no ischemia   DDD (degenerative disc disease), lumbar    Dyspnea    ESRD (end stage renal disease) on dialysis [facility])    a.) started HD on M-W-F in 2018   FSGS (focal segmental glomerulosclerosis) 1987   a.) diagnosed by Bx in 1987   GERD (gastroesophageal reflux disease)    Gout    Heart murmur    Hyperlipidemia    Hypertension    Hypothyroidism    Invasive carcinoma of RIGHT breast (HCC) 12/27/2022   a.) CNB of RIGHT breast mass on 12/27/2022 --> invasive mammary carcinoma (cT1b, cN0, cM0); ER/PR (+), HER2/neu (-).   Prolonged QT interval 05/03/2022   a.) ECG 05/03/2022: QTc = 511 ms; b.) ECG  01/13/2023: QTc = 577 ms   Pulmonary HTN (HCC) 12/09/2021   a.) TTE 12/09/2021: RVSP 52 mmHg   Sleep apnea    TB lung, latent    a.) (+) PPD 09/2011. CXR (-). Declined INH due to potential for hepatotoxicity.   TIA (transient ischemic attack)    Umbilical hernia     Current Outpatient Medications:    acetaminophen (TYLENOL) 500 MG tablet, Take 1,000 mg by mouth every 6 (six) hours as needed., Disp: , Rfl:    amLODipine (NORVASC) 10 MG tablet, Take 10 mg by mouth daily., Disp: , Rfl:    anastrozole (ARIMIDEX) 1 MG tablet, Take 1 tablet (1 mg total) by mouth daily., Disp: 30 tablet, Rfl: 3   atorvastatin (LIPITOR) 80 MG tablet, Take 80 mg by mouth at bedtime., Disp: , Rfl:    AURYXIA 1 GM 210 MG(Fe) tablet, Take 630 mg by mouth 3 (three) times daily., Disp: , Rfl:    cinacalcet (SENSIPAR) 30 MG tablet, Take 30 mg by mouth daily., Disp: , Rfl:    famotidine (PEPCID) 20 MG tablet, Take 1 tablet (20 mg total) by mouth daily., Disp: 30 tablet, Rfl: 3   hydrALAZINE (APRESOLINE) 50 MG tablet, Take 50 mg by mouth 3 (three) times daily., Disp: , Rfl:  levothyroxine (SYNTHROID, LEVOTHROID) 75 MCG tablet, Take 75 mcg by mouth daily before breakfast., Disp: , Rfl:    losartan (COZAAR) 100 MG tablet, Take 100 mg by mouth daily., Disp: , Rfl:    Methoxy PEG-Epoetin Beta (MIRCERA IJ), Mircera, Disp: , Rfl:    olmesartan (BENICAR) 40 MG tablet, Take by mouth., Disp: , Rfl:    oxyCODONE (OXY IR/ROXICODONE) 5 MG immediate release tablet, Take 1 tablet (5 mg total) by mouth every 6 (six) hours as needed for severe pain., Disp: 15 tablet, Rfl: 0   spironolactone (ALDACTONE) 25 MG tablet, Take by mouth., Disp: , Rfl:   Social History   Tobacco Use  Smoking Status Former   Current packs/day: 0.00   Types: Cigarettes   Start date: 05/30/1978   Quit date: 05/31/1983   Years since quitting: 40.2   Passive exposure: Past  Smokeless Tobacco Never    No Known Allergies Objective:  There were no vitals filed  for this visit. There is no height or weight on file to calculate BMI. Constitutional Well developed. Well nourished.  Vascular Dorsalis pedis pulses palpable bilaterally. Posterior tibial pulses palpable bilaterally. Capillary refill normal to all digits.  No cyanosis or clubbing noted. Pedal hair growth normal.  Neurologic Normal speech. Oriented to person, place, and time. Epicritic sensation to light touch grossly present bilaterally.  Dermatologic Left lateral foot hemangioma with soft tissue mass.  Does not appear to be actively bleeding at this time.  Mild pain on palpation.  Orthopedic: Normal joint ROM without pain or crepitus bilaterally. No visible deformities. No bony tenderness.   Radiographs: None Assessment:  No diagnosis found. Plan:  Patient was evaluated and treated and all questions answered.  Left soft tissue mass/hemangioma -All questions and concerns were discussed with the patient in extensive detail Excision of soft tissue mass -Using 1-2 1 mixture 1% lidocaine plain half percent Marcaine plain 6 cc was used to localized in a V-block fashion.  The skin was prepped with iodine solution.  Using scissor and pickup the hemangioma was removed in standard technique no complication noted.  The wound was dressed with Surgicel to stop bleeding as well as 4 x 4 gauze Ace bandage.  All bony prominences were adequately padded.  Encouraged her to do triple antibiotic and a Band-Aid in 2 days.  She states understanding -  No follow-ups on file.

## 2023-09-04 ENCOUNTER — Inpatient Hospital Stay: Payer: 59 | Admitting: Oncology

## 2023-09-04 ENCOUNTER — Inpatient Hospital Stay: Payer: 59 | Attending: Oncology

## 2023-09-05 ENCOUNTER — Encounter: Payer: Self-pay | Admitting: Oncology

## 2023-09-26 ENCOUNTER — Encounter: Payer: Self-pay | Admitting: Cardiology

## 2023-10-17 ENCOUNTER — Encounter (INDEPENDENT_AMBULATORY_CARE_PROVIDER_SITE_OTHER): Payer: Self-pay

## 2023-10-26 ENCOUNTER — Other Ambulatory Visit: Payer: Self-pay | Admitting: Oncology

## 2023-11-09 ENCOUNTER — Ambulatory Visit
Admission: RE | Admit: 2023-11-09 | Discharge: 2023-11-09 | Disposition: A | Discharge status: Home / Self Care | Payer: 59 | Source: Ambulatory Visit | Attending: Radiation Oncology | Admitting: Radiation Oncology

## 2023-11-09 ENCOUNTER — Encounter: Payer: Self-pay | Admitting: Radiation Oncology

## 2023-11-09 VITALS — BP 135/64 | HR 80 | Temp 97.8°F | Resp 16 | Wt 131.0 lb

## 2023-11-09 DIAGNOSIS — Z170 Estrogen receptor positive status [ER+]: Secondary | ICD-10-CM | POA: Diagnosis not present

## 2023-11-09 DIAGNOSIS — Z923 Personal history of irradiation: Secondary | ICD-10-CM | POA: Insufficient documentation

## 2023-11-09 DIAGNOSIS — Z79811 Long term (current) use of aromatase inhibitors: Secondary | ICD-10-CM | POA: Diagnosis not present

## 2023-11-09 DIAGNOSIS — C50411 Malignant neoplasm of upper-outer quadrant of right female breast: Secondary | ICD-10-CM | POA: Insufficient documentation

## 2023-11-09 NOTE — Progress Notes (Signed)
 Radiation Oncology Follow up Note  Name: Felicia Butler   Date:   11/09/2023 MRN:  [id number] DOB: 28-Nov-1960    This 63 y.o. female presents to the clinic today for 40-month follow-up status post whole breast radiation to her right breast for stage Ia (pT1 cN0 M0) ER/PR positive invasive mammary carcinoma.  REFERRING PROVIDER: Almetta Armor, MD  HPI: Patient is a 63 year old female now out 7 months clinical breast radiation to her right breast for stage Ia ER/PR positive invasive mammary carcinoma.  Seen today in routine follow-up she is doing well.  She specifically denies breast tenderness cough or bone pain.  She is being worked up for any hernia which may need surgical repair..  She is currently on Arimidex  tolerating well without side effect.  COMPLICATIONS OF TREATMENT: none  FOLLOW UP COMPLIANCE: keeps appointments   PHYSICAL EXAM:  BP 135/64   Pulse 80   Temp 97.8 F (36.6 C) (Tympanic)   Resp 16   Wt 131 lb (59.4 kg)   LMP 11/18/1996 (Approximate)   BMI 24.75 kg/m  Macrobid exam patient still has an hyperpigmentation of the skin which I am sure will resolve over time.  Well-developed well-nourished patient in NAD. HEENT reveals PERLA, EOMI, discs not visualized.  Oral cavity is clear. No oral mucosal lesions are identified. Neck is clear without evidence of cervical or supraclavicular adenopathy. Lungs are clear to A&P. Cardiac examination is essentially unremarkable with regular rate and rhythm without murmur rub or thrill. Abdomen is benign with no organomegaly or masses noted. Motor sensory and DTR levels are equal and symmetric in the upper and lower extremities. Cranial nerves II through XII are grossly intact. Proprioception is intact. No peripheral adenopathy or edema is identified. No motor or sensory levels are noted. Crude visual fields are within normal range.  RADIOLOGY RESULTS: No current films to review  PLAN: Present patient is doing well 7 months out from  whole breast radiation.  Cosmetically doing well except for some persistent hyperpigmentation of the skin.  I have asked to see her back in 6 months for follow-up.  Will review mammograms when the become available.  She continues on Arimidex  without side effect.  I would like to take this opportunity to thank you for allowing me to participate in the care of your patient.Glenis Langdon, MD

## 2023-11-22 ENCOUNTER — Ambulatory Visit: Admitting: Oncology

## 2023-11-22 ENCOUNTER — Other Ambulatory Visit

## 2023-11-27 ENCOUNTER — Inpatient Hospital Stay: Attending: Oncology

## 2023-11-27 ENCOUNTER — Inpatient Hospital Stay (HOSPITAL_BASED_OUTPATIENT_CLINIC_OR_DEPARTMENT_OTHER): Admitting: Oncology

## 2023-11-27 ENCOUNTER — Encounter: Payer: Self-pay | Admitting: Oncology

## 2023-11-27 VITALS — BP 149/67 | HR 76 | Temp 98.7°F | Resp 18 | Wt 129.6 lb

## 2023-11-27 DIAGNOSIS — N186 End stage renal disease: Secondary | ICD-10-CM

## 2023-11-27 DIAGNOSIS — Z79811 Long term (current) use of aromatase inhibitors: Secondary | ICD-10-CM | POA: Diagnosis not present

## 2023-11-27 DIAGNOSIS — Z170 Estrogen receptor positive status [ER+]: Secondary | ICD-10-CM | POA: Insufficient documentation

## 2023-11-27 DIAGNOSIS — Z1732 Human epidermal growth factor receptor 2 negative status: Secondary | ICD-10-CM | POA: Diagnosis not present

## 2023-11-27 DIAGNOSIS — Z1721 Progesterone receptor positive status: Secondary | ICD-10-CM | POA: Insufficient documentation

## 2023-11-27 DIAGNOSIS — I5032 Chronic diastolic (congestive) heart failure: Secondary | ICD-10-CM | POA: Insufficient documentation

## 2023-11-27 DIAGNOSIS — Z79899 Other long term (current) drug therapy: Secondary | ICD-10-CM | POA: Insufficient documentation

## 2023-11-27 DIAGNOSIS — D631 Anemia in chronic kidney disease: Secondary | ICD-10-CM | POA: Diagnosis not present

## 2023-11-27 DIAGNOSIS — Z992 Dependence on renal dialysis: Secondary | ICD-10-CM

## 2023-11-27 DIAGNOSIS — C50411 Malignant neoplasm of upper-outer quadrant of right female breast: Secondary | ICD-10-CM | POA: Diagnosis present

## 2023-11-27 DIAGNOSIS — Z809 Family history of malignant neoplasm, unspecified: Secondary | ICD-10-CM | POA: Diagnosis not present

## 2023-11-27 DIAGNOSIS — Z87891 Personal history of nicotine dependence: Secondary | ICD-10-CM | POA: Insufficient documentation

## 2023-11-27 DIAGNOSIS — I132 Hypertensive heart and chronic kidney disease with heart failure and with stage 5 chronic kidney disease, or end stage renal disease: Secondary | ICD-10-CM | POA: Diagnosis not present

## 2023-11-27 DIAGNOSIS — C50919 Malignant neoplasm of unspecified site of unspecified female breast: Secondary | ICD-10-CM | POA: Diagnosis not present

## 2023-11-27 LAB — CMP (CANCER CENTER ONLY)
ALT: 10 U/L (ref 0–44)
AST: 18 U/L (ref 15–41)
Albumin: 3.6 g/dL (ref 3.5–5.0)
Alkaline Phosphatase: 132 U/L — ABNORMAL HIGH (ref 38–126)
Anion gap: 13 (ref 5–15)
BUN: 24 mg/dL — ABNORMAL HIGH (ref 8–23)
CO2: 31 mmol/L (ref 22–32)
Calcium: 9.3 mg/dL (ref 8.9–10.3)
Chloride: 94 mmol/L — ABNORMAL LOW (ref 98–111)
Creatinine: 4.69 mg/dL — ABNORMAL HIGH (ref 0.44–1.00)
GFR, Estimated: 10 mL/min — ABNORMAL LOW (ref >60)
Glucose, Bld: 83 mg/dL (ref 70–99)
Potassium: 3.5 mmol/L (ref 3.5–5.1)
Sodium: 138 mmol/L (ref 135–145)
Total Bilirubin: 0.8 mg/dL (ref 0.0–1.2)
Total Protein: 7.6 g/dL (ref 6.5–8.1)

## 2023-11-27 LAB — CBC WITH DIFFERENTIAL (CANCER CENTER ONLY)
Abs Immature Granulocytes: 0.02 10*3/uL (ref 0.00–0.07)
Basophils Absolute: 0 10*3/uL (ref 0.0–0.1)
Basophils Relative: 0 %
Eosinophils Absolute: 0.1 10*3/uL (ref 0.0–0.5)
Eosinophils Relative: 1 %
HCT: 29.9 % — ABNORMAL LOW (ref 36.0–46.0)
Hemoglobin: 9.9 g/dL — ABNORMAL LOW (ref 12.0–15.0)
Immature Granulocytes: 0 %
Lymphocytes Relative: 14 %
Lymphs Abs: 0.7 10*3/uL (ref 0.7–4.0)
MCH: 28 pg (ref 26.0–34.0)
MCHC: 33.1 g/dL (ref 30.0–36.0)
MCV: 84.5 fL (ref 80.0–100.0)
Monocytes Absolute: 0.6 10*3/uL (ref 0.1–1.0)
Monocytes Relative: 12 %
Neutro Abs: 3.5 10*3/uL (ref 1.7–7.7)
Neutrophils Relative %: 73 %
Platelet Count: 155 10*3/uL (ref 150–400)
RBC: 3.54 MIL/uL — ABNORMAL LOW (ref 3.87–5.11)
RDW: 15 % (ref 11.5–15.5)
WBC Count: 4.9 10*3/uL (ref 4.0–10.5)
nRBC: 0 % (ref 0.0–0.2)

## 2023-11-27 MED ORDER — ANASTROZOLE 1 MG PO TABS
1.0000 mg | ORAL_TABLET | Freq: Every day | ORAL | 1 refills | Status: AC
Start: 2023-11-27 — End: ?

## 2023-11-27 NOTE — Assessment & Plan Note (Addendum)
 Anemia due to chronic kidney disease.  Managed by nephrologist through dialysis.

## 2023-11-27 NOTE — Assessment & Plan Note (Addendum)
 Pathology was discussed with the patient, pT1c pN0  ER+ PR+ HER2 negative Oncotype Dx recurrence score 3, no benefit of chemotherapy.  S/p djuvant radiation - she has appt with Radonc Recommend Adjuvant antiestrogen therapy, she is post menopausal 03/21/23 DEXA normal Patient tolerates Arimidex  1 mg daily.  Continue.  Plan for 5 years - till Jan 2030 Obtain annual mammogram July 2025

## 2023-11-27 NOTE — Progress Notes (Signed)
 Hematology/Oncology Progress note Telephone:([phone] Fax:(336) [phone]      CHIEF COMPLAINTS/PURPOSE OF CONSULTATION:  Right breast invasive carcinoma, ER PR positive, HER2 negative  ASSESSMENT & PLAN:   Cancer Staging  Invasive carcinoma of breast (HCC) Staging form: Breast, AJCC 8th Edition - Clinical stage from 01/03/2023: cT1b, cN0, cM0, ER+, PR+, HER2- - Signed by Babara Call, MD on 01/03/2023   Invasive carcinoma of breast [facility]) Pathology was discussed with the patient, pT1c pN0  ER+ PR+ HER2 negative Oncotype Dx recurrence score 3, no benefit of chemotherapy.  S/p djuvant radiation - she has appt with Radonc Recommend Adjuvant antiestrogen therapy, she is post menopausal 03/21/23 DEXA normal Patient tolerates Arimidex  1 mg daily.  Continue.  Plan for 5 years - till Jan 2030 Obtain annual mammogram July 2025  Anemia in chronic kidney disease Anemia due to chronic kidney disease.  Managed by nephrologist through dialysis.   Family history of cancer Genetic testing is negative.    Orders Placed This Encounter  Procedures   MM 3D DIAGNOSTIC MAMMOGRAM BILATERAL BREAST    Standing Status:   Future    Expected Date:   12/12/2023    Expiration Date:   11/26/2024    Reason for Exam (SYMPTOM  OR DIAGNOSIS REQUIRED):   breast cancer follow up    Preferred imaging location?:   [facility]   US  LIMITED ULTRASOUND INCLUDING AXILLA RIGHT BREAST    Standing Status:   Future    Expected Date:   12/13/2023    Expiration Date:   11/26/2024    Reason for Exam (SYMPTOM  OR DIAGNOSIS REQUIRED):   breast cancer    Preferred imaging location?:   [facility]   CMP (Cancer Center only)    Standing Status:   Future    Expected Date:   05/28/2024    Expiration Date:   08/26/2024   CBC with Differential (Cancer Center Only)    Standing Status:   Future    Expected Date:   05/28/2024    Expiration Date:   08/26/2024   Follow-up 6 months All questions were answered. The patient  knows to call the clinic with any problems, questions or concerns.  [name], MD, PhD [facility] Hematology Oncology 11/27/2023    HISTORY OF PRESENTING ILLNESS:  Felicia Butler 63 y.o. female presents to establish care for right breast cancer I have reviewed her chart and materials related to her cancer extensively and collaborated history with the patient. Summary of oncologic history is as follows: Oncology History  Invasive carcinoma of breast (HCC)  12/07/2022 Mammogram   Bilateral screening mammogram showed possible right breast distortion   12/13/2022 Mammogram   Unilateral right diagnostic mammogram showed right breast 10 mm irregular mass in the 10 o'clock position suspicious for malignancy.  Right axillary ultrasound was performed and demonstrated morphologically benign lymph nodes.  No right axillary lymphadenopathy.   12/27/2022 Initial Diagnosis   Invasive carcinoma of breast (HCC)  Breast, right, needle core biopsy, 10 ocl, 7cmfn, mass - INVASIVE MAMMARY CARCINOMA WITH TUBULAR FEATURES, SEE NOTE - DUCTAL CARCINOMA IN SITU (DCIS): PRESENT, FOCAL, GRADE 2 - TUBULE FORMATION: SCORE 1 - NUCLEAR PLEOMORPHISM: SCORE 2 - MITOTIC COUNT: SCORE 1 - TOTAL SCORE: 4 - OVERALL GRADE: 1 - LYMPHOVASCULAR INVASION: NOT IDENTIFIED - CANCER LENGTH: 0.6 CM - CALCIFICATIONS: PRESENT, FOCAL  ER 100%+, PR 100%, HER2 negative.  (1+)   01/03/2023 Cancer Staging   Staging form: Breast, AJCC 8th Edition - Clinical stage from 01/03/2023:  cT1b, cN0, cM0, ER+, PR+, HER2- - Signed by Babara Call, MD on 01/03/2023 Stage prefix: Initial diagnosis    Genetic Testing   Negative genetic testing. No pathogenic variants identified on the [facility] Common Hereditary Cancers+RNA panel. The report date is 01/18/2023.  The Common Hereditary Cancers Panel + RNA offered by [facility] includes sequencing and/or deletion duplication testing of the following 48 genes: APC*, ATM*, AXIN2, BAP1, BARD1, BMPR1A, BRCA1, BRCA2,  BRIP1, CDH1, CDK4, CDKN2A (p14ARF), CDKN2A (p16INK4a), CHEK2, CTNNA1, DICER1*, EPCAM*, FH*, GREM1*, HOXB13, KIT, MBD4, MEN1*, MLH1*, MSH2*, MSH3*, MSH6*, MUTYH, NF1*, NTHL1, PALB2, PDGFRA, PMS2*, POLD1*, POLE, PTEN*, RAD51C, RAD51D, SDHA*, SDHB, SDHC*, SDHD, SMAD4, SMARCA4, STK11, TP53, TSC1*, TSC2, VHL.    01/18/2023 Surgery   Pt underwent right lumpectomy and SLNB  1. 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 - SEE CANCER SUMMARY BELOW. - BIOPSY SITE CHANGE WITH CLIP. - INTRADUCTAL PAPILLOMA WITH FLORID DUCTAL HYPERPLASIA, COLUMNAR CELL CHANGE, CYST FORMATION, AND ATYPICAL LOBULAR HYPERPLASIA. 2. Lymph node, sentinel, biopsy, #1 Right - ONE LYMPH NODE NEGATIVE FOR MALIGNANCY (0/1). 3. Lymph node, sentinel, biopsy, #2 Right - ONE LYMPH NODE NEGATIVE FOR MALIGNANCY (0/1). 4. Lymph node, sentinel, biopsy, #3 Right - ONE LYMPH NODE NEGATIVE FOR MALIGNANCY (0/1).   02/06/2023 Oncotype testing   Oncotype Dx 3, distant recurrence risk at 9 years 3%, benefit from chemotherapy <1%    - 05/10/2023 Radiation Therapy   S/p adjuvant radiaiton     Menarche at age of 56 First live birth at age of 38 OCP use: Less than 5 years History of hysterectomy: 1990s. Menopausal status: Probably postmenopausal History of HRT use: No History of chest radiation: No Number of previous breast biopsies: 2014 bilateral breast biopsy, papillomas. Family history: No breast cancer.  Mother endometrial cancer, maternal cousin pancreatic cancer  Patient has ESRD on hemodialysis. Today she presents for follow up. S/p right breast lumpectomy and SLNB + s/p radiation.  She feels well today. No new complaints.  She tolerates Arimidex .   MEDICAL HISTORY:  Past Medical History:  Diagnosis Date   (HFpEF) heart failure with preserved ejection fraction (HCC) 12/09/2021   a.) TTE 12/09/2021: EF >55%, no RWMAs, GLS -16.8%, mild LAE, mod pHTN (RVSP 52), G2DD    Anemia associated with chronic renal failure    Aortic atherosclerosis (HCC)    Arthritis    Atherosclerosis of iliac artery    Coronary artery disease    a.) MV 11/28/2019: no sig CAD, no ischemia; b.) MV 09/21/2021: no sig CAD, no ischemia   DDD (degenerative disc disease), lumbar    Dyspnea    ESRD (end stage renal disease) on dialysis [facility])    a.) started HD on M-W-F in 2018   FSGS (focal segmental glomerulosclerosis) 1987   a.) diagnosed by Bx in 1987   GERD (gastroesophageal reflux disease)    Gout    Heart murmur    Hyperlipidemia    Hypertension    Hypothyroidism    Invasive carcinoma of RIGHT breast (HCC) 12/27/2022   a.) CNB of RIGHT breast mass on 12/27/2022 --> invasive mammary carcinoma (cT1b, cN0, cM0); ER/PR (+), HER2/neu (-).   Prolonged QT interval 05/03/2022   a.) ECG 05/03/2022: QTc = 511 ms; b.) ECG 01/13/2023: QTc = 577 ms   Pulmonary HTN (HCC) 12/09/2021   a.) TTE 12/09/2021: RVSP 52 mmHg   Sleep apnea    TB lung, latent    a.) (+) PPD 09/2011. CXR (-). Declined  INH due to potential for hepatotoxicity.   TIA (transient ischemic attack)    Umbilical hernia     SURGICAL HISTORY: Past Surgical History:  Procedure Laterality Date   A/V FISTULAGRAM N/A 10/10/2022   Procedure: A/V Fistulagram;  Surgeon: Marea Selinda RAMAN, MD;  Location: [facility] INVASIVE CV LAB;  Service: Cardiovascular;  Laterality: N/A;   A/V FISTULAGRAM N/A 02/01/2022   Procedure: A/V Fistulagram;  Surgeon: Jama Cordella MATSU, MD;  Location: [facility] INVAS[facility] CV [facility];  Service: Cardiovascular;  Laterality: N/A;   A/V FISTULAGRAM Left 11/08/2022   Procedure: A/V Fistulagram;  Surgeon: Jama Cordella MATSU, MD;  Location: [facility];  Service: Cardiovascular;  Laterality: Left;   ABDOMINAL HYSTERECTOMY     AV FISTULA PLACEMENT Left 01/18/2017   Procedure: ARTERIOVENOUS (AV) FISTULA CREATION ( BRACHIAL CEPHALIC );  Surgeon: Jama Cordella MATSU, MD;  Location: [facility] ORS;  Service: Vascular;   Laterality: Left;   BREAST BIOPSY Right 04/30/2013   intraductal papilloma   BREAST BIOPSY Right 04/30/2013   cyst aspiration   BREAST BIOPSY Right 05/27/2013   subareolar duct excision   BREAST BIOPSY Left 05/27/2013   subareolar duct excision   BREAST BIOPSY Left 08/11/2016   benign   BREAST BIOPSY Right 12/27/2022   us  bx/ ribbon clip/ path pending   BREAST BIOPSY Right 12/27/2022   US  RT BREAST BX W LOC DEV 1ST LESION IMG BX SPEC US  GUIDE 12/27/2022 [facility]   BREAST LUMPECTOMY WITH SENTINEL LYMPH NODE BIOPSY Right 01/18/2023   Procedure: BREAST LUMPECTOMY WITH SENTINEL LYMPH NODE BX;  Surgeon: Lane Shope, MD;  Location: [facility] OR[facility];  Service: General;  Laterality: Right;   COLONOSCOPY  06/28/2018   DIALYSIS/PERMA CATHETER INSERTION N/A 02/01/2022   Procedure: DIALYSIS/PERMA CATHETER INSERTION;  Surgeon: Jama Cordella MATSU, MD;  Location: [facility];  Service: Cardiovascular;  Laterality: N/A;   DIALYSIS/PERMA CATHETER REMOVAL N/A 07/28/2022   Procedure: DIALYSIS/PERMA CATHETER REMOVAL;  Surgeon: Marea Selinda RAMAN, MD;  Location: [facility];  Service: Cardiovascular;  Laterality: N/A;   EVACUATION BREAST HEMATOMA Right 03/06/2023   Procedure: EVACUATION HEMATOMA BREAST, seroma;  Surgeon: Lane Shope, MD;  Location: [facility];  Service: General;  Laterality: Right;   HERNIA REPAIR  1960's   PARTIAL HYSTERECTOMY     1980's   PERIPHERAL VASCULAR THROMBECTOMY Left 04/18/2017   Procedure: PERIPHERAL VASCULAR THROMBECTOMY;  Surgeon: Jama Cordella MATSU, MD;  Location: [facility];  Service: Cardiovascular;  Laterality: Left;   REVISION OF ARTERIOVENOUS GORETEX GRAFT Left 10/09/2022   Procedure: REVISION OF ARTERIOVENOUS FISTULA;  Surgeon: Serene Gaile ORN, MD;  Location: [facility];  Service: Vascular;  Laterality: Left;   REVISON OF ARTERIOVENOUS FISTULA Left 05/06/2022   Procedure: REVISON OF ARTERIOVENOUS FISTULA (BRACHIAL CEPHALIC);  Surgeon:  Jama Cordella MATSU, MD;  Location: [facility];  Service: Vascular;  Laterality: Left;    SOCIAL HISTORY: Social History   Socioeconomic History   Marital status: Divorced    Spouse name: Not on file   Number of children: 1   Years of education: Not on file   Highest education level: Not on file  Occupational History   Not on file  Tobacco Use   Smoking status: Former    Current packs/day: 0.00    Types: Cigarettes    Start date: 05/30/1978    Quit date: 05/31/1983    Years since quitting: 40.5    Passive exposure: Past   Smokeless tobacco: Never  Vaping Use   Vaping  status: Never Used  Substance and Sexual Activity   Alcohol use: No   Drug use: No   Sexual activity: Not on file  Other Topics Concern   Not on file  Social History Narrative   Lives with sister and son   Social Drivers of Health   Financial Resource Strain: Low Risk  (06/05/2023)   Overall Financial Resource Strain (CARDIA)    Difficulty of Paying Living Expenses: Not very hard  Food Insecurity: No Food Insecurity (01/03/2023)   Hunger Vital Sign    Worried About Running Out of Food in the Last Year: Never true    Ran Out of Food in the Last Year: Never true  Transportation Needs: No Transportation Needs (01/03/2023)   PRAPARE - Administrator, Civil Service (Medical): No    Lack of Transportation (Non-Medical): No  Recent Concern: Transportation Needs - Unmet Transportation Needs (10/09/2022)   PRAPARE - Administrator, Civil Service (Medical): Yes    Lack of Transportation (Non-Medical): Yes  Physical Activity: Not on file  Stress: Not on file  Social Connections: Not on file  Intimate Partner Violence: Not At Risk (01/03/2023)   Humiliation, Afraid, Rape, and Kick questionnaire    Fear of Current or Ex-Partner: No    Emotionally Abused: No    Physically Abused: No    Sexually Abused: No    FAMILY HISTORY: Family History  Problem Relation Age of Onset   Uterine cancer Mother 64    Hypertension Father    Diabetes Father    Stroke Father    Kidney failure Father    Cancer Maternal Grandmother        unk type   Pancreatic cancer Cousin    Pancreatic cancer Cousin    Breast cancer Neg Hx     ALLERGIES:  has no known allergies.  MEDICATIONS:  Current Outpatient Medications  Medication Sig Dispense Refill   acetaminophen  (TYLENOL ) 500 MG tablet Take 1,000 mg by mouth every 6 (six) hours as needed.     amLODipine  (NORVASC ) 10 MG tablet Take 10 mg by mouth daily.     atorvastatin  (LIPITOR) 80 MG tablet Take 80 mg by mouth at bedtime.     AURYXIA  1 GM 210 MG(Fe) tablet Take 630 mg by mouth 3 (three) times daily.     cinacalcet  (SENSIPAR ) 30 MG tablet Take 30 mg by mouth daily.     famotidine  (PEPCID ) 20 MG tablet Take 1 tablet (20 mg total) by mouth daily. 30 tablet 3   hydrALAZINE  (APRESOLINE ) 50 MG tablet Take 50 mg by mouth 3 (three) times daily.     levothyroxine  (SYNTHROID , LEVOTHROID) 75 MCG tablet Take 75 mcg by mouth daily before breakfast.     losartan  (COZAAR ) 100 MG tablet Take 100 mg by mouth daily.     olmesartan (BENICAR) 40 MG tablet Take by mouth.     spironolactone (ALDACTONE) 25 MG tablet Take by mouth.     anastrozole  (ARIMIDEX ) 1 MG tablet Take 1 tablet (1 mg total) by mouth daily. 90 tablet 1   oxyCODONE  (OXY IR/ROXICODONE ) 5 MG immediate release tablet Take 1 tablet (5 mg total) by mouth every 6 (six) hours as needed for severe pain. 15 tablet 0   No current facility-administered medications for this visit.    Review of Systems  Constitutional:  Positive for fatigue. Negative for appetite change, chills and fever.  HENT:   Negative for hearing loss and voice change.  Eyes:  Negative for eye problems.  Respiratory:  Negative for chest tightness and cough.   Cardiovascular:  Negative for chest pain.  Gastrointestinal:  Negative for abdominal distention, abdominal pain and blood in stool.  Endocrine: Negative for hot flashes.   Genitourinary:  Negative for difficulty urinating and frequency.   Musculoskeletal:  Negative for arthralgias.  Skin:  Negative for itching and rash.  Neurological:  Negative for extremity weakness.  Hematological:  Negative for adenopathy.  Psychiatric/Behavioral:  Negative for confusion.      PHYSICAL EXAMINATION: ECOG PERFORMANCE STATUS: 0 - Asymptomatic  Vitals:   11/27/23 1426  BP: (!) 149/67  Pulse: 76  Resp: 18  Temp: 98.7 F (37.1 C)  SpO2: 98%   Filed Weights   11/27/23 1426  Weight: 129 lb 9.6 oz (58.8 kg)    Physical Exam Constitutional:      General: She is not in acute distress.    Appearance: She is not diaphoretic.  HENT:     Head: Normocephalic and atraumatic.   Eyes:     General: No scleral icterus.   Cardiovascular:     Rate and Rhythm: Normal rate.  Pulmonary:     Effort: Pulmonary effort is normal. No respiratory distress.  Abdominal:     General: There is no distension.   Musculoskeletal:        General: Normal range of motion.     Cervical back: Normal range of motion and neck supple.   Skin:    Findings: No erythema.   Neurological:     Mental Status: She is alert and oriented to person, place, and time. Mental status is at baseline.     Motor: No abnormal muscle tone.   Psychiatric:        Mood and Affect: Mood and affect normal.     LABORATORY DATA:  I have reviewed the data as listed    Latest Ref Rng & Units 11/27/2023    2:13 PM 06/05/2023   11:45 AM 05/10/2023    1:02 PM  CBC  WBC 4.0 - 10.5 K/uL 4.9  4.0  5.2   Hemoglobin 12.0 - 15.0 g/dL 9.9  8.5  9.7   Hematocrit 36.0 - 46.0 % 29.9  26.1  29.5   Platelets 150 - 400 K/uL 155  154  143       Latest Ref Rng & Units 11/27/2023    2:12 PM 03/06/2023   11:25 AM 01/18/2023    9:54 AM  CMP  Glucose 70 - 99 mg/dL 83  90  83   BUN 8 - 23 mg/dL 24  35  35   Creatinine 0.44 - 1.00 mg/dL 5.30  0.19  1.59   Sodium 135 - 145 mmol/L 138  137  139   Potassium 3.5 - 5.1  mmol/L 3.5  3.0  3.2   Chloride 98 - 111 mmol/L 94  98  99   CO2 22 - 32 mmol/L 31     Calcium  8.9 - 10.3 mg/dL 9.3     Total Protein 6.5 - 8.1 g/dL 7.6     Total Bilirubin 0.0 - 1.2 mg/dL 0.8     Alkaline Phos 38 - 126 U/L 132     AST 15 - 41 U/L 18     ALT 0 - 44 U/L 10        RADIOGRAPHIC STUDIES: I have personally reviewed the radiological images as listed and agreed with the findings in the report. No results  found.

## 2023-11-27 NOTE — Assessment & Plan Note (Signed)
Genetic testing is negative.  

## 2024-05-09 ENCOUNTER — Ambulatory Visit: Admitting: Radiation Oncology

## 2024-06-04 ENCOUNTER — Telehealth: Payer: Self-pay | Admitting: Oncology

## 2024-06-04 ENCOUNTER — Encounter: Payer: Self-pay | Admitting: Oncology

## 2024-06-04 ENCOUNTER — Inpatient Hospital Stay

## 2024-06-04 ENCOUNTER — Inpatient Hospital Stay: Admitting: Oncology

## 2024-06-04 NOTE — Telephone Encounter (Signed)
 Pt called to r/s missed appt from today - r/s appt w/pt - LH

## 2024-07-22 ENCOUNTER — Inpatient Hospital Stay

## 2024-07-22 ENCOUNTER — Inpatient Hospital Stay: Admitting: Oncology
